# Patient Record
Sex: Female | Born: 1965 | Race: White | Hispanic: No | Marital: Married | Smoking: Never smoker
Health system: Southern US, Community
[De-identification: ages and names within clinical notes are randomized; demographics above are authoritative.]

## PROBLEM LIST (undated history)

## (undated) DIAGNOSIS — N390 Urinary tract infection, site not specified: Secondary | ICD-10-CM

## (undated) DIAGNOSIS — N8110 Cystocele, unspecified: Secondary | ICD-10-CM

## (undated) DIAGNOSIS — K5790 Diverticulosis of intestine, part unspecified, without perforation or abscess without bleeding: Secondary | ICD-10-CM

## (undated) DIAGNOSIS — I10 Essential (primary) hypertension: Secondary | ICD-10-CM

## (undated) DIAGNOSIS — N816 Rectocele: Secondary | ICD-10-CM

## (undated) DIAGNOSIS — T753XXA Motion sickness, initial encounter: Secondary | ICD-10-CM

## (undated) DIAGNOSIS — F32A Depression, unspecified: Secondary | ICD-10-CM

## (undated) DIAGNOSIS — M5134 Other intervertebral disc degeneration, thoracic region: Secondary | ICD-10-CM

## (undated) DIAGNOSIS — Z9889 Other specified postprocedural states: Secondary | ICD-10-CM

## (undated) DIAGNOSIS — R112 Nausea with vomiting, unspecified: Secondary | ICD-10-CM

## (undated) DIAGNOSIS — F419 Anxiety disorder, unspecified: Secondary | ICD-10-CM

## (undated) DIAGNOSIS — G894 Chronic pain syndrome: Secondary | ICD-10-CM

## (undated) DIAGNOSIS — D649 Anemia, unspecified: Secondary | ICD-10-CM

## (undated) DIAGNOSIS — E782 Mixed hyperlipidemia: Secondary | ICD-10-CM

## (undated) DIAGNOSIS — K219 Gastro-esophageal reflux disease without esophagitis: Secondary | ICD-10-CM

## (undated) DIAGNOSIS — M47814 Spondylosis without myelopathy or radiculopathy, thoracic region: Secondary | ICD-10-CM

## (undated) DIAGNOSIS — Z973 Presence of spectacles and contact lenses: Secondary | ICD-10-CM

## (undated) DIAGNOSIS — M47816 Spondylosis without myelopathy or radiculopathy, lumbar region: Secondary | ICD-10-CM

## (undated) HISTORY — DX: Urinary tract infection, site not specified: N39.0

## (undated) HISTORY — DX: Anxiety disorder, unspecified: F41.9

## (undated) HISTORY — PX: ABDOMINAL HYSTERECTOMY: SHX81

## (undated) HISTORY — DX: Gastro-esophageal reflux disease without esophagitis: K21.9

## (undated) HISTORY — PX: BREAST ENHANCEMENT SURGERY: SHX7

## (undated) HISTORY — DX: Diverticulosis of intestine, part unspecified, without perforation or abscess without bleeding: K57.90

---
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
## 1992-05-24 HISTORY — PX: BILATERAL CARPAL TUNNEL RELEASE: SHX6508

## 1998-11-11 ENCOUNTER — Encounter: Admission: RE | Admit: 1998-11-11 | Discharge: 1999-02-02 | Payer: Self-pay | Admitting: Anesthesiology

## 1999-01-15 ENCOUNTER — Ambulatory Visit (HOSPITAL_COMMUNITY): Admission: RE | Admit: 1999-01-15 | Discharge: 1999-01-15 | Payer: Self-pay | Admitting: *Deleted

## 1999-01-15 ENCOUNTER — Encounter: Payer: Self-pay | Admitting: *Deleted

## 1999-01-17 ENCOUNTER — Ambulatory Visit (HOSPITAL_COMMUNITY): Admission: RE | Admit: 1999-01-17 | Discharge: 1999-01-17 | Payer: Self-pay | Admitting: *Deleted

## 1999-01-17 ENCOUNTER — Encounter: Payer: Self-pay | Admitting: *Deleted

## 1999-02-02 ENCOUNTER — Ambulatory Visit (HOSPITAL_COMMUNITY): Admission: RE | Admit: 1999-02-02 | Discharge: 1999-02-02 | Payer: Self-pay | Admitting: *Deleted

## 1999-02-02 ENCOUNTER — Encounter: Payer: Self-pay | Admitting: *Deleted

## 1999-05-25 HISTORY — PX: SPINE SURGERY: SHX786

## 2000-02-22 ENCOUNTER — Encounter: Payer: Self-pay | Admitting: *Deleted

## 2000-02-24 ENCOUNTER — Encounter: Payer: Self-pay | Admitting: *Deleted

## 2000-02-24 ENCOUNTER — Ambulatory Visit (HOSPITAL_COMMUNITY): Admission: RE | Admit: 2000-02-24 | Discharge: 2000-02-24 | Payer: Self-pay | Admitting: *Deleted

## 2000-04-04 ENCOUNTER — Encounter: Payer: Self-pay | Admitting: *Deleted

## 2000-04-04 ENCOUNTER — Ambulatory Visit (HOSPITAL_COMMUNITY): Admission: RE | Admit: 2000-04-04 | Discharge: 2000-04-05 | Payer: Self-pay | Admitting: *Deleted

## 2000-04-04 ENCOUNTER — Encounter (INDEPENDENT_AMBULATORY_CARE_PROVIDER_SITE_OTHER): Payer: Self-pay | Admitting: *Deleted

## 2000-05-19 ENCOUNTER — Encounter: Payer: Self-pay | Admitting: *Deleted

## 2000-05-19 ENCOUNTER — Ambulatory Visit (HOSPITAL_COMMUNITY): Admission: RE | Admit: 2000-05-19 | Discharge: 2000-05-19 | Payer: Self-pay | Admitting: *Deleted

## 2002-05-24 HISTORY — PX: PARTIAL HYSTERECTOMY: SHX80

## 2005-05-24 HISTORY — PX: ABDOMINOPLASTY: SUR9

## 2006-05-24 HISTORY — PX: AUGMENTATION MAMMAPLASTY: SUR837

## 2008-07-31 ENCOUNTER — Ambulatory Visit (HOSPITAL_COMMUNITY): Admission: RE | Admit: 2008-07-31 | Discharge: 2008-07-31 | Payer: Self-pay | Admitting: Plastic Surgery

## 2008-07-31 ENCOUNTER — Encounter (INDEPENDENT_AMBULATORY_CARE_PROVIDER_SITE_OTHER): Payer: Self-pay | Admitting: Plastic Surgery

## 2008-07-31 ENCOUNTER — Ambulatory Visit: Payer: Self-pay | Admitting: Vascular Surgery
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
## 2011-01-22 ENCOUNTER — Inpatient Hospital Stay (HOSPITAL_COMMUNITY)
Admission: EM | Admit: 2011-01-22 | Discharge: 2011-01-26 | DRG: 603 | Disposition: A | Discharge status: Home / Self Care | Payer: Self-pay | Attending: Plastic Surgery | Admitting: Plastic Surgery

## 2011-01-22 ENCOUNTER — Emergency Department (HOSPITAL_COMMUNITY): Payer: Self-pay

## 2011-01-22 DIAGNOSIS — L0231 Cutaneous abscess of buttock: Secondary | ICD-10-CM | POA: Diagnosis present

## 2011-01-22 DIAGNOSIS — L02419 Cutaneous abscess of limb, unspecified: Principal | ICD-10-CM | POA: Diagnosis present

## 2011-01-22 LAB — BASIC METABOLIC PANEL
BUN: 10 mg/dL (ref 6–23)
CO2: 21 mEq/L (ref 19–32)
Calcium: 8.4 mg/dL (ref 8.4–10.5)
Chloride: 98 mEq/L (ref 96–112)
Creatinine, Ser: 0.66 mg/dL (ref 0.50–1.10)
GFR calc Af Amer: >60 mL/min (ref >60)
GFR calc non Af Amer: >60 mL/min (ref >60)
Glucose, Bld: 146 mg/dL — ABNORMAL HIGH (ref 70–99)
Potassium: 3.3 mEq/L — ABNORMAL LOW (ref 3.5–5.1)
Sodium: 130 mEq/L — ABNORMAL LOW (ref 135–145)

## 2011-01-22 LAB — CBC
HCT: 35.3 % — ABNORMAL LOW (ref 36.0–46.0)
Hemoglobin: 12.5 g/dL (ref 12.0–15.0)
MCH: 29.2 pg (ref 26.0–34.0)
MCHC: 35.4 g/dL (ref 30.0–36.0)
MCV: 82.5 fL (ref 78.0–100.0)
Platelets: 232 10*3/uL (ref 150–400)
RBC: 4.28 MIL/uL (ref 3.87–5.11)
RDW: 13.4 % (ref 11.5–15.5)
WBC: 13.7 10*3/uL — ABNORMAL HIGH (ref 4.0–10.5)

## 2011-01-22 LAB — URINALYSIS, ROUTINE W REFLEX MICROSCOPIC
Bilirubin Urine: NEGATIVE
Glucose, UA: NEGATIVE mg/dL
Ketones, ur: 15 mg/dL — AB
Leukocytes, UA: NEGATIVE
Nitrite: NEGATIVE
Protein, ur: 100 mg/dL — AB
Specific Gravity, Urine: 1.026 (ref 1.005–1.030)
Urobilinogen, UA: 0.2 mg/dL (ref 0.0–1.0)
pH: 6 (ref 5.0–8.0)

## 2011-01-22 LAB — URINE MICROSCOPIC-ADD ON

## 2011-01-22 LAB — DIFFERENTIAL
Basophils Absolute: 0 10*3/uL (ref 0.0–0.1)
Basophils Relative: 0 % (ref 0–1)
Eosinophils Absolute: 0 10*3/uL (ref 0.0–0.7)
Eosinophils Relative: 0 % (ref 0–5)
Lymphocytes Relative: 9 % — ABNORMAL LOW (ref 12–46)
Lymphs Abs: 1.2 10*3/uL (ref 0.7–4.0)
Monocytes Absolute: 0.4 10*3/uL (ref 0.1–1.0)
Monocytes Relative: 3 % (ref 3–12)
Neutro Abs: 12.1 10*3/uL — ABNORMAL HIGH (ref 1.7–7.7)
Neutrophils Relative %: 88 % — ABNORMAL HIGH (ref 43–77)

## 2011-01-22 MED ORDER — IOHEXOL 300 MG/ML  SOLN
100.0000 mL | Freq: Once | INTRAMUSCULAR | Status: AC | PRN
  Administered 2011-01-22: 100 mL via INTRAVENOUS

## 2011-01-23 DIAGNOSIS — L03119 Cellulitis of unspecified part of limb: Secondary | ICD-10-CM

## 2011-01-23 DIAGNOSIS — L02419 Cutaneous abscess of limb, unspecified: Secondary | ICD-10-CM

## 2011-01-29 LAB — CULTURE, BLOOD (ROUTINE X 2)
Culture  Setup Time: 201209010305
Culture  Setup Time: 201209010307
Culture: NO GROWTH
Culture: NO GROWTH

## 2012-10-20 ENCOUNTER — Ambulatory Visit: Payer: Self-pay | Admitting: Emergency Medicine

## 2012-10-20 LAB — URINALYSIS, COMPLETE
Bilirubin,UR: NEGATIVE
Glucose,UR: NEGATIVE mg/dL (ref 0–75)
Ketone: NEGATIVE
Nitrite: POSITIVE
Ph: 7 (ref 4.5–8.0)
Protein: NEGATIVE
Specific Gravity: 1.005 (ref 1.003–1.030)

## 2012-10-23 LAB — URINE CULTURE

## 2014-08-08 ENCOUNTER — Ambulatory Visit: Payer: Self-pay | Admitting: Family Medicine

## 2014-11-11 ENCOUNTER — Ambulatory Visit (INDEPENDENT_AMBULATORY_CARE_PROVIDER_SITE_OTHER): Payer: 59 | Admitting: Family Medicine

## 2014-11-11 ENCOUNTER — Encounter: Payer: Self-pay | Admitting: Family Medicine

## 2014-11-11 VITALS — BP 138/90 | HR 84 | Temp 98.1°F | Resp 18 | Ht 61.0 in | Wt 186.0 lb

## 2014-11-11 DIAGNOSIS — IMO0001 Reserved for inherently not codable concepts without codable children: Secondary | ICD-10-CM

## 2014-11-11 DIAGNOSIS — R030 Elevated blood-pressure reading, without diagnosis of hypertension: Secondary | ICD-10-CM | POA: Diagnosis not present

## 2014-11-11 DIAGNOSIS — B351 Tinea unguium: Secondary | ICD-10-CM | POA: Insufficient documentation

## 2014-11-11 DIAGNOSIS — I10 Essential (primary) hypertension: Secondary | ICD-10-CM | POA: Insufficient documentation

## 2014-11-11 MED ORDER — BLOOD PRESSURE CUFF MISC: Status: DC

## 2014-11-11 MED ORDER — CICLOPIROX 8 % EX SOLN: Freq: Every day | CUTANEOUS | Status: DC

## 2014-11-11 NOTE — Assessment & Plan Note (Signed)
Will start penlac. Continue to monitor.

## 2014-11-11 NOTE — Patient Instructions (Signed)
DASH Eating Plan DASH stands for "Dietary Approaches to Stop Hypertension." The DASH eating plan is a healthy eating plan that has been shown to reduce high blood pressure (hypertension). Additional health benefits may include reducing the risk of type 2 diabetes mellitus, heart disease, and stroke. The DASH eating plan may also help with weight loss. WHAT DO I NEED TO KNOW ABOUT THE DASH EATING PLAN? For the DASH eating plan, you will follow these general guidelines:  Choose foods with a percent daily value for sodium of less than 5% (as listed on the food label).  Use salt-free seasonings or herbs instead of table salt or sea salt.  Check with your health care provider or pharmacist before using salt substitutes.  Eat lower-sodium products, often labeled as "lower sodium" or "no salt added."  Eat fresh foods.  Eat more vegetables, fruits, and low-fat dairy products.  Choose whole grains. Look for the word "whole" as the first word in the ingredient list.  Choose fish and skinless chicken or turkey more often than red meat. Limit fish, poultry, and meat to 6 oz (170 g) each day.  Limit sweets, desserts, sugars, and sugary drinks.  Choose heart-healthy fats.  Limit cheese to 1 oz (28 g) per day.  Eat more home-cooked food and less restaurant, buffet, and fast food.  Limit fried foods.  Cook foods using methods other than frying.  Limit canned vegetables. If you do use them, rinse them well to decrease the sodium.  When eating at a restaurant, ask that your food be prepared with less salt, or no salt if possible. WHAT FOODS CAN I EAT? Seek help from a dietitian for individual calorie needs. Grains Whole grain or whole wheat bread. Brown rice. Whole grain or whole wheat pasta. Quinoa, bulgur, and whole grain cereals. Low-sodium cereals. Corn or whole wheat flour tortillas. Whole grain cornbread. Whole grain crackers. Low-sodium crackers. Vegetables Fresh or frozen vegetables  (raw, steamed, roasted, or grilled). Low-sodium or reduced-sodium tomato and vegetable juices. Low-sodium or reduced-sodium tomato sauce and paste. Low-sodium or reduced-sodium canned vegetables.  Fruits All fresh, canned (in natural juice), or frozen fruits. Meat and Other Protein Products Ground beef (85% or leaner), grass-fed beef, or beef trimmed of fat. Skinless chicken or turkey. Ground chicken or turkey. Pork trimmed of fat. All fish and seafood. Eggs. Dried beans, peas, or lentils. Unsalted nuts and seeds. Unsalted canned beans. Dairy Low-fat dairy products, such as skim or 1% milk, 2% or reduced-fat cheeses, low-fat ricotta or cottage cheese, or plain low-fat yogurt. Low-sodium or reduced-sodium cheeses. Fats and Oils Tub margarines without trans fats. Light or reduced-fat mayonnaise and salad dressings (reduced sodium). Avocado. Safflower, olive, or canola oils. Natural peanut or almond butter. Other Unsalted popcorn and pretzels. The items listed above may not be a complete list of recommended foods or beverages. Contact your dietitian for more options. WHAT FOODS ARE NOT RECOMMENDED? Grains White bread. White pasta. White rice. Refined cornbread. Bagels and croissants. Crackers that contain trans fat. Vegetables Creamed or fried vegetables. Vegetables in a cheese sauce. Regular canned vegetables. Regular canned tomato sauce and paste. Regular tomato and vegetable juices. Fruits Dried fruits. Canned fruit in light or heavy syrup. Fruit juice. Meat and Other Protein Products Fatty cuts of meat. Ribs, chicken wings, bacon, sausage, bologna, salami, chitterlings, fatback, hot dogs, bratwurst, and packaged luncheon meats. Salted nuts and seeds. Canned beans with salt. Dairy Whole or 2% milk, cream, half-and-half, and cream cheese. Whole-fat or sweetened yogurt. Full-fat   cheeses or blue cheese. Nondairy creamers and whipped toppings. Processed cheese, cheese spreads, or cheese  curds. Condiments Onion and garlic salt, seasoned salt, table salt, and sea salt. Canned and packaged gravies. Worcestershire sauce. Tartar sauce. Barbecue sauce. Teriyaki sauce. Soy sauce, including reduced sodium. Steak sauce. Fish sauce. Oyster sauce. Cocktail sauce. Horseradish. Ketchup and mustard. Meat flavorings and tenderizers. Bouillon cubes. Hot sauce. Tabasco sauce. Marinades. Taco seasonings. Relishes. Fats and Oils Butter, stick margarine, lard, shortening, ghee, and bacon fat. Coconut, palm kernel, or palm oils. Regular salad dressings. Other Pickles and olives. Salted popcorn and pretzels. The items listed above may not be a complete list of foods and beverages to avoid. Contact your dietitian for more information. WHERE CAN I FIND MORE INFORMATION? National Heart, Lung, and Blood Institute: [employer] Document Released: 04/29/2011 Document Revised: 09/24/2013 Document Reviewed: 03/14/2013 [facility] Patient Information 2015 [place], [place]. This information is not intended to replace advice given to you by your health care provider. Make sure you discuss any questions you have with your health care provider. How to Take Your Blood Pressure HOW DO I GET A BLOOD PRESSURE MACHINE?  You can buy an electronic home blood pressure machine at your local pharmacy. Insurance will sometimes cover the cost if you have a prescription.  Ask your doctor what type of machine is best for you. There are different machines for your arm and your wrist.  If you decide to buy a machine to check your blood pressure on your arm, first check the size of your arm so you can buy the right size cuff. To check the size of your arm:   Use a measuring tape that shows both inches and centimeters.   Wrap the measuring tape around the upper-middle part of your arm. You may need someone to help you measure.   Write down your arm measurement in both inches and  centimeters.   To measure your blood pressure correctly, it is important to have the right size cuff.   If your arm is up to 13 inches (up to 34 centimeters), get an adult cuff size.  If your arm is 13 to 17 inches (35 to 44 centimeters), get a large adult cuff size.    If your arm is 17 to 20 inches (45 to 52 centimeters), get an adult thigh cuff.  WHAT DO THE NUMBERS MEAN?   There are two numbers that make up your blood pressure. For example: 120/80.  The first number (120 in our example) is called the "systolic pressure." It is a measure of the pressure in your blood vessels when your heart is pumping blood.  The second number (80 in our example) is called the "diastolic pressure." It is a measure of the pressure in your blood vessels when your heart is resting between beats.  Your doctor will tell you what your blood pressure should be. WHAT SHOULD I DO BEFORE I CHECK MY BLOOD PRESSURE?   Try to rest or relax for at least 30 minutes before you check your blood pressure.  Do not smoke.  Do not have any drinks with caffeine, such as:  Soda.  Coffee.  Tea.  Check your blood pressure in a quiet room.  Sit down and stretch out your arm on a table. Keep your arm at about the level of your heart. Let your arm relax.  Make sure that your legs are not crossed. HOW DO I CHECK MY BLOOD PRESSURE?  Follow the directions that came with your  your machine. °· Make sure you remove any tight-fighting clothing from your arm or wrist. Wrap the cuff around your upper arm or wrist. You should be able to fit a finger between the cuff and your arm. If you cannot fit a finger between the cuff and your arm, it is too tight and should be removed and rewrapped. °· Some units require you to manually pump up the arm cuff. °· Automatic units inflate the cuff when you press a button. °· Cuff deflation is automatic in both models. °· After the cuff is inflated, the unit measures your blood pressure and  pulse. The readings are shown on a monitor. Hold still and breathe normally while the cuff is inflated. °· Getting a reading takes less than a minute. °· Some models store readings in a memory. Some provide a printout of readings. If your machine does not store your readings, keep a written record. °· Take readings with you to your next visit with your doctor. °Document Released: 04/22/2008 Document Revised: 09/24/2013 Document Reviewed: 07/05/2013 °[employer]® Patient Information ©2015 [employer]. This information is not intended to replace advice given to you by your health care provider. Make sure you discuss any questions you have with your health care provider. ° °

## 2014-11-11 NOTE — Assessment & Plan Note (Signed)
BP elevated again, better on recheck but not normal. PT not checking BP at home. Doesn't want to go on meds yet. Will have her start taking BP at home daily and bring in log in 1 month, if consistently elevated, will start medication. Continue to monitor.

## 2014-11-11 NOTE — Progress Notes (Signed)
   BP 138/90 mmHg  Pulse 84  Temp(Src) 98.1 F (36.7 C)  Resp 18  Ht 5\' 1"  (1.549 m)  Wt 186 lb (84.369 kg)  BMI 35.16 kg/m2   Subjective:    Patient ID: Lindsey Drake, female    DOB: 11/04/1965, 49 y.o.   MRN: [id number]  HPI: Lindsey Drake is a 49 y.o. female  Chief Complaint  Patient presents with  . Hypertension    Patient states that she feels like blood pressure is only elevated when she is at the Doctors office. Patient states that she is not checking her blood pressure at home.    Elevated BP Hypertension status: uncontrolled Satisfied with current treatment? no Duration of hypertension: unknown BP monitoring frequency:  not checking Previous BP meds:none Aspirin: no Recurrent headaches: no Visual changes: no Palpitations: no Dyspnea: no Chest pain: no Lower extremity edema: no Dizzy/lightheaded: no  Anxiety: yes  Relevant past medical, surgical, family and social history reviewed and updated as indicated. Interim medical history since our last visit reviewed. Allergies and medications reviewed and updated.  Review of Systems  Constitutional: Negative.   Respiratory: Negative.   Cardiovascular: Negative.   Psychiatric/Behavioral: Negative.     Per HPI unless specifically indicated above     Objective:    BP 138/90 mmHg  Pulse 84  Temp(Src) 98.1 F (36.7 C)  Resp 18  Ht 5\' 1"  (1.549 m)  Wt 186 lb (84.369 kg)  BMI 35.16 kg/m2 BP: 138/90 Wt Readings from Last 3 Encounters:  11/11/14 186 lb (84.369 kg)  10/08/14 184 lb (83.462 kg)    Physical Exam  Constitutional: She appears well-developed and well-nourished. No distress.  HENT:  Head: Normocephalic and atraumatic.  Eyes: Conjunctivae are normal. Pupils are equal, round, and reactive to light.  Abdominal:  Toenail fungus bilateral great toes  Neurological: She is alert.  Skin: Skin is warm and dry. She is not diaphoretic.  Psychiatric: Her behavior is normal. Judgment and thought content  normal.  Anxious  Nursing note and vitals reviewed.     Assessment & Plan:   Problem List Items Addressed This Visit      Musculoskeletal and Integument   Toenail fungus    Will start penlac. Continue to monitor.       Relevant Medications   ciclopirox (PENLAC) 8 % solution     Other   Elevated blood pressure - Primary    BP elevated again, better on recheck but not normal. PT not checking BP at home. Doesn't want to go on meds yet. Will have her start taking BP at home daily and bring in log in 1 month, if consistently elevated, will start medication. Continue to monitor.          Follow up plan: Return in about 4 weeks (around 12/09/2014) for BP recheck.

## 2014-11-24 DIAGNOSIS — R1011 Right upper quadrant pain: Secondary | ICD-10-CM | POA: Insufficient documentation

## 2014-12-09 ENCOUNTER — Ambulatory Visit (INDEPENDENT_AMBULATORY_CARE_PROVIDER_SITE_OTHER): Payer: 59 | Admitting: Family Medicine

## 2014-12-09 ENCOUNTER — Encounter: Payer: Self-pay | Admitting: Family Medicine

## 2014-12-09 VITALS — BP 150/100 | HR 87 | Temp 99.7°F | Wt 187.4 lb

## 2014-12-09 DIAGNOSIS — R030 Elevated blood-pressure reading, without diagnosis of hypertension: Secondary | ICD-10-CM | POA: Diagnosis not present

## 2014-12-09 DIAGNOSIS — F419 Anxiety disorder, unspecified: Secondary | ICD-10-CM | POA: Insufficient documentation

## 2014-12-09 DIAGNOSIS — F411 Generalized anxiety disorder: Secondary | ICD-10-CM

## 2014-12-09 DIAGNOSIS — IMO0001 Reserved for inherently not codable concepts without codable children: Secondary | ICD-10-CM

## 2014-12-09 MED ORDER — SERTRALINE HCL 50 MG PO TABS: 25.0000 mg | ORAL_TABLET | Freq: Every day | ORAL | Status: DC

## 2014-12-09 MED ORDER — CICLOPIROX 8 % EX SOLN: Freq: Every day | CUTANEOUS | Status: DC

## 2014-12-09 NOTE — Progress Notes (Signed)
BP 150/100 mmHg  Pulse 87  Temp(Src) 99.7 F (37.6 C)  Wt 187 lb 6.4 oz (85.004 kg)  SpO2 96%   Subjective:    Patient ID: Lindsey Drake, female    DOB: 11/30/1965, 49 y.o.   MRN: [id number]  HPI: Lindsey Drake is a 49 y.o. female  Chief Complaint  Patient presents with  . Hypertension    Patient has been have been getting a range of blood pressures.  . Toe Pain    Pharmacy did not recieve her script for the toenail fungus   HYPERTENSION Hypertension status: uncontrolled - very much up and down in the 120s, then up to the 140s. Monitoring closely at home.  Satisfied with current treatment? no BP monitoring frequency:  a few times a day Aspirin: no Recurrent headaches: no Visual changes: no Palpitations: no Dyspnea: no Chest pain: no Lower extremity edema: no Dizzy/lightheaded: no   ANXIETY/STRESS Duration:exacerbated Anxious mood: yes  Excessive worrying: yes Irritability: yes  Sweating: yes Nausea: no Palpitations:yes Hyperventilation: no Panic attacks: no Agoraphobia: no  Obscessions/compulsions: yes Depressed mood: yes Depression screen PHQ 2/9 12/09/2014  Decreased Interest 3  Down, Depressed, Hopeless 1  PHQ - 2 Score 4  Altered sleeping 3  Tired, decreased energy 3  Change in appetite 2  Feeling bad or failure about yourself  0  Trouble concentrating 0  Moving slowly or fidgety/restless 0  Suicidal thoughts 0  PHQ-9 Score 12  Difficult doing work/chores Very difficult    GAD7: 15 Anhedonia: no Weight changes: yes Insomnia: yes hard to stay asleep  Hypersomnia: yes Fatigue/loss of energy: yes Feelings of worthlessness: yes Feelings of guilt: yes Impaired concentration/indecisiveness: yes Suicidal ideations: no  Crying spells: yes Recent Stressors/Life Changes: yes   Relationship problems: no   Family stress: yes     Financial stress: no    Job stress: yes    Recent death/loss: no  Relevant past medical, surgical, family and social  history reviewed and updated as indicated. Interim medical history since our last visit reviewed. Allergies and medications reviewed and updated.  Review of Systems  Constitutional: Negative.   Gastrointestinal: Negative.   Psychiatric/Behavioral: Negative.     Per HPI unless specifically indicated above     Objective:    BP 150/100 mmHg  Pulse 87  Temp(Src) 99.7 F (37.6 C)  Wt 187 lb 6.4 oz (85.004 kg)  SpO2 96%  Wt Readings from Last 3 Encounters:  12/09/14 187 lb 6.4 oz (85.004 kg)  11/11/14 186 lb (84.369 kg)  10/08/14 184 lb (83.462 kg)    Physical Exam  Constitutional: She is oriented to person, place, and time. She appears well-developed and well-nourished. No distress.  HENT:  Head: Normocephalic and atraumatic.  Right Ear: Hearing normal.  Left Ear: Hearing normal.  Nose: Nose normal.  Eyes: Conjunctivae and lids are normal. Right eye exhibits no discharge. Left eye exhibits no discharge. No scleral icterus.  Cardiovascular: Normal rate and regular rhythm.  Exam reveals no gallop and no friction rub.   No murmur heard. Pulmonary/Chest: Effort normal and breath sounds normal. No respiratory distress. She has no wheezes. She has no rales. She exhibits no tenderness.  Musculoskeletal: Normal range of motion.  Neurological: She is alert and oriented to person, place, and time.  Skin: Skin is warm, dry and intact. No rash noted. No erythema. No pallor.  Psychiatric: She has a normal mood and affect. Her speech is normal and behavior is normal. Judgment  and thought content normal. Cognition and memory are normal.  Nursing note and vitals reviewed.      Assessment & Plan:   Problem List Items Addressed This Visit      Other   Elevated blood pressure - Primary    BP labile. Patient thinks that this is due to anxiety. Will continue to monitor BP at home. Will treat anxiety. If BP remains elevated, will treat it.       Anxiety disorder    Really not doing well.  Will start zoloft. Will check back in in 1 month. Hoping BP will drop with better control of anxiety/depression          Follow up plan: Return in about 4 weeks (around 01/06/2015) for follow up anxiety.

## 2014-12-09 NOTE — Assessment & Plan Note (Signed)
BP labile. Patient thinks that this is due to anxiety. Will continue to monitor BP at home. Will treat anxiety. If BP remains elevated, will treat it.

## 2014-12-09 NOTE — Assessment & Plan Note (Signed)
Really not doing well. Will start zoloft. Will check back in in 1 month. Hoping BP will drop with better control of anxiety/depression

## 2014-12-09 NOTE — Patient Instructions (Signed)
Sertraline tablets What is this medicine? SERTRALINE (SER tra leen) is used to treat depression. It may also be used to treat obsessive compulsive disorder, panic disorder, post-trauma stress, premenstrual dysphoric disorder (PMDD) or social anxiety. This medicine may be used for other purposes; ask your health care provider or pharmacist if you have questions. COMMON BRAND NAME(S): Zoloft What should I tell my health care provider before I take this medicine? They need to know if you have any of these conditions: -bipolar disorder or a family history of bipolar disorder -diabetes -glaucoma -heart disease -high blood pressure -history of irregular heartbeat -history of low levels of calcium, magnesium, or potassium in the blood -if you often drink alcohol -liver disease -receiving electroconvulsive therapy -seizures -suicidal thoughts, plans, or attempt; a previous suicide attempt by you or a family member -thyroid disease -an unusual or allergic reaction to sertraline, other medicines, foods, dyes, or preservatives -pregnant or trying to get pregnant -breast-feeding How should I use this medicine? Take this medicine by mouth with a glass of water. Follow the directions on the prescription label. You can take it with or without food. Take your medicine at regular intervals. Do not take your medicine more often than directed. Do not stop taking this medicine suddenly except upon the advice of your doctor. Stopping this medicine too quickly may cause serious side effects or your condition may worsen. A special MedGuide will be given to you by the pharmacist with each prescription and refill. Be sure to read this information carefully each time. Talk to your pediatrician regarding the use of this medicine in children. While this drug may be prescribed for children as young as 7 years for selected conditions, precautions do apply. Overdosage: If you think you have taken too much of this  medicine contact a poison control center or emergency room at once. NOTE: This medicine is only for you. Do not share this medicine with others. What if I miss a dose? If you miss a dose, take it as soon as you can. If it is almost time for your next dose, take only that dose. Do not take double or extra doses. What may interact with this medicine? Do not take this medicine with any of the following medications: -certain medicines for fungal infections like fluconazole, itraconazole, ketoconazole, posaconazole, voriconazole -cisapride -disulfiram -dofetilide -linezolid -MAOIs like Carbex, Eldepryl, Marplan, Nardil, and Parnate -metronidazole -methylene blue (injected into a vein) -pimozide -thioridazine -ziprasidone This medicine may also interact with the following medications: -alcohol -aspirin and aspirin-like medicines -certain medicines for depression, anxiety, or psychotic disturbances -certain medicines for irregular heart beat like flecainide, propafenone -certain medicines for migraine headaches like almotriptan, eletriptan, frovatriptan, naratriptan, rizatriptan, sumatriptan, zolmitriptan -certain medicines for sleep -certain medicines for seizures like carbamazepine, valproic acid, phenytoin -certain medicines that treat or prevent blood clots like warfarin, enoxaparin, dalteparin -cimetidine -digoxin -diuretics -fentanyl -furazolidone -isoniazid -lithium -NSAIDs, medicines for pain and inflammation, like ibuprofen or naproxen -other medicines that prolong the QT interval (cause an abnormal heart rhythm) -procarbazine -rasagiline -supplements like St. John's wort, kava kava, valerian -tolbutamide -tramadol -tryptophan This list may not describe all possible interactions. Give your health care provider a list of all the medicines, herbs, non-prescription drugs, or dietary supplements you use. Also tell them if you smoke, drink alcohol, or use illegal drugs. Some  items may interact with your medicine. What should I watch for while using this medicine? Tell your doctor if your symptoms do not get better or if they get   worse. Visit your doctor or health care professional for regular checks on your progress. Because it may take several weeks to see the full effects of this medicine, it is important to continue your treatment as prescribed by your doctor. Patients and their families should watch out for new or worsening thoughts of suicide or depression. Also watch out for sudden changes in feelings such as feeling anxious, agitated, panicky, irritable, hostile, aggressive, impulsive, severely restless, overly excited and hyperactive, or not being able to sleep. If this happens, especially at the beginning of treatment or after a change in dose, call your health care professional. You may get drowsy or dizzy. Do not drive, use machinery, or do anything that needs mental alertness until you know how this medicine affects you. Do not stand or sit up quickly, especially if you are an older patient. This reduces the risk of dizzy or fainting spells. Alcohol may interfere with the effect of this medicine. Avoid alcoholic drinks. Your mouth may get dry. Chewing sugarless gum or sucking hard candy, and drinking plenty of water may help. Contact your doctor if the problem does not go away or is severe. What side effects may I notice from receiving this medicine? Side effects that you should report to your doctor or health care professional as soon as possible: -allergic reactions like skin rash, itching or hives, swelling of the face, lips, or tongue -black or bloody stools, blood in the urine or vomit -fast, irregular heartbeat -feeling faint or lightheaded, falls -hallucination, loss of contact with reality -seizures -suicidal thoughts or other mood changes -unusual bleeding or bruising -unusually weak or tired -vomiting Side effects that usually do not require  medical attention (report to your doctor or health care professional if they continue or are bothersome): -change in appetite -change in sex drive or performance -diarrhea -increased sweating -indigestion, nausea -tremors This list may not describe all possible side effects. Call your doctor for medical advice about side effects. You may report side effects to FDA at 1-800-FDA-1088. Where should I keep my medicine? Keep out of the reach of children. Store at room temperature between 15 and 30 degrees C (59 and 86 degrees F). Throw away any unused medicine after the expiration date. NOTE: This sheet is a summary. It may not cover all possible information. If you have questions about this medicine, talk to your doctor, pharmacist, or health care provider.  2015, [employer]. (2012-12-05 12:57:35)  

## 2015-01-06 ENCOUNTER — Encounter: Payer: Self-pay | Admitting: Family Medicine

## 2015-01-06 ENCOUNTER — Ambulatory Visit (INDEPENDENT_AMBULATORY_CARE_PROVIDER_SITE_OTHER): Payer: 59 | Admitting: Family Medicine

## 2015-01-06 VITALS — BP 138/86 | HR 85 | Temp 99.8°F | Wt 181.4 lb

## 2015-01-06 DIAGNOSIS — F411 Generalized anxiety disorder: Secondary | ICD-10-CM | POA: Diagnosis not present

## 2015-01-06 DIAGNOSIS — I10 Essential (primary) hypertension: Secondary | ICD-10-CM | POA: Diagnosis not present

## 2015-01-06 MED ORDER — LISINOPRIL 5 MG PO TABS: 5.0000 mg | ORAL_TABLET | Freq: Every day | ORAL | Status: DC

## 2015-01-06 MED ORDER — SERTRALINE HCL 50 MG PO TABS: 50.0000 mg | ORAL_TABLET | Freq: Every day | ORAL | Status: DC

## 2015-01-06 NOTE — Patient Instructions (Signed)
Hypertension °Hypertension is another name for high blood pressure. High blood pressure forces your heart to work harder to pump blood. A blood pressure reading has two numbers, which includes a higher number over a lower number (example: 110/72). °HOME CARE  °· Have your blood pressure rechecked by your doctor. °· Only take medicine as told by your doctor. Follow the directions carefully. The medicine does not work as well if you skip doses. Skipping doses also puts you at risk for problems. °· Do not smoke. °· Monitor your blood pressure at home as told by your doctor. °GET HELP IF: °· You think you are having a reaction to the medicine you are taking. °· You have repeat headaches or feel dizzy. °· You have puffiness (swelling) in your ankles. °· You have trouble with your vision. °GET HELP RIGHT AWAY IF:  °· You get a very bad headache and are confused. °· You feel weak, numb, or faint. °· You get chest or belly (abdominal) pain. °· You throw up (vomit). °· You cannot breathe very well. °MAKE SURE YOU:  °· Understand these instructions. °· Will watch your condition. °· Will get help right away if you are not doing well or get worse. °Document Released: 10/27/2007 Document Revised: 05/15/2013 Document Reviewed: 03/02/2013 °ExitCare® Patient Information ©2015 [employer]. This information is not intended to replace advice given to you by your health care provider. Make sure you discuss any questions you have with your health care provider. ° °Managing Your High Blood Pressure °Blood pressure is a measurement of how forceful your blood is pressing against the walls of the arteries. Arteries are muscular tubes within the circulatory system. Blood pressure does not stay the same. Blood pressure rises when you are active, excited, or nervous; and it lowers during sleep and relaxation. If the numbers measuring your blood pressure stay above normal most of the time, you are at risk for health problems. High blood  pressure (hypertension) is a long-term (chronic) condition in which blood pressure is elevated. °A blood pressure reading is recorded as two numbers, such as 120 over 80 (or 120/80). The first, higher number is called the systolic pressure. It is a measure of the pressure in your arteries as the heart beats. The second, lower number is called the diastolic pressure. It is a measure of the pressure in your arteries as the heart relaxes between beats.  °Keeping your blood pressure in a normal range is important to your overall health and prevention of health problems, such as heart disease and stroke. When your blood pressure is uncontrolled, your heart has to work harder than normal. High blood pressure is a very common condition in adults because blood pressure tends to rise with age. Men and women are equally likely to have hypertension but at different times in life. Before age 45, men are more likely to have hypertension. After 50 years of age, women are more likely to have it. Hypertension is especially common in African Americans. This condition often has no signs or symptoms. The cause of the condition is usually not known. Your caregiver can help you come up with a plan to keep your blood pressure in a normal, healthy range. °BLOOD PRESSURE STAGES °Blood pressure is classified into four stages: normal, prehypertension, stage 1, and stage 2. Your blood pressure reading will be used to determine what type of treatment, if any, is necessary. Appropriate treatment options are tied to these four stages:  °Normal °· Systolic pressure (mm Hg):   below 120. °· Diastolic pressure (mm Hg): below 80. °Prehypertension °· Systolic pressure (mm Hg): 120 to 139. °· Diastolic pressure (mm Hg): 80 to 89. °Stage 1 °· Systolic pressure (mm Hg): 140 to 159. °· Diastolic pressure (mm Hg): 90 to 99. °Stage 2 °· Systolic pressure (mm Hg): 160 or above. °· Diastolic pressure (mm Hg): 100 or above. °RISKS RELATED TO HIGH BLOOD  PRESSURE °Managing your blood pressure is an important responsibility. Uncontrolled high blood pressure can lead to: °· A heart attack. °· A stroke. °· A weakened blood vessel (aneurysm). °· Heart failure. °· Kidney damage. °· Eye damage. °· Metabolic syndrome. °· Memory and concentration problems. °HOW TO MANAGE YOUR BLOOD PRESSURE °Blood pressure can be managed effectively with lifestyle changes and medicines (if needed). Your caregiver will help you come up with a plan to bring your blood pressure within a normal range. Your plan should include the following: °Education °· Read all information provided by your caregivers about how to control blood pressure. °· Educate yourself on the latest guidelines and treatment recommendations. New research is always being done to further define the risks and treatments for high blood pressure. °Lifestyle changes °· Control your weight. °· Avoid smoking. °· Stay physically active. °· Reduce the amount of salt in your diet. °· Reduce stress. °· Control any chronic conditions, such as high cholesterol or diabetes. °· Reduce your alcohol intake. °Medicines °· Several medicines (antihypertensive medicines) are available, if needed, to bring blood pressure within a normal range. °Communication °· Review all the medicines you take with your caregiver because there may be side effects or interactions. °· Talk with your caregiver about your diet, exercise habits, and other lifestyle factors that may be contributing to high blood pressure. °· See your caregiver regularly. Your caregiver can help you create and adjust your plan for managing high blood pressure. °RECOMMENDATIONS FOR TREATMENT AND FOLLOW-UP  °The following recommendations are based on current guidelines for managing high blood pressure in nonpregnant adults. Use these recommendations to identify the proper follow-up period or treatment option based on your blood pressure reading. You can discuss these options with your  caregiver. °· Systolic pressure of 120 to 139 or diastolic pressure of 80 to 89: Follow up with your caregiver as directed. °· Systolic pressure of 140 to 160 or diastolic pressure of 90 to 100: Follow up with your caregiver within 2 months. °· Systolic pressure above 160 or diastolic pressure above 100: Follow up with your caregiver within 1 month. °· Systolic pressure above 180 or diastolic pressure above 110: Consider antihypertensive therapy; follow up with your caregiver within 1 week. °· Systolic pressure above 200 or diastolic pressure above 120: Begin antihypertensive therapy; follow up with your caregiver within 1 week. °Document Released: 02/02/2012 Document Reviewed: 02/02/2012 °ExitCare® Patient Information ©2015 [employer]. This information is not intended to replace advice given to you by your health care provider. Make sure you discuss any questions you have with your health care provider. ° °

## 2015-01-06 NOTE — Progress Notes (Signed)
BP 138/86 mmHg  Pulse 85  Temp(Src) 99.8 F (37.7 C)  Wt 181 lb 6.4 oz (82.283 kg)  SpO2 95%   Subjective:    Patient ID: Lindsey Drake, female    DOB: Sep 22, 1965, 49 y.o.   MRN: [id number]  HPI: Lindsey Drake is a 49 y.o. female  Chief Complaint  Patient presents with  . Anxiety  . Hypertension    patient has a list of her recent bp readings   ANXIETY/STRESS Duration:uncontrolled Anxious mood: yes  Excessive worrying: yes Irritability: yes  Sweating: yes Nausea: yes Palpitations:yes Hyperventilation: yes Panic attacks: no Agoraphobia: no  Obscessions/compulsions: no Depressed mood: yes  GAD7: 14 Depression screen PHQ 2/9 12/09/2014  Decreased Interest 3  Down, Depressed, Hopeless 1  PHQ - 2 Score 4  Altered sleeping 3  Tired, decreased energy 3  Change in appetite 2  Feeling bad or failure about yourself  0  Trouble concentrating 0  Moving slowly or fidgety/restless 0  Suicidal thoughts 0  PHQ-9 Score 12  Difficult doing work/chores Very difficult   Anhedonia: no Weight changes: no Insomnia: no   Hypersomnia: no Fatigue/loss of energy: yes Feelings of worthlessness: no Feelings of guilt: no Impaired concentration/indecisiveness: no Suicidal ideations: no  Crying spells: no  Elevated BP Duration of Elevated BP: several months: BP monitoring frequency:  a few times a day BP range: 119-189, usually in the 140s-150s Previous BP meds:none Aspirin: no Recurrent headaches: no Visual changes: no Palpitations: no Dyspnea: no Chest pain: no Lower extremity edema: no Dizzy/lightheaded: no  Relevant past medical, surgical, family and social history reviewed and updated as indicated. Interim medical history since our last visit reviewed. Allergies and medications reviewed and updated.  Review of Systems  Constitutional: Negative.   Respiratory: Negative.   Cardiovascular: Negative.   Musculoskeletal: Negative.   Psychiatric/Behavioral: Negative.      Per HPI unless specifically indicated above     Objective:    BP 138/86 mmHg  Pulse 85  Temp(Src) 99.8 F (37.7 C)  Wt 181 lb 6.4 oz (82.283 kg)  SpO2 95%  Wt Readings from Last 3 Encounters:  01/06/15 181 lb 6.4 oz (82.283 kg)  12/09/14 187 lb 6.4 oz (85.004 kg)  11/11/14 186 lb (84.369 kg)    Physical Exam  Constitutional: She is oriented to person, place, and time. She appears well-developed and well-nourished. No distress.  HENT:  Head: Normocephalic and atraumatic.  Right Ear: Hearing normal.  Left Ear: Hearing normal.  Nose: Nose normal.  Eyes: Conjunctivae and lids are normal. Right eye exhibits no discharge. Left eye exhibits no discharge. No scleral icterus.  Cardiovascular: Normal rate, regular rhythm and normal heart sounds.  Exam reveals no gallop and no friction rub.   No murmur heard. Pulmonary/Chest: Effort normal and breath sounds normal. No respiratory distress. She has no wheezes. She has no rales. She exhibits no tenderness.  Musculoskeletal: Normal range of motion.  Neurological: She is alert and oriented to person, place, and time.  Skin: Skin is warm, dry and intact. No rash noted. No erythema. No pallor.  Psychiatric: She has a normal mood and affect. Her speech is normal and behavior is normal. Judgment and thought content normal. Cognition and memory are normal.  Nursing note and vitals reviewed.       Assessment & Plan:   Problem List Items Addressed This Visit      Cardiovascular and Mediastinum   Essential hypertension, benign    BP elevated almost  all the time. Will start low dose lisinopril for renal protection and BP control and recheck in 1 month with microalbumin and BMP.      Relevant Medications   lisinopril (PRINIVIL,ZESTRIL) 5 MG tablet   Other Relevant Orders   Basic metabolic panel   Microalbumin, Urine Waived     Other   Anxiety disorder - Primary    Not under good control. Will increase to  daily, can increase to   if needed after 2 weeks. Will recheck in 1 month.           Follow up plan: Return in about 4 weeks (around 02/03/2015).

## 2015-01-06 NOTE — Assessment & Plan Note (Signed)
Not under good control. Will increase to  daily, can increase to  if needed after 2 weeks. Will recheck in 1 month.

## 2015-01-06 NOTE — Assessment & Plan Note (Signed)
BP elevated almost all the time. Will start low dose lisinopril for renal protection and BP control and recheck in 1 month with microalbumin and BMP.

## 2015-01-14 HISTORY — PX: UPPER GI ENDOSCOPY: SHX6162

## 2015-01-23 ENCOUNTER — Other Ambulatory Visit: Payer: Self-pay | Admitting: Unknown Physician Specialty

## 2015-01-23 DIAGNOSIS — R1084 Generalized abdominal pain: Secondary | ICD-10-CM

## 2015-01-29 ENCOUNTER — Ambulatory Visit
Admission: RE | Admit: 2015-01-29 | Discharge: 2015-01-29 | Disposition: A | Discharge status: Home / Self Care | Payer: 59 | Source: Ambulatory Visit | Attending: Unknown Physician Specialty | Admitting: Unknown Physician Specialty

## 2015-01-29 DIAGNOSIS — R1084 Generalized abdominal pain: Secondary | ICD-10-CM | POA: Diagnosis present

## 2015-01-29 DIAGNOSIS — K5730 Diverticulosis of large intestine without perforation or abscess without bleeding: Secondary | ICD-10-CM | POA: Insufficient documentation

## 2015-01-29 HISTORY — DX: Essential (primary) hypertension: I10

## 2015-01-29 MED ORDER — IOHEXOL 300 MG/ML  SOLN
100.0000 mL | Freq: Once | INTRAMUSCULAR | Status: AC | PRN
  Administered 2015-01-29: 100 mL via INTRAVENOUS

## 2015-02-06 ENCOUNTER — Ambulatory Visit: Payer: 59 | Admitting: Family Medicine

## 2015-02-07 ENCOUNTER — Other Ambulatory Visit: Payer: Self-pay | Admitting: Unknown Physician Specialty

## 2015-02-07 DIAGNOSIS — R1011 Right upper quadrant pain: Secondary | ICD-10-CM

## 2015-02-12 ENCOUNTER — Ambulatory Visit
Admission: RE | Admit: 2015-02-12 | Discharge: 2015-02-12 | Disposition: A | Discharge status: Home / Self Care | Payer: 59 | Source: Ambulatory Visit | Attending: Unknown Physician Specialty | Admitting: Unknown Physician Specialty

## 2015-02-12 DIAGNOSIS — R1011 Right upper quadrant pain: Secondary | ICD-10-CM | POA: Diagnosis not present

## 2015-02-12 MED ORDER — TECHNETIUM TC 99M MEBROFENIN IV KIT
5.2500 | PACK | Freq: Once | INTRAVENOUS | Status: DC | PRN
  Administered 2015-02-12: 5.25 via INTRAVENOUS
  Filled 2015-02-12: qty 6

## 2015-04-14 ENCOUNTER — Encounter: Payer: Self-pay | Admitting: Family Medicine

## 2015-04-27 ENCOUNTER — Other Ambulatory Visit: Payer: Self-pay | Admitting: Family Medicine

## 2015-04-28 NOTE — Telephone Encounter (Signed)
Left voicemail for patient to call and schedule an appointment, once this is done enough medication will be sent to the pharmacy.

## 2015-04-28 NOTE — Telephone Encounter (Signed)
Needs an appointment. Will get him enough medicine to make it to appointment when it's booked.   

## 2015-05-05 NOTE — Telephone Encounter (Signed)
Voicemail left for patient to call and schedule an appointment.

## 2015-05-06 HISTORY — PX: COLONOSCOPY: SHX174

## 2015-05-20 ENCOUNTER — Encounter: Payer: Self-pay | Admitting: Family Medicine

## 2015-05-20 ENCOUNTER — Ambulatory Visit (INDEPENDENT_AMBULATORY_CARE_PROVIDER_SITE_OTHER): Payer: 59 | Admitting: Family Medicine

## 2015-05-20 VITALS — BP 144/92 | HR 103 | Temp 99.3°F | Ht 61.6 in | Wt 180.0 lb

## 2015-05-20 DIAGNOSIS — I10 Essential (primary) hypertension: Secondary | ICD-10-CM | POA: Diagnosis not present

## 2015-05-20 DIAGNOSIS — Z23 Encounter for immunization: Secondary | ICD-10-CM

## 2015-05-20 DIAGNOSIS — F411 Generalized anxiety disorder: Secondary | ICD-10-CM

## 2015-05-20 MED ORDER — SERTRALINE HCL 100 MG PO TABS: 50.0000 mg | ORAL_TABLET | Freq: Every day | ORAL | Status: DC

## 2015-05-20 MED ORDER — LISINOPRIL 5 MG PO TABS: 5.0000 mg | ORAL_TABLET | Freq: Every day | ORAL | Status: DC

## 2015-05-20 NOTE — Assessment & Plan Note (Signed)
Not under good control. Acting up and getting worse. Will increase to 100mg  daily and will check back in in 3 weeks, if not doing better, will increase to 150

## 2015-05-20 NOTE — Progress Notes (Signed)
BP 144/92 mmHg  Pulse 103  Temp(Src) 99.3 F (37.4 C)  Ht 5' 1.6" (1.565 m)  Wt 180 lb (81.647 kg)  BMI 33.34 kg/m2  SpO2 95%   Subjective:    Patient ID: Lindsey Drake Postlewait, female    DOB: 10/16/1965, 49 y.o.   MRN: [id number]  HPI: Lindsey Drake Delio is a 49 y.o. female  Chief Complaint  Patient presents with  . Hypertension    Patient states that she has been out of her medication for a week.   HYPERTENSION- has been out of her medicine for about a week Hypertension status: uncontrolled  Satisfied with current treatment? no Duration of hypertension: chronic BP monitoring frequency:  weekly BP medication side effects:  no Medication compliance: fair compliance Aspirin: no Recurrent headaches: no Visual changes: no Palpitations: no Dyspnea: no Chest pain: no Lower extremity edema: no Dizzy/lightheaded: no  ANXIETY/STRESS- just taking the 50mg  Duration:uncontrolled Anxious mood: yes  Excessive worrying: yes Irritability: yes  Sweating: yes Nausea: yes Palpitations:yes Hyperventilation: no Panic attacks: no Agoraphobia: no  Obscessions/compulsions: no Depressed mood: yes [facility] 2[facility] 05/20/2015 12/09/2014  Decreased Interest 3 3  Down, Depressed, Hopeless 2 1  PHQ - 2 Score 5 4  Altered sleeping 3 3  Tired, decreased energy 3 3  Change in appetite 3 2  Feeling bad or failure about yourself  0 0  Trouble concentrating 0 0  Moving slowly or fidgety/restless 0 0  Suicidal thoughts 0 0  PHQ-9 Score 14 12  Difficult doing work/chores Very difficult Very difficult   Anhedonia: no Weight changes: no Insomnia: yes hard to stay asleep  Hypersomnia: no Fatigue/loss of energy: yes Feelings of worthlessness: yes Feelings of guilt: yes Impaired concentration/indecisiveness: yes Suicidal ideations: no  Crying spells: yes Recent Stressors/Life Changes: yes  Relevant past medical, surgical, family and social history reviewed and updated as indicated.  Interim medical history since our last visit reviewed. Allergies and medications reviewed and updated.  Review of Systems  Constitutional: Negative.   Respiratory: Negative.   Cardiovascular: Negative.   Gastrointestinal: Positive for nausea, abdominal pain and abdominal distention. Negative for vomiting, blood in stool, anal bleeding and rectal pain.  Psychiatric/Behavioral: Positive for sleep disturbance, dysphoric mood and decreased concentration. Negative for suicidal ideas, hallucinations, behavioral problems, confusion, self-injury and agitation. The patient is nervous/anxious. The patient is not hyperactive.     Per HPI unless specifically indicated above     Objective:    BP 144/92 mmHg  Pulse 103  Temp(Src) 99.3 F (37.4 C)  Ht 5' 1.6" (1.565 m)  Wt 180 lb (81.647 kg)  BMI 33.34 kg/m2  SpO2 95%  Wt Readings from Last 3 Encounters:  05/20/15 180 lb (81.647 kg)  02/12/15 181 lb (82.101 kg)  01/06/15 181 lb 6.4 oz (82.283 kg)    Physical Exam  Constitutional: She is oriented to person, place, and time. She appears well-developed and well-nourished. No distress.  HENT:  Head: Normocephalic and atraumatic.  Right Ear: Hearing normal.  Left Ear: Hearing normal.  Nose: Nose normal.  Eyes: Conjunctivae and lids are normal. Right eye exhibits no discharge. Left eye exhibits no discharge. No scleral icterus.  Cardiovascular: Normal rate, regular rhythm, normal heart sounds and intact distal pulses.  Exam reveals no gallop and no friction rub.   No murmur heard. Pulmonary/Chest: Effort normal and breath sounds normal. No respiratory distress. She has no wheezes. She has no rales. She exhibits no tenderness.  Musculoskeletal: Normal range of  motion.  Neurological: She is alert and oriented to person, place, and time.  Skin: Skin is warm, dry and intact. No rash noted. No erythema. No pallor.  Psychiatric: She has a normal mood and affect. Her speech is normal and behavior is  normal. Judgment and thought content normal. Cognition and memory are normal.  Nursing note and vitals reviewed.      Assessment & Plan:   Problem List Items Addressed This Visit      Cardiovascular and Mediastinum   Essential hypertension, benign    Not under good control. Has been off her medicine for a week, will restart her medication and will recheck in 1 month.       Relevant Medications   lisinopril (PRINIVIL,ZESTRIL) 5 MG tablet     Other   Anxiety disorder - Primary    Not under good control. Acting up and getting worse. Will increase to  daily and will check back in in 3 weeks, if not doing better, will increase to 150       Other Visit Diagnoses    Immunization due        Tdap and flu given today    Relevant Orders    Tdap vaccine greater than or equal to 7yo IM (Completed)    Flu Vaccine QUAD 36+ mos PF IM (Fluarix & Fluzone Quad PF) (Completed)        Follow up plan: Return in about 3 weeks (around 06/10/2015).

## 2015-05-20 NOTE — Assessment & Plan Note (Signed)
Not under good control. Has been off her medicine for a week, will restart her medication and will recheck in 1 month.

## 2015-06-10 ENCOUNTER — Encounter: Payer: Self-pay | Admitting: Family Medicine

## 2015-06-10 ENCOUNTER — Ambulatory Visit (INDEPENDENT_AMBULATORY_CARE_PROVIDER_SITE_OTHER): Payer: BLUE CROSS/BLUE SHIELD | Admitting: Family Medicine

## 2015-06-10 VITALS — BP 124/85 | HR 94 | Temp 98.9°F | Ht 61.7 in | Wt 177.0 lb

## 2015-06-10 DIAGNOSIS — F411 Generalized anxiety disorder: Secondary | ICD-10-CM

## 2015-06-10 DIAGNOSIS — I10 Essential (primary) hypertension: Secondary | ICD-10-CM | POA: Diagnosis not present

## 2015-06-10 DIAGNOSIS — G4700 Insomnia, unspecified: Secondary | ICD-10-CM | POA: Diagnosis not present

## 2015-06-10 MED ORDER — TRAZODONE HCL 50 MG PO TABS: 25.0000 mg | ORAL_TABLET | Freq: Every evening | ORAL | Status: DC | PRN

## 2015-06-10 NOTE — Assessment & Plan Note (Signed)
Will start trazodone and see how she's doing. If better with increased sleep, will continue current regimen, if not will decrease zoloft.

## 2015-06-10 NOTE — Assessment & Plan Note (Signed)
Under great control. Continue current regimen and continue to monitor.

## 2015-06-10 NOTE — Assessment & Plan Note (Signed)
Better, but now feeling lazy and not feeling well. Will start trazodone and see how she's doing. If better with increased sleep, will continue current regimen, if not will decrease zoloft.

## 2015-06-10 NOTE — Progress Notes (Signed)
BP 124/85 mmHg  Pulse 94  Temp(Src) 98.9 F (37.2 C)  Ht 5' 1.7" (1.567 m)  Wt 177 lb (80.287 kg)  BMI 32.70 kg/m2  SpO2 97%   Subjective:    Patient ID: Lindsey Drake, female    DOB: [birth year], 50 y.o.   MRN: [id number]  HPI: Lindsey Drake is a 50 y.o. female  Chief Complaint  Patient presents with  . Anxiety  . Hypertension   ANXIETY/STRESS- feeling better, Feeling calmer, but doesn't want to do anything. Still not sleeping. Feeling more lazy and tired. Not sure if it's the medicine or something else Duration:better Anxious mood: yes  Excessive worrying: yes Irritability: yes  Sweating: no Nausea: no Palpitations:no Hyperventilation: no Panic attacks: no Agoraphobia: no  Obscessions/compulsions: no Depressed mood: no Depression screen [facility] 2/9 05/20/2015 12/09/2014  Decreased Interest 3 3  Down, Depressed, Hopeless 2 1  PHQ - 2 Score 5 4  Altered sleeping 3 3  Tired, decreased energy 3 3  Change in appetite 3 2  Feeling bad or failure about yourself  0 0  Trouble concentrating 0 0  Moving slowly or fidgety/restless 0 0  Suicidal thoughts 0 0  PHQ-9 Score 14 12  Difficult doing work/chores Very difficult Very difficult   GAD 7 : Generalized Anxiety Score 06/10/2015 06/10/2015 05/20/2015  Nervous, Anxious, on Edge Control/stop worrying Worry too much - different things Trouble relaxing Restless 0 0 0  Easily annoyed or irritable Afraid - awful might happen 0 0 0  Total GAD 7 Score Anxiety Difficulty Somewhat difficult Somewhat difficult Extremely difficult   Anhedonia: no Weight changes: no Insomnia: yes hard to stay asleep  Hypersomnia: no Fatigue/loss of energy: yes Feelings of worthlessness: no Feelings of guilt: no Impaired concentration/indecisiveness: no Suicidal ideations: no  Crying spells: no Recent Stressors/Life Changes: no  HYPERTENSION Hypertension status: better  Satisfied with current  treatment? yes Duration of hypertension: newly diagnosed BP monitoring frequency:  not checking BP medication side effects:  no Medication compliance: excellent compliance Aspirin: no Recurrent headaches: no Visual changes: no Palpitations: no Dyspnea: no Chest pain: no Lower extremity edema: no Dizzy/lightheaded: no   Relevant past medical, surgical, family and social history reviewed and updated as indicated. Interim medical history since our last visit reviewed. Allergies and medications reviewed and updated.  Review of Systems  Constitutional: Negative.   Respiratory: Negative.   Cardiovascular: Negative.   Gastrointestinal: Negative.   Psychiatric/Behavioral: Negative.     Per HPI unless specifically indicated above     Objective:    BP 124/85 mmHg  Pulse 94  Temp(Src) 98.9 F (37.2 C)  Ht 5' 1.7" (1.567 m)  Wt 177 lb (80.287 kg)  BMI 32.70 kg/m2  SpO2 97%  Wt Readings from Last 3 Encounters:  06/10/15 177 lb (80.287 kg)  05/20/15 180 lb (81.647 kg)  02/12/15 181 lb (82.101 kg)    Physical Exam  Constitutional: She is oriented to person, place, and time. She appears well-developed and well-nourished. No distress.  HENT:  Head: Normocephalic and atraumatic.  Right Ear: Hearing normal.  Left Ear: Hearing normal.  Nose: Nose normal.  Eyes: Conjunctivae and lids are normal. Right eye exhibits no discharge. Left eye exhibits no discharge. No scleral icterus.  Cardiovascular: Normal rate, regular rhythm, normal heart sounds and intact distal pulses.   Pulmonary/Chest: Effort normal  and breath sounds normal. No respiratory distress. She has no wheezes. She has no rales. She exhibits no tenderness.  Musculoskeletal: Normal range of motion.  Neurological: She is alert and oriented to person, place, and time.  Skin: Skin is warm, dry and intact. No rash noted. No erythema. No pallor.  Psychiatric: She has a normal mood and affect. Her speech is normal and behavior  is normal. Judgment and thought content normal. Cognition and memory are normal.  Nursing note and vitals reviewed.       Assessment & Plan:   Problem List Items Addressed This Visit      Cardiovascular and Mediastinum   Essential hypertension, benign - Primary    Under great control. Continue current regimen and continue to monitor.         Other   Anxiety disorder    Better, but now feeling lazy and not feeling well. Will start trazodone and see how she's doing. If better with increased sleep, will continue current regimen, if not will decrease zoloft.      Insomnia    Will start trazodone and see how she's doing. If better with increased sleep, will continue current regimen, if not will decrease zoloft.          Follow up plan: Return in about 4 weeks (around 07/08/2015).

## 2015-06-24 ENCOUNTER — Telehealth: Payer: Self-pay | Admitting: Family Medicine

## 2015-06-24 NOTE — Telephone Encounter (Signed)
Called and checked in on [place]. She is feeling better. Still not sleeping through the night, but doing much better. Feeling much less lazy and more like herself. We will keep on current regimen and check back in in 2 weeks at follow up.

## 2015-06-24 NOTE — Telephone Encounter (Signed)
-----   Message from Dorcas Carrow, DO sent at 06/10/2015  8:43 PM EST ----- Call about anxiety and feeling lazy

## 2015-08-11 ENCOUNTER — Ambulatory Visit (INDEPENDENT_AMBULATORY_CARE_PROVIDER_SITE_OTHER): Payer: BLUE CROSS/BLUE SHIELD | Admitting: Gastroenterology

## 2015-08-11 ENCOUNTER — Encounter: Payer: Self-pay | Admitting: Gastroenterology

## 2015-08-11 VITALS — BP 138/95 | HR 108 | Temp 98.3°F | Ht 61.0 in | Wt 178.0 lb

## 2015-08-11 DIAGNOSIS — R109 Unspecified abdominal pain: Secondary | ICD-10-CM

## 2015-08-11 MED ORDER — DICYCLOMINE HCL 20 MG PO TABS
20.0000 mg | ORAL_TABLET | Freq: Three times a day (TID) | ORAL | Status: DC
Start: 2015-08-11 — End: 2015-09-30

## 2015-08-11 NOTE — Progress Notes (Signed)
Gastroenterology Consultation  Referring Provider:     Dorcas CarrowJohnson, Megan P, DO Primary Care Physician:  Olevia PerchesMegan Johnson, DO Primary Gastroenterologist:  Dr. Servando SnareWohl     Reason for Consultation:     Abdominal pain        HPI:   Lindsey Drake is a 50 y.o. y/o female referred for consultation & management of Abdominal pain by Dr. Olevia PerchesMegan Johnson, DO.  This patient comes today after being seen in the past by Dr. Mechele CollinElliott. The patient no longer wants to see him and was unhappy with his care. The patient had been sent to him from urgent care. She reports that she has been having chronic right-sided abdominal pain. The abdominal pain is also associated with abdominal bloating. The patient states that the pain is constant and is sometimes a burning pain and other times a crampy pain. There is no report of any association with GI function. She denies any constipation or diarrhea. The patient also had not seen any change in her pain with eating or drinking. The pain is not improved with any bowel movements nor is there anything she can do to make the pain better. The patient also reports that she works out a lot.  There is no report of any nausea, vomiting, fevers, chills, black stools or bloody stools. The patient was given multiple medications by Dr. Mechele CollinElliott after having an EGD and colonoscopy with the findings of gastritis and diverticulosis. The patient states she was started on sublingual hyoscyamine and omeprazole. She was also started on Carafate. The patient states that none of these have helped any of her symptoms.  She reports that her symptoms are present in the morning. She states that she wakes up from a sleep and her abdomen is flat and then within a few minutes the abdomen is protruded with extensive bloating. She reports that she does not pass a lot of gas from her bottom.  The patient has had a CT scan, ultrasound and gallbladder emptying study which were all normal.  Past Medical History    Diagnosis Date  . Hypertension   . Anxiety   . Diverticulosis   . GERD (gastroesophageal reflux disease)     Past Surgical History  Procedure Laterality Date  . Partial hysterectomy  2004  . Femur fracture surgery Right 1983    [employer]  . Bilateral carpal tunnel release  1994    Prior to Admission medications   Medication Sig Start Date End Date Taking? Authorizing Provider  Blood Pressure Monitoring (BLOOD PRESSURE CUFF) MISC Take BP daily at different times and when feeling flushed 11/11/14  Yes Megan P Johnson, DO  hyoscyamine (ANASPAZ) 0.125 MG TBDP disintergrating tablet DISSOLVE 1-2 TS PO Q 4 H PRN 05/20/15  Yes Historical Provider, MD  lisinopril (PRINIVIL,ZESTRIL) 5 MG tablet Take 1 tablet (5 mg total) by mouth daily. 05/20/15  Yes Megan P Johnson, DO  omeprazole (PRILOSEC) 40 MG capsule TAKE 1 CAPSULE (40 MG TOTAL) BY MOUTH 2 (TWO) TIMES DAILY. 11/21/14  Yes Historical Provider, MD  sertraline (ZOLOFT) 100 MG tablet Take 0.5 tablets (50 mg total) by mouth daily. If not noticing all that much of a difference after 2 weeks- increase to 1.5 pills daily 05/20/15  Yes Megan P Johnson, DO  sucralfate (CARAFATE) 1 G tablet TK 1 T PO QID BEFORE MEALS AND NIGHTLY 05/06/15  Yes Historical Provider, MD  dicyclomine (BENTYL) 20 MG tablet Take 1 tablet (20 mg total) by mouth 3 (three)  times daily before meals. 08/11/15   Midge Minium, MD  sucralfate (CARAFATE) 1 G tablet Take by mouth. 11/27/14 12/27/14  Historical Provider, MD  traZODone (DESYREL) 50 MG tablet Take 0.5-1 tablets (25-50 mg total) by mouth at bedtime as needed for sleep. Patient not taking: Reported on 08/11/2015 06/10/15   Dorcas Carrow, DO    Family History  Problem Relation Age of Onset  . Diabetes Brother   . Diabetes Sister      Social History  Substance Use Topics  . Smoking status: Never Smoker   . Smokeless tobacco: Never Used  . Alcohol Use: 0.0 oz/week    0 Standard drinks or equivalent per week     Comment:  on the weekends    Allergies as of 08/11/2015  . (No Known Allergies)    Review of Systems:    All systems reviewed and negative except where noted in HPI.   Physical Exam:  BP 138/95 mmHg  Pulse 108  Temp(Src) 98.3 F (36.8 C) (Oral)  Ht  (1.549 m)  Wt 178 lb (80.74 kg)  BMI 33.65 kg/m2 No LMP recorded. Patient has had a hysterectomy. Psych:  Alert and cooperative. Normal mood and affect. General:   Alert,  Well-developed, well-nourished, pleasant and cooperative in NAD Head:  Normocephalic and atraumatic. Eyes:  Sclera clear, no icterus.   Conjunctiva pink. Ears:  Normal auditory acuity. Nose:  No deformity, discharge, or lesions. Mouth:  No deformity or lesions,oropharynx pink & moist. Neck:  Supple; no masses or thyromegaly. Lungs:  Respirations even and unlabored.  Clear throughout to auscultation.   No wheezes, crackles, or rhonchi. No acute distress. Heart:  Regular rate and rhythm; no murmurs, clicks, rubs, or gallops. Abdomen:  Normal bowel sounds.  No bruits.  Soft, Right middle quadrant tenderness and non-distended without masses, hepatosplenomegaly or hernias noted.  No guarding or rebound tenderness.  Negative Carnett sign.   Rectal:  Deferred.  Msk:  Symmetrical without gross deformities.  Good, equal movement & strength bilaterally. Pulses:  Normal pulses noted. Extremities:  No clubbing or edema.  No cyanosis. Neurologic:  Alert and oriented x3;  grossly normal neurologically. Skin:  Intact without significant lesions or rashes.  No jaundice. Lymph Nodes:  No significant cervical adenopathy. Psych:  Alert and cooperative. Normal mood and affect.  Imaging Studies: No results found.  Assessment and Plan:   Lindsey Drake is a 50 y.o. y/o female Who comes today with right sided mid abdominal discomfort. The patient's story is most consistent with muscle pain although the physical exam did not show reproducible abdominal wall tenderness. After having the  patient to multiple leg raises the pain was greatly exacerbated which was more consistent with muscle pain although not reproducible to palpation. She also has very little GI symptoms associated with the abdominal pain which is curious. The patient will be started on Advil 400 mg 3 times a day with food. She will also be started on a long-acting antispasmodic, dicyclomine 20 mg 3 times a day. She reports that she drinks milk on a regular basis and that may contribute to her bloating since she has been told to try and avoid milk for one week to see if she gets better. The patient has been explained the plan and has been told to contact me if she does not improve.  Note: This dictation was prepared with Dragon dictation along with smaller phrase technology. Any transcriptional errors that result from this process are unintentional.

## 2015-08-13 ENCOUNTER — Other Ambulatory Visit: Payer: Self-pay | Admitting: Family Medicine

## 2015-09-21 ENCOUNTER — Telehealth: Payer: Self-pay | Admitting: Family Medicine

## 2015-09-22 NOTE — Telephone Encounter (Signed)
Over due for follow up

## 2015-09-23 NOTE — Telephone Encounter (Signed)
Pt appt scheduled for 09/29/2015 but only has 3 pills left and would like to have enough sent to [facility] to last until appt

## 2015-09-23 NOTE — Telephone Encounter (Signed)
Rx was sent over yesterday

## 2015-09-30 ENCOUNTER — Ambulatory Visit (INDEPENDENT_AMBULATORY_CARE_PROVIDER_SITE_OTHER): Payer: BLUE CROSS/BLUE SHIELD | Admitting: Family Medicine

## 2015-09-30 ENCOUNTER — Encounter: Payer: Self-pay | Admitting: Family Medicine

## 2015-09-30 VITALS — BP 128/78 | HR 100 | Temp 98.4°F | Wt 181.0 lb

## 2015-09-30 DIAGNOSIS — F411 Generalized anxiety disorder: Secondary | ICD-10-CM

## 2015-09-30 DIAGNOSIS — I10 Essential (primary) hypertension: Secondary | ICD-10-CM | POA: Diagnosis not present

## 2015-09-30 DIAGNOSIS — Z1239 Encounter for other screening for malignant neoplasm of breast: Secondary | ICD-10-CM | POA: Diagnosis not present

## 2015-09-30 DIAGNOSIS — G4700 Insomnia, unspecified: Secondary | ICD-10-CM | POA: Diagnosis not present

## 2015-09-30 MED ORDER — SERTRALINE HCL 100 MG PO TABS: 150.0000 mg | ORAL_TABLET | Freq: Every day | ORAL | Status: DC

## 2015-09-30 MED ORDER — LISINOPRIL 5 MG PO TABS: ORAL_TABLET | ORAL | Status: DC

## 2015-09-30 MED ORDER — TRAZODONE HCL 50 MG PO TABS: 50.0000 mg | ORAL_TABLET | Freq: Every evening | ORAL | Status: DC | PRN

## 2015-09-30 NOTE — Assessment & Plan Note (Signed)
Not under good control. Will increase her zoloft to 150mg daily and recheck in 1 month.  

## 2015-09-30 NOTE — Assessment & Plan Note (Signed)
Rx for trazodone given. Increased dose. Recheck 1 month.

## 2015-09-30 NOTE — Assessment & Plan Note (Signed)
Under good control. Continue current regimen. Continue to monitor.  

## 2015-09-30 NOTE — Progress Notes (Signed)
BP 128/78 mmHg  Pulse 100  Temp(Src) 98.4 F (36.9 C)  Wt 181 lb (82.101 kg)  SpO2 98%   Subjective:    Patient ID: Lindsey Drake, female    DOB: [birth year], 50 y.o.   MRN: [id number]  HPI: Lindsey Drake is a 50 y.o. female  Chief Complaint  Patient presents with  . Anxiety  . Hypertension   ANXIETY/STRESS- still not sleeping. Found that the trazodone helped for about 2 weeks, but then stopped it. Now taking advil PM and z-quil, not sleeping at all Duration:worse Anxious mood: yes  Excessive worrying: yes Irritability: yes  Sweating: yes Nausea: no Palpitations:yes Hyperventilation: no Panic attacks: yes Agoraphobia: no  Obscessions/compulsions: yes Depressed mood: yes Depression screen [facility] 2/9 05/20/2015 12/09/2014  Decreased Interest 3 3  Down, Depressed, Hopeless 2 1  PHQ - 2 Score 5 4  Altered sleeping 3 3  Tired, decreased energy 3 3  Change in appetite 3 2  Feeling bad or failure about yourself  0 0  Trouble concentrating 0 0  Moving slowly or fidgety/restless 0 0  Suicidal thoughts 0 0  PHQ-9 Score 14 12  Difficult doing work/chores Very difficult Very difficult   Anhedonia: no Weight changes: no Insomnia: yes hard to fall asleep  Hypersomnia: no Fatigue/loss of energy: yes Feelings of worthlessness: no Feelings of guilt: yes Impaired concentration/indecisiveness: yes Suicidal ideations: no  Crying spells: no Recent Stressors/Life Changes: no  HYPERTENSION Hypertension status: controlled  Satisfied with current treatment? yes Duration of hypertension: chronic BP monitoring frequency:  not checking BP medication side effects:  no Medication compliance: excellent compliance Aspirin: no Recurrent headaches: no Visual changes: no Palpitations: no Dyspnea: no Chest pain: no Lower extremity edema: no Dizzy/lightheaded: no  Relevant past medical, surgical, family and social history reviewed and updated as indicated. Interim medical history  since our last visit reviewed. Allergies and medications reviewed and updated.  Review of Systems  Constitutional: Negative.   Respiratory: Negative.   Cardiovascular: Negative.   Psychiatric/Behavioral: Positive for sleep disturbance and decreased concentration. Negative for suicidal ideas, hallucinations, behavioral problems, confusion, self-injury, dysphoric mood and agitation. The patient is nervous/anxious. The patient is not hyperactive.     Per HPI unless specifically indicated above     Objective:    BP 128/78 mmHg  Pulse 100  Temp(Src) 98.4 F (36.9 C)  Wt 181 lb (82.101 kg)  SpO2 98%  Wt Readings from Last 3 Encounters:  09/30/15 181 lb (82.101 kg)  08/11/15 178 lb (80.74 kg)  06/10/15 177 lb (80.287 kg)    Physical Exam  Constitutional: She is oriented to person, place, and time. She appears well-developed and well-nourished. No distress.  HENT:  Head: Normocephalic and atraumatic.  Right Ear: Hearing normal.  Left Ear: Hearing normal.  Nose: Nose normal.  Eyes: Conjunctivae and lids are normal. Right eye exhibits no discharge. Left eye exhibits no discharge. No scleral icterus.  Cardiovascular: Normal rate, regular rhythm, normal heart sounds and intact distal pulses.  Exam reveals no gallop and no friction rub.   No murmur heard. Pulmonary/Chest: Effort normal and breath sounds normal. No respiratory distress. She has no wheezes. She has no rales. She exhibits no tenderness.  Musculoskeletal: Normal range of motion.  Neurological: She is alert and oriented to person, place, and time.  Skin: Skin is warm, dry and intact. No rash noted. No erythema. No pallor.  Psychiatric: She has a normal mood and affect. Her speech is normal and behavior  is normal. Judgment and thought content normal. Cognition and memory are normal.  Nursing note and vitals reviewed.       Assessment & Plan:   Problem List Items Addressed This Visit      Cardiovascular and Mediastinum    Essential hypertension, benign    Under good control. Continue current regimen. Continue to monitor.       Relevant Medications   lisinopril (PRINIVIL,ZESTRIL) 5 MG tablet     Other   Anxiety disorder - Primary    Not under good control. Will increase her zoloft to 150mg  daily and recheck in 1 month.       Insomnia    Rx for trazodone given. Increased dose. Recheck 1 month.        Other Visit Diagnoses    Screening for breast cancer        Mammogram ordered today. Await results.     Relevant Orders    MM DIGITAL SCREENING BILATERAL        Follow up plan: Return in about 4 weeks (around 10/28/2015) for Recheck sleep and anxiety.

## 2015-09-30 NOTE — Patient Instructions (Signed)
Stress and Stress Management Stress is a normal reaction to life events. It is what you feel when life demands more than you are used to or more than you can handle. Some stress can be useful. For example, the stress reaction can help you catch the last bus of the day, study for a test, or meet a deadline at work. But stress that occurs too often or for too long can cause problems. It can affect your emotional health and interfere with relationships and normal daily activities. Too much stress can weaken your immune system and increase your risk for physical illness. If you already have a medical problem, stress can make it worse. CAUSES  All sorts of life events may cause stress. An event that causes stress for one person may not be stressful for another person. Major life events commonly cause stress. These may be positive or negative. Examples include losing your job, moving into a new home, getting married, having a baby, or losing a loved one. Less obvious life events may also cause stress, especially if they occur day after day or in combination. Examples include working long hours, driving in traffic, caring for children, being in debt, or being in a difficult relationship. SIGNS AND SYMPTOMS Stress may cause emotional symptoms including, the following:  Anxiety. This is feeling worried, afraid, on edge, overwhelmed, or out of control.  Anger. This is feeling irritated or impatient.  Depression. This is feeling sad, down, helpless, or guilty.  Difficulty focusing, remembering, or making decisions. Stress may cause physical symptoms, including the following:   Aches and pains. These may affect your head, neck, back, stomach, or other areas of your body.  Tight muscles or clenched jaw.  Low energy or trouble sleeping. Stress may cause unhealthy behaviors, including the following:   Eating to feel better (overeating) or skipping meals.  Sleeping too little, too much, or both.  Working  too much or putting off tasks (procrastination).  Smoking, drinking alcohol, or using drugs to feel better. DIAGNOSIS  Stress is diagnosed through an assessment by your health care provider. Your health care provider will ask questions about your symptoms and any stressful life events.Your health care provider will also ask about your medical history and may order blood tests or other tests. Certain medical conditions and medicine can cause physical symptoms similar to stress. Mental illness can cause emotional symptoms and unhealthy behaviors similar to stress. Your health care provider may refer you to a mental health professional for further evaluation.  TREATMENT  Stress management is the recommended treatment for stress.The goals of stress management are reducing stressful life events and coping with stress in healthy ways.  Techniques for reducing stressful life events include the following:  Stress identification. Self-monitor for stress and identify what causes stress for you. These skills may help you to avoid some stressful events.  Time management. Set your priorities, keep a calendar of events, and learn to say "no." These tools can help you avoid making too many commitments. Techniques for coping with stress include the following:  Rethinking the problem. Try to think realistically about stressful events rather than ignoring them or overreacting. Try to find the positives in a stressful situation rather than focusing on the negatives.  Exercise. Physical exercise can release both physical and emotional tension. The key is to find a form of exercise you enjoy and do it regularly.  Relaxation techniques. These relax the body and mind. Examples include yoga, meditation, tai chi, biofeedback, deep  breathing, progressive muscle relaxation, listening to music, being out in nature, journaling, and other hobbies. Again, the key is to find one or more that you enjoy and can do  regularly.  Healthy lifestyle. Eat a balanced diet, get plenty of sleep, and do not smoke. Avoid using alcohol or drugs to relax.  Strong support network. Spend time with family, friends, or other people you enjoy being around.Express your feelings and talk things over with someone you trust. Counseling or talktherapy with a mental health professional may be helpful if you are having difficulty managing stress on your own. Medicine is typically not recommended for the treatment of stress.Talk to your health care provider if you think you need medicine for symptoms of stress. HOME CARE INSTRUCTIONS  Keep all follow-up visits as directed by your health care provider.  Take all medicines as directed by your health care provider. SEEK MEDICAL CARE IF:  Your symptoms get worse or you start having new symptoms.  You feel overwhelmed by your problems and can no longer manage them on your own. SEEK IMMEDIATE MEDICAL CARE IF:  You feel like hurting yourself or someone else.   This information is not intended to replace advice given to you by your health care provider. Make sure you discuss any questions you have with your health care provider.   Document Released: 11/03/2000 Document Revised: 05/31/2014 Document Reviewed: 01/02/2013 Elsevier Interactive Patient Education 2016 Elsevier Inc.  

## 2015-11-03 ENCOUNTER — Encounter: Payer: Self-pay | Admitting: Family Medicine

## 2015-11-03 ENCOUNTER — Ambulatory Visit (INDEPENDENT_AMBULATORY_CARE_PROVIDER_SITE_OTHER): Payer: BLUE CROSS/BLUE SHIELD | Admitting: Family Medicine

## 2015-11-03 VITALS — BP 118/81 | HR 100 | Temp 98.6°F | Wt 180.0 lb

## 2015-11-03 DIAGNOSIS — F411 Generalized anxiety disorder: Secondary | ICD-10-CM

## 2015-11-03 DIAGNOSIS — I10 Essential (primary) hypertension: Secondary | ICD-10-CM

## 2015-11-03 DIAGNOSIS — G4700 Insomnia, unspecified: Secondary | ICD-10-CM

## 2015-11-03 LAB — MICROALBUMIN, URINE WAIVED
Creatinine, Urine Waived: 200 mg/dL (ref 10–300)
Microalb, Ur Waived: 10 mg/L (ref 0–19)
Microalb/Creat Ratio: <30 mg/g (ref <30)

## 2015-11-03 MED ORDER — TRAZODONE HCL 50 MG PO TABS: 100.0000 mg | ORAL_TABLET | Freq: Every evening | ORAL | Status: DC | PRN

## 2015-11-03 MED ORDER — SERTRALINE HCL 100 MG PO TABS: 150.0000 mg | ORAL_TABLET | Freq: Every day | ORAL | Status: DC

## 2015-11-03 NOTE — Assessment & Plan Note (Signed)
Will increase her trazodone to 100mg  qHS. Will call with any problems. Continue to monitor.

## 2015-11-03 NOTE — Assessment & Plan Note (Signed)
Under good control. Continue current regimen. Continue to monitor. BMP rechecked today. Recheck 6 months.

## 2015-11-03 NOTE — Assessment & Plan Note (Signed)
Does not want to change regimen at this time. Will continue current regimen and continue to monitor. Call if getting worse. Recheck 6 months.

## 2015-11-03 NOTE — Progress Notes (Signed)
BP 118/81 mmHg  Pulse 100  Temp(Src) 98.6 F (37 C)  Wt 180 lb (81.647 kg)  SpO2 97%   Subjective:    Patient ID: Lindsey Drake, female    DOB: 07/21/1965, 50 y.o.   MRN: [id number]  HPI: Lindsey Drake is a 50 y.o. female  Chief Complaint  Patient presents with  . Anxiety  . Hypertension   HYPERTENSION Hypertension status: controlled  Satisfied with current treatment? yes Duration of hypertension: chronic BP monitoring frequency:  not checking BP medication side effects:  no Medication compliance: excellent compliance Previous BP meds: lisinopril Aspirin: no Recurrent headaches: no Visual changes: no Palpitations: no Dyspnea: no Chest pain: no Lower extremity edema: no Dizzy/lightheaded: no  ANXIETY/STRESS- trazodone not helping her sleep Duration:better- but still not there. Still feeling really stressed Anxious mood: yes  Excessive worrying: yes Irritability: yes  Sweating: yes Nausea: yes Palpitations:yes Hyperventilation: yes Panic attacks: no Agoraphobia: no  Obscessions/compulsions: no Depressed mood: yes Depression screen [facility] 2/9 05/20/2015 12/09/2014  Decreased Interest 3 3  Down, Depressed, Hopeless 2 1  PHQ - 2 Score 5 4  Altered sleeping 3 3  Tired, decreased energy 3 3  Change in appetite 3 2  Feeling bad or failure about yourself  0 0  Trouble concentrating 0 0  Moving slowly or fidgety/restless 0 0  Suicidal thoughts 0 0  PHQ-9 Score 14 12  Difficult doing work/chores Very difficult Very difficult   GAD 7 : Generalized Anxiety Score 11/03/2015 09/30/2015 06/10/2015 06/10/2015  Nervous, Anxious, on Edge 3 3 1 1   Control/stop worrying 3 3 2 2   Worry too much - different things 3 3 2 2   Trouble relaxing 2 1 2 2   Restless 0 0 0 0  Easily annoyed or irritable 3 3 2 2   Afraid - awful might happen 0 0 0 0  Total GAD 7 Score 14 13 9 9   Anxiety Difficulty Very difficult Somewhat difficult Somewhat difficult Somewhat difficult   Anhedonia:  no Weight changes: no Insomnia: yes hard to fall asleep  Hypersomnia: no Fatigue/loss of energy: yes Feelings of worthlessness: yes Feelings of guilt: yes Impaired concentration/indecisiveness: yes Suicidal ideations: no  Crying spells: yes Recent Stressors/Life Changes: yes   Relationship problems: no   Family stress: yes     Financial stress: yes    Job stress: no    Recent death/loss: no   Relevant past medical, surgical, family and social history reviewed and updated as indicated. Interim medical history since our last visit reviewed. Allergies and medications reviewed and updated.  Review of Systems  Constitutional: Negative.   Respiratory: Negative.   Cardiovascular: Negative.   Psychiatric/Behavioral: Positive for sleep disturbance. Negative for suicidal ideas, hallucinations, behavioral problems, confusion, self-injury, dysphoric mood, decreased concentration and agitation. The patient is nervous/anxious. The patient is not hyperactive.     Per HPI unless specifically indicated above     Objective:    BP 118/81 mmHg  Pulse 100  Temp(Src) 98.6 F (37 C)  Wt 180 lb (81.647 kg)  SpO2 97%  Wt Readings from Last 3 Encounters:  11/03/15 180 lb (81.647 kg)  09/30/15 181 lb (82.101 kg)  08/11/15 178 lb (80.74 kg)    Physical Exam  Constitutional: She is oriented to person, place, and time. She appears well-developed and well-nourished. No distress.  HENT:  Head: Normocephalic and atraumatic.  Right Ear: Hearing normal.  Left Ear: Hearing normal.  Nose: Nose normal.  Eyes: Conjunctivae and lids are  normal. Right eye exhibits no discharge. Left eye exhibits no discharge. No scleral icterus.  Cardiovascular: Normal rate, regular rhythm, normal heart sounds and intact distal pulses.  Exam reveals no gallop and no friction rub.   No murmur heard. Pulmonary/Chest: Effort normal and breath sounds normal. No respiratory distress. She has no wheezes. She has no rales. She  exhibits no tenderness.  Musculoskeletal: Normal range of motion.  Neurological: She is alert and oriented to person, place, and time.  Skin: Skin is warm, dry and intact. No rash noted. No erythema. No pallor.  Psychiatric: She has a normal mood and affect. Her speech is normal and behavior is normal. Judgment and thought content normal. Cognition and memory are normal.  Nursing note and vitals reviewed.       Assessment & Plan:   Problem List Items Addressed This Visit      Cardiovascular and Mediastinum   Essential hypertension, benign - Primary    Under good control. Continue current regimen. Continue to monitor. BMP rechecked today. Recheck 6 months.       Relevant Orders   Basic metabolic panel     Other   Anxiety disorder    Does not want to change regimen at this time. Will continue current regimen and continue to monitor. Call if getting worse. Recheck 6 months.       Relevant Medications   sertraline (ZOLOFT) 100 MG tablet   Insomnia    Will increase her trazodone to  qHS. Will call with any problems. Continue to monitor.       Relevant Medications   traZODone (DESYREL) 50 MG tablet       Follow up plan: Return in about 6 months (around 05/04/2016) for Physical.

## 2015-11-04 ENCOUNTER — Encounter: Payer: Self-pay | Admitting: Family Medicine

## 2015-11-04 LAB — BASIC METABOLIC PANEL
BUN/Creatinine Ratio: 14 (ref 9–23)
BUN: 9 mg/dL (ref 6–24)
CO2: 21 mmol/L (ref 18–29)
Calcium: 9.2 mg/dL (ref 8.7–10.2)
Chloride: 100 mmol/L (ref 96–106)
Creatinine, Ser: 0.66 mg/dL (ref 0.57–1.00)
GFR calc Af Amer: 119 mL/min/{1.73_m2} (ref >59)
GFR calc non Af Amer: 103 mL/min/{1.73_m2} (ref >59)
Glucose: 87 mg/dL (ref 65–99)
Potassium: 3.9 mmol/L (ref 3.5–5.2)
Sodium: 140 mmol/L (ref 134–144)

## 2016-01-12 ENCOUNTER — Ambulatory Visit (INDEPENDENT_AMBULATORY_CARE_PROVIDER_SITE_OTHER): Payer: BLUE CROSS/BLUE SHIELD | Admitting: Family Medicine

## 2016-01-12 ENCOUNTER — Encounter: Payer: Self-pay | Admitting: Family Medicine

## 2016-01-12 ENCOUNTER — Telehealth: Payer: Self-pay | Admitting: Family Medicine

## 2016-01-12 VITALS — BP 132/88 | HR 76 | Temp 99.3°F | Wt 186.0 lb

## 2016-01-12 DIAGNOSIS — R140 Abdominal distension (gaseous): Secondary | ICD-10-CM

## 2016-01-12 DIAGNOSIS — S39011A Strain of muscle, fascia and tendon of abdomen, initial encounter: Secondary | ICD-10-CM

## 2016-01-12 DIAGNOSIS — Z1239 Encounter for other screening for malignant neoplasm of breast: Secondary | ICD-10-CM

## 2016-01-12 NOTE — Telephone Encounter (Signed)
Can you please let her know I've got a Rx for PT up front for her to go somewhere else.

## 2016-01-12 NOTE — Progress Notes (Signed)
BP 132/88 (BP Location: Left Arm, Patient Position: Sitting, Cuff Size: Large)   Pulse 76   Temp 99.3 F (37.4 C)   Wt 186 lb (84.4 kg)   SpO2 97%   BMI 35.14 kg/m    Subjective:    Patient ID: Lindsey Drake, female    DOB: 11/03/1965, 50 y.o.   MRN: [id number]  HPI: Lindsey Drake is a 50 y.o. female  Chief Complaint  Patient presents with  . Referral    GI, Abdominal bloating   ABDOMINAL ISSUES Duration: 2 years Nature: bloating, crampy, knotty, shooting pain Location: diffuse and vague  Severity: moderate  Radiation: no Episode duration: constant Frequency: constant Alleviating factors: laying flat Aggravating factors: nothing Treatments attempted: ibuprofen, tylenol,, antacids, PPI, H2 Blocker and laxatives Constipation: no Diarrhea: no Episodes of diarrhea/day: Mucous in the stool: no Heartburn: no Bloating:yes Flatulence: no Nausea: no Vomiting: no Melena or hematochezia: no Rash: no Jaundice: no Fever: no Weight loss: no  Relevant past medical, surgical, family and social history reviewed and updated as indicated. Interim medical history since our last visit reviewed. Allergies and medications reviewed and updated.  Review of Systems  Constitutional: Negative.   Respiratory: Negative.   Cardiovascular: Negative.   Gastrointestinal: Positive for abdominal distention and abdominal pain. Negative for anal bleeding, blood in stool, constipation, diarrhea, nausea, rectal pain and vomiting.  Musculoskeletal: Positive for myalgias. Negative for arthralgias, back pain, gait problem, joint swelling, neck pain and neck stiffness.  Psychiatric/Behavioral: Negative.     Per HPI unless specifically indicated above     Objective:    BP 132/88 (BP Location: Left Arm, Patient Position: Sitting, Cuff Size: Large)   Pulse 76   Temp 99.3 F (37.4 C)   Wt 186 lb (84.4 kg)   SpO2 97%   BMI 35.14 kg/m   Wt Readings from Last 3 Encounters:  01/12/16 186 lb  (84.4 kg)  11/03/15 180 lb (81.6 kg)  09/30/15 181 lb (82.1 kg)    Physical Exam  Constitutional: She is oriented to person, place, and time. She appears well-developed and well-nourished. No distress.  HENT:  Head: Normocephalic and atraumatic.  Right Ear: Hearing normal.  Left Ear: Hearing normal.  Nose: Nose normal.  Eyes: Conjunctivae and lids are normal. Right eye exhibits no discharge. Left eye exhibits no discharge. No scleral icterus.  Cardiovascular: Normal rate, regular rhythm, normal heart sounds and intact distal pulses.  Exam reveals no gallop and no friction rub.   No murmur heard. Pulmonary/Chest: Effort normal and breath sounds normal. No respiratory distress. She has no wheezes. She has no rales. She exhibits no tenderness.  Abdominal: Soft. Bowel sounds are normal. She exhibits no distension and no mass. There is tenderness in the right upper quadrant. There is no rigidity, no rebound, no guarding, no CVA tenderness, no tenderness at McBurney's point and negative Murphy's sign.  Musculoskeletal: Normal range of motion.  Neurological: She is alert and oriented to person, place, and time.  Skin: Skin is warm, dry and intact. No rash noted. No erythema. No pallor.  Psychiatric: She has a normal mood and affect. Her speech is normal and behavior is normal. Judgment and thought content normal. Cognition and memory are normal.  Nursing note and vitals reviewed.   Results for orders placed or performed in visit on 11/03/15  Basic metabolic panel  Result Value Ref Range   Glucose 87 65 - 99 mg/dL   BUN 9 6 - 24 mg/dL  Creatinine, Ser 0.66 0.57 - 1.00 mg/dL   GFR calc non Af Amer 103 >59 mL/min/1.73   GFR calc Af Amer 119 >59 mL/min/1.73   BUN/Creatinine Ratio 14 9 - 23   Sodium 140 134 - 144 mmol/L   Potassium 3.9 3.5 - 5.2 mmol/L   Chloride 100 96 - 106 mmol/L   CO2 21 18 - 29 mmol/L   Calcium 9.2 8.7 - 10.2 mg/dL  Microalbumin, Urine Waived  Result Value Ref Range     Microalb, Ur Waived 10 0 - 19 mg/L   Creatinine, Urine Waived 200 10 - 300 mg/dL   Microalb/Creat Ratio <30 <30 mg/g      Assessment & Plan:   Problem List Items Addressed This Visit    None    Visit Diagnoses    Abdominal muscle strain, initial encounter    -  Primary   Will get her into PT- referral generated today   Relevant Orders   Ambulatory referral to Physical Therapy   Bloating       Separate from pain. Seems to be different cause. Will get her back into GI for further evaluation.    Screening for breast cancer       Mammogram ordered today.   Relevant Orders   MM DIGITAL SCREENING BILATERAL       Follow up plan: Return 3-4 weeks, for follow up belly pain.

## 2016-02-02 ENCOUNTER — Ambulatory Visit: Payer: BLUE CROSS/BLUE SHIELD

## 2016-02-02 ENCOUNTER — Ambulatory Visit (INDEPENDENT_AMBULATORY_CARE_PROVIDER_SITE_OTHER): Payer: BLUE CROSS/BLUE SHIELD | Admitting: Family Medicine

## 2016-02-02 ENCOUNTER — Encounter: Payer: Self-pay | Admitting: Family Medicine

## 2016-02-02 VITALS — BP 133/87 | HR 91 | Temp 98.7°F | Ht 62.7 in | Wt 186.8 lb

## 2016-02-02 DIAGNOSIS — Z23 Encounter for immunization: Secondary | ICD-10-CM | POA: Diagnosis not present

## 2016-02-02 NOTE — Progress Notes (Signed)
Patient did not see PT. No charge visit. Will get her back in following seeing PT.

## 2016-02-02 NOTE — Patient Instructions (Addendum)

## 2016-03-25 ENCOUNTER — Telehealth: Payer: Self-pay | Admitting: Family Medicine

## 2016-03-25 DIAGNOSIS — S39011D Strain of muscle, fascia and tendon of abdomen, subsequent encounter: Secondary | ICD-10-CM

## 2016-03-25 NOTE — Telephone Encounter (Signed)
Pt called wondering if Dr.Johnson could give her a referral to an orthopedic she doesn't feel like the physical therapy is helping. Message was sent to CMA.

## 2016-03-26 NOTE — Telephone Encounter (Signed)
Referral to ortho placed.

## 2016-03-26 NOTE — Telephone Encounter (Signed)
Routing to provider  

## 2016-04-06 ENCOUNTER — Telehealth: Payer: Self-pay | Admitting: Family Medicine

## 2016-04-06 DIAGNOSIS — R109 Unspecified abdominal pain: Secondary | ICD-10-CM

## 2016-04-06 NOTE — Telephone Encounter (Signed)
[facility] called stated pt wanted a referral for abdominal pain. Please fax referral and information to Fax # [phone]. Thanks.

## 2016-04-07 NOTE — Telephone Encounter (Signed)
All set!

## 2016-04-07 NOTE — Telephone Encounter (Signed)
Can you find out why she wants to see orthopedics for abdominal pain? She has abdominal muscle issues and we sent her to PT, but I'm not sure ortho is the right place for her.

## 2016-04-07 NOTE — Telephone Encounter (Signed)
I asked patient yesterday, she said that she has spoken with Ortho and that they can help her, I was very confused also.

## 2016-04-13 ENCOUNTER — Encounter: Payer: Self-pay | Admitting: *Deleted

## 2016-04-28 ENCOUNTER — Ambulatory Visit: Payer: Self-pay | Admitting: General Surgery

## 2016-05-04 ENCOUNTER — Encounter: Payer: Self-pay | Admitting: General Surgery

## 2016-05-04 ENCOUNTER — Encounter: Payer: BLUE CROSS/BLUE SHIELD | Admitting: Family Medicine

## 2016-05-04 ENCOUNTER — Ambulatory Visit (INDEPENDENT_AMBULATORY_CARE_PROVIDER_SITE_OTHER): Payer: BLUE CROSS/BLUE SHIELD | Admitting: General Surgery

## 2016-05-04 VITALS — BP 138/80 | HR 98 | Resp 14 | Ht 62.0 in | Wt 192.0 lb

## 2016-05-04 DIAGNOSIS — R109 Unspecified abdominal pain: Secondary | ICD-10-CM | POA: Insufficient documentation

## 2016-05-04 NOTE — Patient Instructions (Addendum)
The patient is aware to call back for any questions or concerns.  Try lidocaine pain patches and send a  message regarding affects
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
## 2016-05-07 ENCOUNTER — Other Ambulatory Visit: Payer: Self-pay

## 2016-05-07 ENCOUNTER — Telehealth: Payer: Self-pay

## 2016-05-07 DIAGNOSIS — M5414 Radiculopathy, thoracic region: Secondary | ICD-10-CM

## 2016-05-07 NOTE — Telephone Encounter (Signed)
Message left for the patient to call the office regarding the metal rod in her leg.

## 2016-05-07 NOTE — Telephone Encounter (Signed)
-----   Message from Jeffrey W Byrnett, MD sent at 05/07/2016  7:13 AM EST ----- Please schedule a thoracic MRI re: right T8 radicular pain. (Without contrast). Thanks.  

## 2016-05-11 NOTE — Telephone Encounter (Signed)
Please call patient back on wednesday

## 2016-05-12 NOTE — Telephone Encounter (Signed)
-----   Message from Earline MayotteJeffrey W Byrnett, MD sent at 05/07/2016  7:13 AM EST ----- Please schedule a thoracic MRI re: right T8 radicular pain. (Without contrast). Thanks.

## 2016-05-12 NOTE — Telephone Encounter (Signed)
Spoke with Lindsey BergamoYohanna at [facility] and she said that their would be no problems with the patient having a metal rod in her leg. I spoke with the patient and she is amendable to having the MRI done. The patient is scheduled for a MRI of the Thoracic spine at [facility] on 05/31/16 at 3:00 pm. She will arrive by 2:30 pm. The patient is aware of date, time, and instructions.

## 2016-05-25 IMAGING — CT CT ABD-PELV W/ CM
2 of 5 series · 16 of 46 positions shown, 18 images · IV contrast (omnipaque)
Comparison: None

CLINICAL DATA: Intermittent abdominal swelling and RIGHT-side
abdominal pain beginning 6 months ago, early satiety, loss of
appetite, nausea, history hypertension, partial hysterectomy

EXAM:
CT ABDOMEN AND PELVIS WITH CONTRAST
TECHNIQUE: Multidetector CT imaging of the abdomen and pelvis was performed
using the standard protocol following bolus administration of
intravenous contrast. Sagittal and coronal MPR images reconstructed
from axial data set.
CONTRAST:  100mL OMNIPAQUE IOHEXOL 300 MG/ML SOLN IV. Dilute oral
contrast.

[Series 2: routine with · axial · 0.72mm/px · z∈[-929,-509]mm · 13 of 96 slices shown, 15 images]
[im 6/96  soft-tissue]
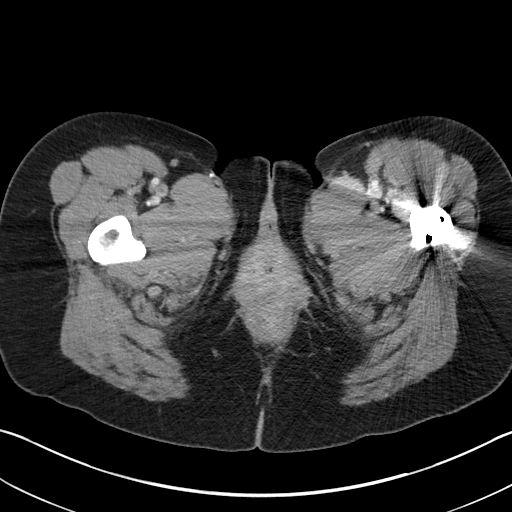
[im 6/96  bone]
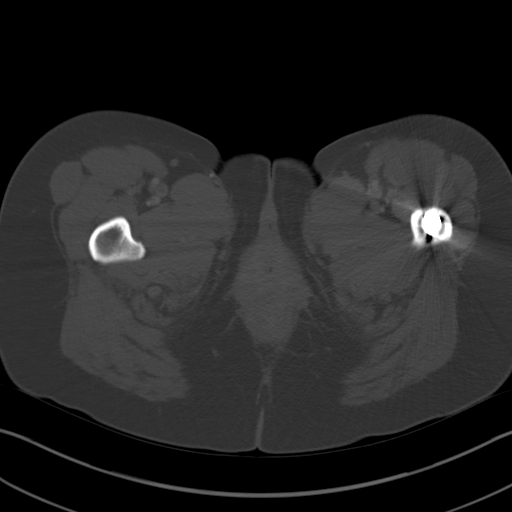
[im 11/96  soft-tissue]
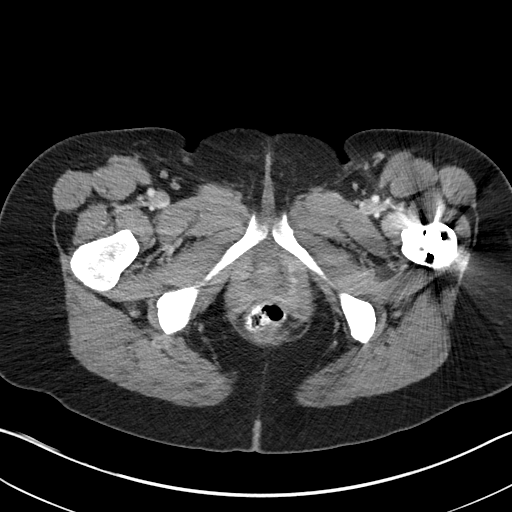
[im 22/96  soft-tissue]
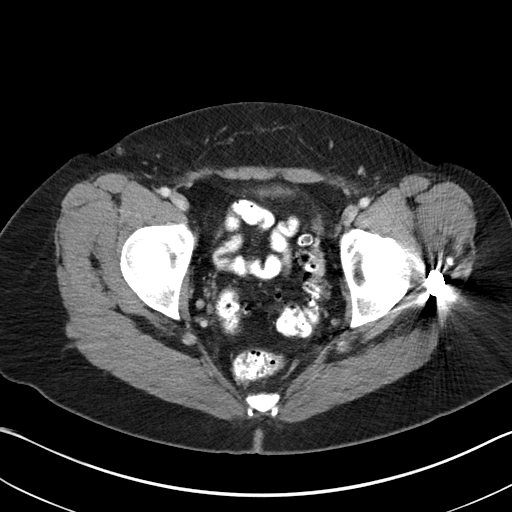
[im 27/96  soft-tissue]
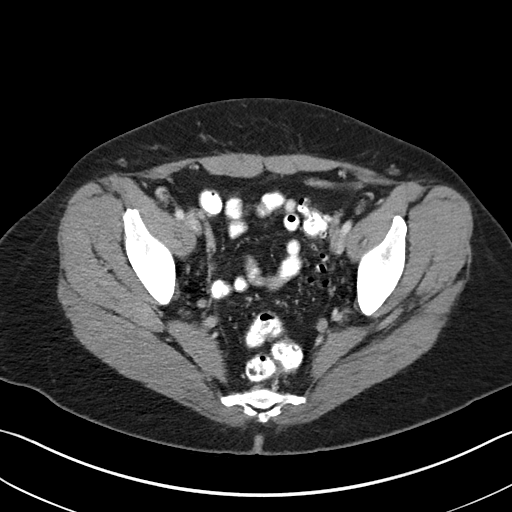
[im 32/96  soft-tissue]
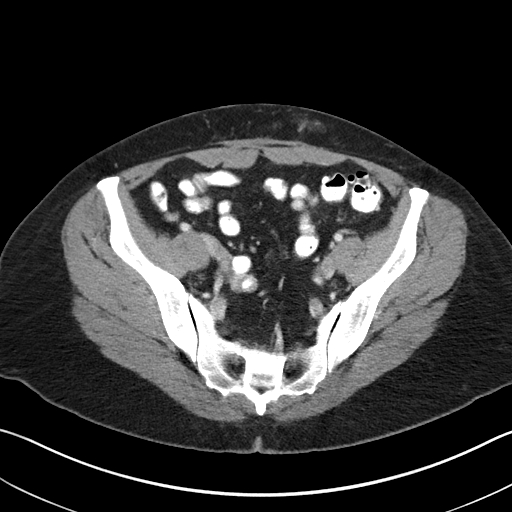
[im 43/96  soft-tissue]
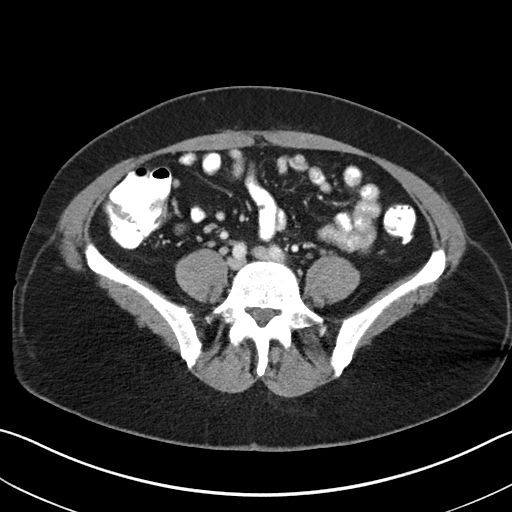
[im 48/96  soft-tissue]
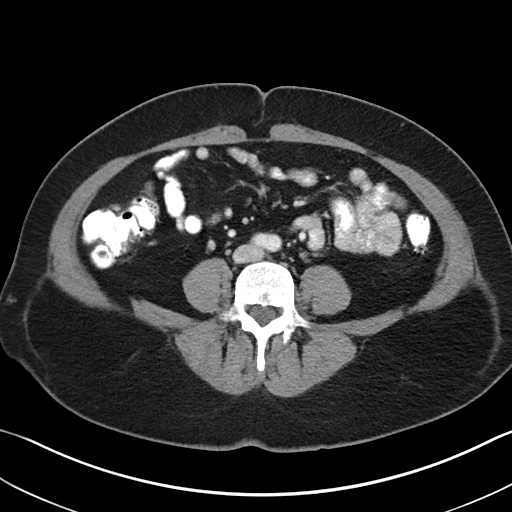
[im 53/96  soft-tissue]
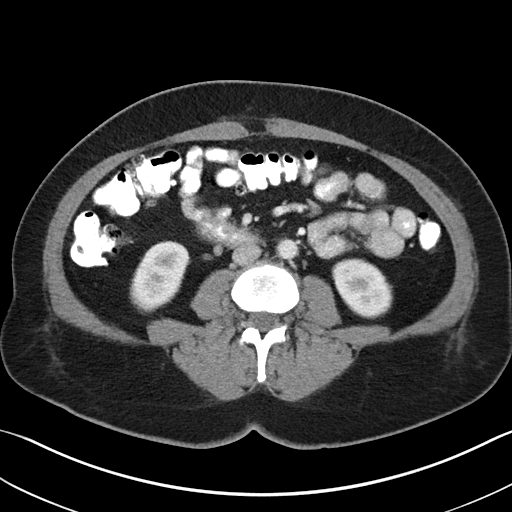
[im 64/96  soft-tissue]
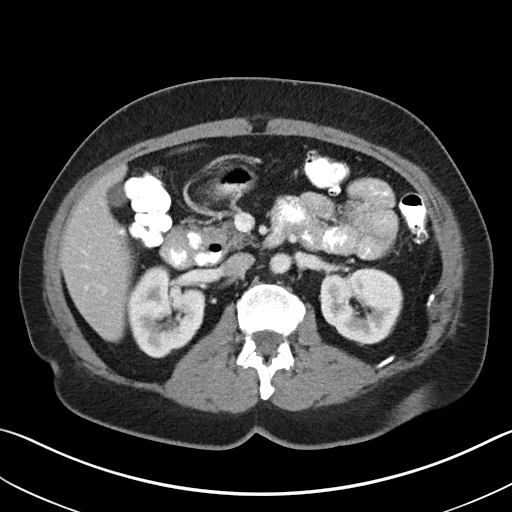
[im 64/96  bone]
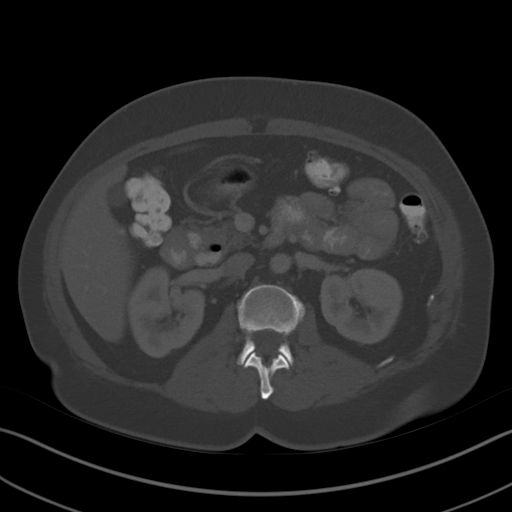
[im 69/96  soft-tissue]
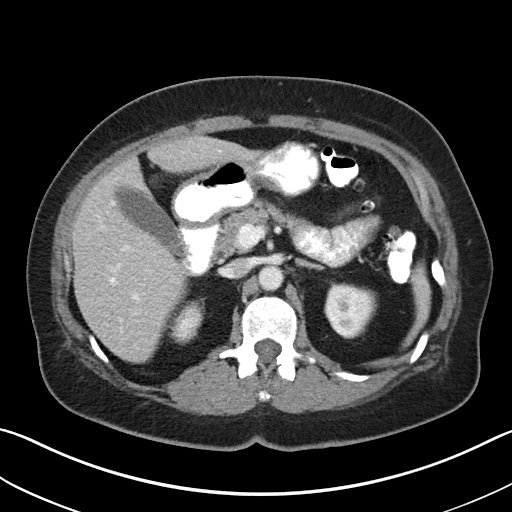
[im 74/96  soft-tissue]
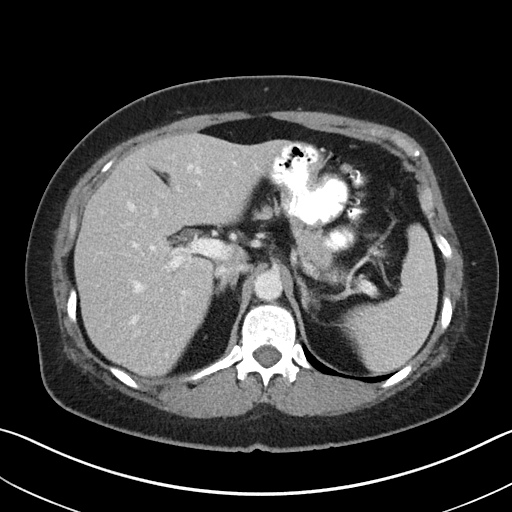
[im 85/96  soft-tissue]
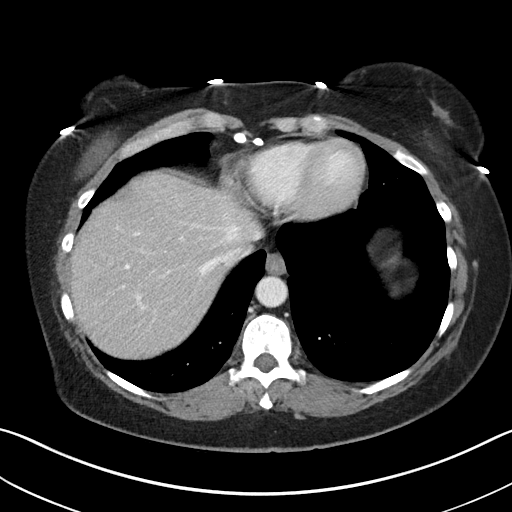
[im 90/96  soft-tissue]
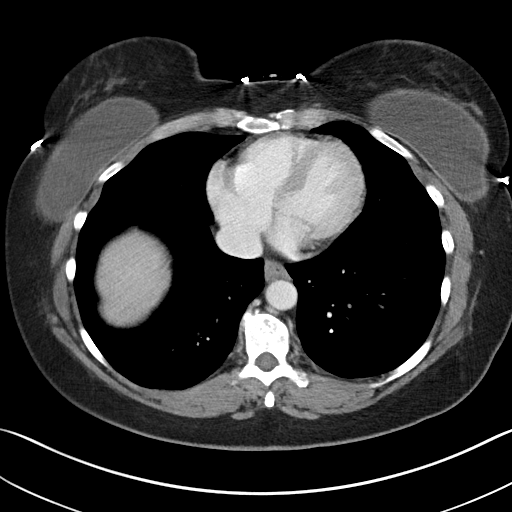

[Series 6: cor routine with · coronal · 0.74mm/px · 3 of 144 slices shown]
[im 48/144  soft-tissue]
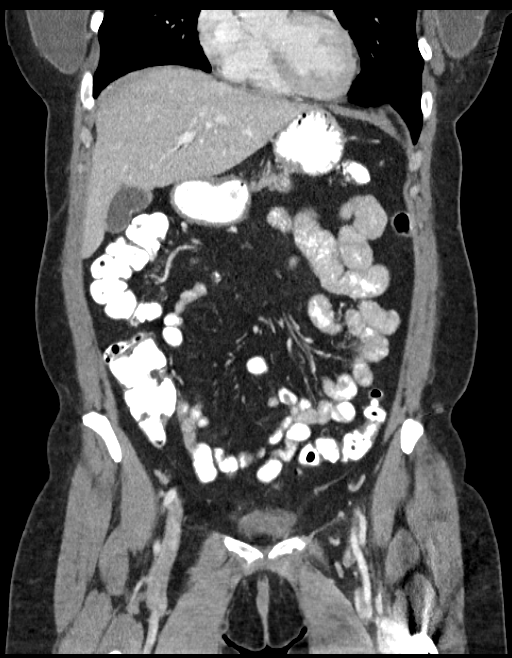
[im 64/144  soft-tissue]
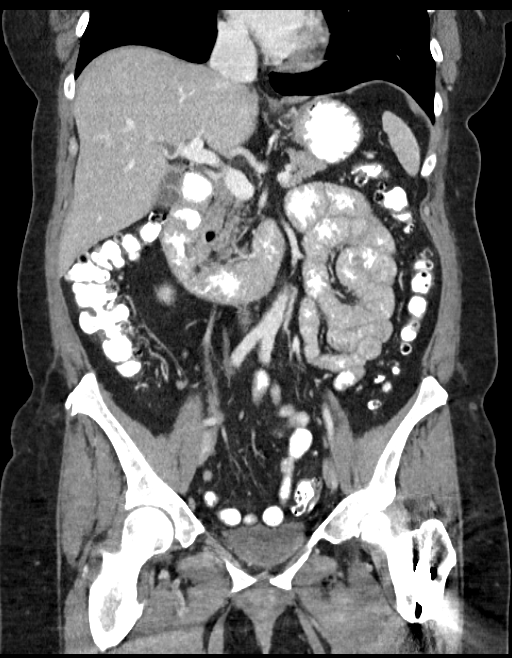
[im 80/144  soft-tissue]
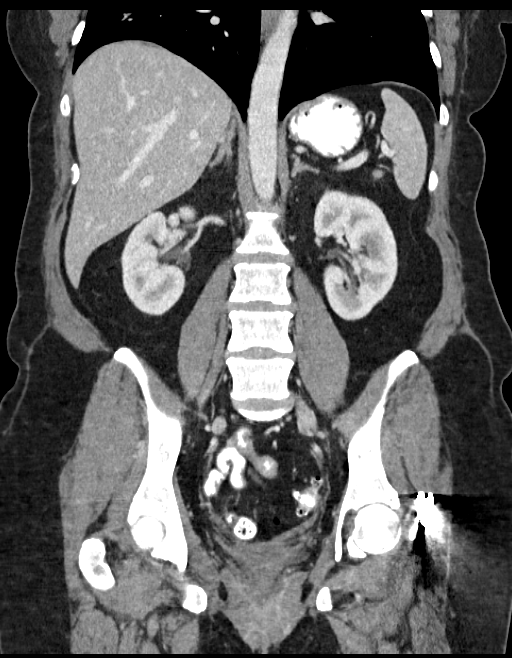

[16 of 46 positions shown; findings below may reference images not displayed]

FINDINGS: Minimal dependent atelectasis RIGHT lower lobe.

BILATERAL breast prostheses.

6 mm low-attenuation focus RIGHT lobe liver image 29 question tiny
cyst.

Liver, gallbladder, spleen, pancreas, kidneys, and adrenal glands
otherwise normal.

Duodenal diverticulum at second portion.

Diffuse colonic diverticulosis.

Stomach and bowel loops otherwise normal appearance.

Post hysterectomy with normal appearing ovaries and appendix.

Band of subcutaneous infiltration and calcification across the mid
abdomen question related to prior surgery.

Unremarkable bladder and ureters.

No mass, adenopathy, free air, free fluid, hernia, or inflammatory
process.

Orthopedic hardware proximal LEFT femur with osseous structures
otherwise unremarkable.
IMPRESSION: Diffuse colonic diverticulosis without evidence of diverticulitis.

No definite acute intra-abdominal or intrapelvic abnormalities.

## 2016-05-31 ENCOUNTER — Ambulatory Visit
Admission: RE | Admit: 2016-05-31 | Discharge: 2016-05-31 | Disposition: A | Discharge status: Home / Self Care | Payer: BLUE CROSS/BLUE SHIELD | Source: Ambulatory Visit | Attending: General Surgery | Admitting: General Surgery

## 2016-05-31 DIAGNOSIS — M47896 Other spondylosis, lumbar region: Secondary | ICD-10-CM | POA: Insufficient documentation

## 2016-05-31 DIAGNOSIS — M5414 Radiculopathy, thoracic region: Secondary | ICD-10-CM | POA: Diagnosis not present

## 2016-06-01 ENCOUNTER — Telehealth: Payer: Self-pay | Admitting: *Deleted

## 2016-06-01 NOTE — Telephone Encounter (Signed)
-----   Message from Earline MayotteJeffrey W Byrnett, MD sent at 06/01/2016 11:11 AM EST ----- Please notify the patient at the MRI was normal. No source for her abdominal pain. I have no new ideas for options except to consider referral to pain management.  ----- Message ----- From: Interface, Rad Results In Sent: 05/31/2016   4:41 PM To: Earline MayotteJeffrey W Byrnett, MD

## 2016-06-02 NOTE — Telephone Encounter (Signed)
Notified patient as instructed, patient pleased. She states that she would like a referral to pain management as she does not have any other options.

## 2016-06-02 NOTE — Telephone Encounter (Signed)
Please refer to pain clinic for assessment.  Notify the patient it may be 5-6 weeks to get an appointment.

## 2016-06-03 ENCOUNTER — Other Ambulatory Visit: Payer: Self-pay

## 2016-06-03 DIAGNOSIS — R109 Unspecified abdominal pain: Secondary | ICD-10-CM

## 2016-06-03 DIAGNOSIS — M5414 Radiculopathy, thoracic region: Secondary | ICD-10-CM

## 2016-06-07 NOTE — Telephone Encounter (Signed)
Patient notified referral to pain management at [facility] has been done. She is aware it may take up to two months to get in. Contact information given to the patient.

## 2016-06-08 IMAGING — NM NM HEPATO W/GB/PHARM/[PERSON_NAME]
2 series · 12 of 12 positions shown · non-contrast
Comparison: CT abdomen pelvis of 01/29/2015 and ultrasound of the
right upper quadrant of 08/08/2014

CLINICAL DATA: Right upper quadrant abdominal pain for several
months with some nausea

EXAM:
NUCLEAR MEDICINE HEPATOBILIARY IMAGING WITH GALLBLADDER EF
TECHNIQUE: Sequential images of the abdomen were obtained [DATE] minutes
following intravenous administration of radiopharmaceutical. After
slow intravenous infusion of 1.6 micrograms Cholecystokinin,
gallbladder ejection fraction was determined.
RADIOPHARMACEUTICALS:  5.25 mCi Wc-VVm Choletec IV

[Series 1000: gallbladder ef · 4.80mm/px · 6 of 120 frames shown]
[frame 11/120]
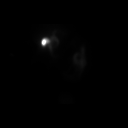
[frame 31/120]
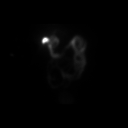
[frame 51/120]
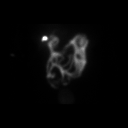
[frame 71/120]
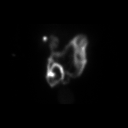
[frame 91/120]
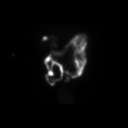
[frame 111/120]
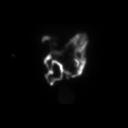

[Series 1000: hepatobiliary scan · 9.59mm/px · 6 of 60 frames shown]
[frame 6/60]
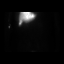
[frame 16/60]
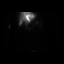
[frame 26/60]
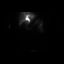
[frame 36/60]
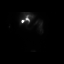
[frame 46/60]
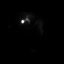
[frame 56/60]
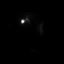

[12 of 12 positions shown; findings below may reference images not displayed]

FINDINGS: The patient was injected with 5.25 mCi of technetium 99 M Choletec
intravenously and imaging over the upper abdomen was performed. The
radionuclide appears throughout the liver and there is excretion
into the intrahepatic ductal system with visualization of the common
bile duct, gallbladder, and small bowel. This represents a normal
nuclear medicine hepatobiliary scan.

The patient was then given 1.6 mcg of CCK and imaging over the
gallbladder was performed. The gallbladder ejection fraction is
measured at 96% at 41 minutes which is within normal limits.. At 45
min, normal ejection fraction is greater than 40%.
IMPRESSION: 1. Normal nuclear medicine hepatobiliary scan.
2. Normal gallbladder ejection fraction.

## 2016-06-13 ENCOUNTER — Other Ambulatory Visit: Payer: Self-pay | Admitting: Family Medicine

## 2016-09-16 ENCOUNTER — Other Ambulatory Visit: Payer: Self-pay | Admitting: Family Medicine

## 2016-09-28 ENCOUNTER — Encounter: Payer: Self-pay | Admitting: Family Medicine

## 2016-09-28 ENCOUNTER — Ambulatory Visit (INDEPENDENT_AMBULATORY_CARE_PROVIDER_SITE_OTHER): Payer: BLUE CROSS/BLUE SHIELD | Admitting: Family Medicine

## 2016-09-28 VITALS — BP 143/102 | HR 105 | Wt 192.8 lb

## 2016-09-28 DIAGNOSIS — N61 Mastitis without abscess: Secondary | ICD-10-CM

## 2016-09-28 MED ORDER — SULFAMETHOXAZOLE-TRIMETHOPRIM 800-160 MG PO TABS: 1.0000 | ORAL_TABLET | Freq: Two times a day (BID) | ORAL | 0 refills | Status: DC

## 2016-09-28 NOTE — Progress Notes (Signed)
BP (!) 143/102 (BP Location: Left Arm, Patient Position: Sitting, Cuff Size: Normal)   Pulse (!) 105   Wt 192 lb 12.8 oz (87.5 kg)   SpO2 97%   BMI 35.26 kg/m    Subjective:    Patient ID: Lindsey Drake, female    DOB: [birth year], 51 y.o.   MRN: [id number]  HPI: Lindsey Drake is a 51 y.o. female  Chief Complaint  Patient presents with  . Abscess   BREAST PAIN Duration :Saturday Location: L underside of her breast Onset: sudden Severity: severe Quality: burning and aching Frequency: constant Redness: yes Swelling: yes Trauma: no trauma Breastfeeding: no Associated with menstral cycle: no Nipple discharge: no Breast lump: no Status: worse Treatments attempted: none Previous mammogram: yes  Relevant past medical, surgical, family and social history reviewed and updated as indicated. Interim medical history since our last visit reviewed. Allergies and medications reviewed and updated.  Review of Systems  Constitutional: Negative.   Respiratory: Negative.   Cardiovascular: Negative.   Musculoskeletal: Negative.   Skin: Positive for color change. Negative for pallor, rash and wound.  Psychiatric/Behavioral: Negative.     Per HPI unless specifically indicated above     Objective:    BP (!) 143/102 (BP Location: Left Arm, Patient Position: Sitting, Cuff Size: Normal)   Pulse (!) 105   Wt 192 lb 12.8 oz (87.5 kg)   SpO2 97%   BMI 35.26 kg/m   Wt Readings from Last 3 Encounters:  09/28/16 192 lb 12.8 oz (87.5 kg)  05/04/16 192 lb (87.1 kg)  02/02/16 186 lb 12.8 oz (84.7 kg)    Physical Exam  Constitutional: She is oriented to person, place, and time. She appears well-developed and well-nourished. No distress.  HENT:  Head: Normocephalic and atraumatic.  Right Ear: Hearing normal.  Left Ear: Hearing normal.  Nose: Nose normal.  Eyes: Conjunctivae and lids are normal. Right eye exhibits no discharge. Left eye exhibits no discharge. No scleral icterus.    Cardiovascular: Normal rate, regular rhythm, normal heart sounds and intact distal pulses.  Exam reveals no gallop and no friction rub.   No murmur heard. Pulmonary/Chest: Effort normal and breath sounds normal. No respiratory distress. She has no wheezes. She has no rales. She exhibits no tenderness.  Musculoskeletal: Normal range of motion.  Neurological: She is alert and oriented to person, place, and time.  Skin: Skin is warm, dry and intact. No rash noted. There is erythema. No pallor.  Hot, erythematous, very tender under L breast, no areas of fluctuance  Psychiatric: She has a normal mood and affect. Her speech is normal and behavior is normal. Judgment and thought content normal. Cognition and memory are normal.  Nursing note and vitals reviewed.   Results for orders placed or performed in visit on 11/03/15  Basic metabolic panel  Result Value Ref Range   Glucose 87 65 - 99 mg/dL   BUN 9 6 - 24 mg/dL   Creatinine, Ser 4.09 0.57 - 1.00 mg/dL   GFR calc non Af Amer 103 >59 mL/min/1.73   GFR calc Af Amer 119 >59 mL/min/1.73   BUN/Creatinine Ratio 14 9 - 23   Sodium 140 134 - 144 mmol/L   Potassium 3.9 3.5 - 5.2 mmol/L   Chloride 100 96 - 106 mmol/L   CO2 21 18 - 29 mmol/L   Calcium 9.2 8.7 - 10.2 mg/dL  Microalbumin, Urine Waived  Result Value Ref Range   Microalb, Ur Waived 10 0 -  19 mg/L   Creatinine, Urine Waived 200 10 - 300 mg/dL   Microalb/Creat Ratio <30 <30 mg/g      Assessment & Plan:   Problem List Items Addressed This Visit    None    Visit Diagnoses    Cellulitis of left breast    -  Primary   Will treat with bactrim. Recheck Thursday to make sure no consolidation. Call with any concerns.        Follow up plan: Return Thursday- recheck breast.

## 2016-09-30 ENCOUNTER — Encounter: Payer: Self-pay | Admitting: Family Medicine

## 2016-09-30 ENCOUNTER — Ambulatory Visit (INDEPENDENT_AMBULATORY_CARE_PROVIDER_SITE_OTHER): Payer: BLUE CROSS/BLUE SHIELD | Admitting: Family Medicine

## 2016-09-30 VITALS — BP 121/83 | HR 108 | Temp 98.6°F

## 2016-09-30 DIAGNOSIS — I10 Essential (primary) hypertension: Secondary | ICD-10-CM

## 2016-09-30 DIAGNOSIS — N61 Mastitis without abscess: Secondary | ICD-10-CM

## 2016-09-30 MED ORDER — METOPROLOL SUCCINATE ER 25 MG PO TB24: 25.0000 mg | ORAL_TABLET | Freq: Every day | ORAL | 3 refills | Status: DC

## 2016-09-30 NOTE — Patient Instructions (Addendum)
Metoprolol extended-release tablets What is this medicine? METOPROLOL (me TOE proe lole) is a beta-blocker. Beta-blockers reduce the workload on the heart and help it to beat more regularly. This medicine is used to treat high blood pressure and to prevent chest pain. It is also used to after a heart attack and to prevent an additional heart attack from occurring. This medicine may be used for other purposes; ask your health care provider or pharmacist if you have questions. COMMON BRAND NAME(S): toprol, Toprol XL What should I tell my health care provider before I take this medicine? They need to know if you have any of these conditions: -diabetes -heart or vessel disease like slow heart rate, worsening heart failure, heart block, sick sinus syndrome or Raynaud's disease -kidney disease -liver disease -lung or breathing disease, like asthma or emphysema -pheochromocytoma -thyroid disease -an unusual or allergic reaction to metoprolol, other beta-blockers, medicines, foods, dyes, or preservatives -pregnant or trying to get pregnant -breast-feeding How should I use this medicine? Take this medicine by mouth with a glass of water. Follow the directions on the prescription label. Do not crush or chew. Take this medicine with or immediately after meals. Take your doses at regular intervals. Do not take more medicine than directed. Do not stop taking this medicine suddenly. This could lead to serious heart-related effects. Talk to your pediatrician regarding the use of this medicine in children. While this drug may be prescribed for children as young as 6 years for selected conditions, precautions do apply. Overdosage: If you think you have taken too much of this medicine contact a poison control center or emergency room at once. NOTE: This medicine is only for you. Do not share this medicine with others. What if I miss a dose? If you miss a dose, take it as soon as you can. If it is almost time  for your next dose, take only that dose. Do not take double or extra doses. What may interact with this medicine? This medicine may interact with the following medications: -certain medicines for blood pressure, heart disease, irregular heart beat -certain medicines for depression, like monoamine oxidase (MAO) inhibitors, fluoxetine, or paroxetine -clonidine -dobutamine -epinephrine -isoproterenol -reserpine This list may not describe all possible interactions. Give your health care provider a list of all the medicines, herbs, non-prescription drugs, or dietary supplements you use. Also tell them if you smoke, drink alcohol, or use illegal drugs. Some items may interact with your medicine. What should I watch for while using this medicine? Visit your doctor or health care professional for regular check ups. Contact your doctor right away if your symptoms worsen. Check your blood pressure and pulse rate regularly. Ask your health care professional what your blood pressure and pulse rate should be, and when you should contact them. You may get drowsy or dizzy. Do not drive, use machinery, or do anything that needs mental alertness until you know how this medicine affects you. Do not sit or stand up quickly, especially if you are an older patient. This reduces the risk of dizzy or fainting spells. Contact your doctor if these symptoms continue. Alcohol may interfere with the effect of this medicine. Avoid alcoholic drinks. What side effects may I notice from receiving this medicine? Side effects that you should report to your doctor or health care professional as soon as possible: -allergic reactions like skin rash, itching or hives -cold or numb hands or feet -depression -difficulty breathing -faint -fever with sore throat -irregular heartbeat, chest pain -  rapid weight gain -swollen legs or ankles Side effects that usually do not require medical attention (report to your doctor or health care  professional if they continue or are bothersome): -anxiety or nervousness -change in sex drive or performance -dry skin -headache -nightmares or trouble sleeping -short term memory loss -stomach upset or diarrhea -unusually tired This list may not describe all possible side effects. Call your doctor for medical advice about side effects. You may report side effects to FDA at 1-800-FDA-1088. Where should I keep my medicine? Keep out of the reach of children. Store at room temperature between 15 and 30 degrees C (59 and 86 degrees F). Throw away any unused medicine after the expiration date. NOTE: This sheet is a summary. It may not cover all possible information. If you have questions about this medicine, talk to your doctor, pharmacist, or health care provider.  2018 Elsevier/Gold Standard (2013-01-12 14:41:37)  

## 2016-09-30 NOTE — Progress Notes (Signed)
BP 121/83 (BP Location: Left Arm, Patient Position: Sitting, Cuff Size: Normal)   Pulse (!) 108   Temp 98.6 F (37 C)   SpO2 96%    Subjective:    Patient ID: Lindsey Drake, female    DOB: 07/01/1965, 51 y.o.   MRN: [id number]  HPI: Lindsey Drake is a 51 y.o. female  Chief Complaint  Patient presents with  . Cellulitis    Follow up   Breast feeling much better. Antibiotic working. Feeling well.   HYPERTENSION Hypertension status: Fluctuating  Satisfied with current treatment? no Duration of hypertension: chronic BP monitoring frequency:  a few times a week BP medication side effects:  no Medication compliance: excellent compliance Previous BP meds: lisinopril Aspirin: no Recurrent headaches: no Visual changes: no Palpitations: yes Dyspnea: no Chest pain: no Lower extremity edema: no Dizzy/lightheaded: no  Relevant past medical, surgical, family and social history reviewed and updated as indicated. Interim medical history since our last visit reviewed. Allergies and medications reviewed and updated.  Review of Systems  Constitutional: Negative.   Respiratory: Negative.   Cardiovascular: Negative.   Skin: Negative.   Psychiatric/Behavioral: Negative for agitation, behavioral problems, confusion, decreased concentration, dysphoric mood, hallucinations, self-injury, sleep disturbance and suicidal ideas. The patient is nervous/anxious. The patient is not hyperactive.     Per HPI unless specifically indicated above     Objective:    BP 121/83 (BP Location: Left Arm, Patient Position: Sitting, Cuff Size: Normal)   Pulse (!) 108   Temp 98.6 F (37 C)   SpO2 96%   Wt Readings from Last 3 Encounters:  09/28/16 192 lb 12.8 oz (87.5 kg)  05/04/16 192 lb (87.1 kg)  02/02/16 186 lb 12.8 oz (84.7 kg)    Physical Exam  Constitutional: She is oriented to person, place, and time. She appears well-developed and well-nourished. No distress.  HENT:  Head:  Normocephalic and atraumatic.  Right Ear: Hearing normal.  Left Ear: Hearing normal.  Nose: Nose normal.  Eyes: Conjunctivae and lids are normal. Right eye exhibits no discharge. Left eye exhibits no discharge. No scleral icterus.  Cardiovascular: Normal rate, regular rhythm, normal heart sounds and intact distal pulses.  Exam reveals no gallop and no friction rub.   No murmur heard. Pulmonary/Chest: Effort normal and breath sounds normal. No respiratory distress. She has no wheezes. She has no rales. She exhibits no tenderness.  Musculoskeletal: Normal range of motion.  Neurological: She is alert and oriented to person, place, and time.  Skin: Skin is warm, dry and intact. No rash noted. There is erythema (mild under L breast- better than previous). No pallor.  Psychiatric: She has a normal mood and affect. Her speech is normal and behavior is normal. Judgment and thought content normal. Cognition and memory are normal.  Nursing note and vitals reviewed.   Results for orders placed or performed in visit on 11/03/15  Basic metabolic panel  Result Value Ref Range   Glucose 87 65 - 99 mg/dL   BUN 9 6 - 24 mg/dL   Creatinine, Ser 7.82 0.57 - 1.00 mg/dL   GFR calc non Af Amer 103 >59 mL/min/1.73   GFR calc Af Amer 119 >59 mL/min/1.73   BUN/Creatinine Ratio 14 9 - 23   Sodium 140 134 - 144 mmol/L   Potassium 3.9 3.5 - 5.2 mmol/L   Chloride 100 96 - 106 mmol/L   CO2 21 18 - 29 mmol/L   Calcium 9.2 8.7 - 10.2 mg/dL  Microalbumin, Urine Waived  Result Value Ref Range   Microalb, Ur Waived 10 0 - 19 mg/L   Creatinine, Urine Waived 200 10 - 300 mg/dL   Microalb/Creat Ratio <30 <30 mg/g      Assessment & Plan:   Problem List Items Addressed This Visit      Cardiovascular and Mediastinum   Essential hypertension, benign - Primary    Very fluctuating. Will start her on metoprolol to help with palpitations and tachycardia and recheck in 1 month.       Relevant Medications   metoprolol  succinate (TOPROL-XL) 25 MG 24 hr tablet    Other Visit Diagnoses    Cellulitis of left breast       Resolving. Nothing to open.        Follow up plan: Return in about 4 weeks (around 10/28/2016).

## 2016-09-30 NOTE — Assessment & Plan Note (Signed)
Very fluctuating. Will start her on metoprolol to help with palpitations and tachycardia and recheck in 1 month.

## 2016-10-12 ENCOUNTER — Encounter: Payer: Self-pay | Admitting: Family Medicine

## 2016-10-12 ENCOUNTER — Other Ambulatory Visit: Payer: Self-pay | Admitting: Family Medicine

## 2016-10-12 ENCOUNTER — Ambulatory Visit
Admission: RE | Admit: 2016-10-12 | Discharge: 2016-10-12 | Disposition: A | Discharge status: Home / Self Care | Payer: BLUE CROSS/BLUE SHIELD | Source: Ambulatory Visit | Attending: Family Medicine | Admitting: Family Medicine

## 2016-10-12 DIAGNOSIS — Z1239 Encounter for other screening for malignant neoplasm of breast: Secondary | ICD-10-CM

## 2016-10-12 DIAGNOSIS — Z1231 Encounter for screening mammogram for malignant neoplasm of breast: Secondary | ICD-10-CM | POA: Insufficient documentation

## 2016-10-12 DIAGNOSIS — Z9882 Breast implant status: Secondary | ICD-10-CM | POA: Insufficient documentation

## 2016-11-04 ENCOUNTER — Encounter: Payer: Self-pay | Admitting: Family Medicine

## 2016-11-04 ENCOUNTER — Ambulatory Visit (INDEPENDENT_AMBULATORY_CARE_PROVIDER_SITE_OTHER): Payer: BLUE CROSS/BLUE SHIELD | Admitting: Family Medicine

## 2016-11-04 VITALS — BP 137/90 | HR 82 | Temp 98.4°F | Wt 194.0 lb

## 2016-11-04 DIAGNOSIS — I10 Essential (primary) hypertension: Secondary | ICD-10-CM | POA: Diagnosis not present

## 2016-11-04 MED ORDER — LISINOPRIL 5 MG PO TABS: ORAL_TABLET | ORAL | 1 refills | Status: DC

## 2016-11-04 MED ORDER — METOPROLOL SUCCINATE ER 25 MG PO TB24: 25.0000 mg | ORAL_TABLET | Freq: Every day | ORAL | 1 refills | Status: DC

## 2016-11-04 NOTE — Progress Notes (Signed)
BP 137/90 (BP Location: Left Arm, Patient Position: Sitting, Cuff Size: Large)   Pulse 82   Temp 98.4 F (36.9 C)   Wt 194 lb (88 kg)   SpO2 95%   BMI 35.48 kg/m    Subjective:    Patient ID: Lindsey Drake, female    DOB: Oct 18, 1965, 51 y.o.   MRN: [id number]  HPI: Lindsey Drake is a 51 y.o. female  Chief Complaint  Patient presents with  . Hypertension   HYPERTENSION Hypertension status: controlled  Satisfied with current treatment? yes Duration of hypertension: chronic BP monitoring frequency:  not checking BP medication side effects:  no Medication compliance: excellent compliance Previous BP meds: Aspirin: no Recurrent headaches: no Visual changes: no Palpitations: no Dyspnea: no Chest pain: no Lower extremity edema: no Dizzy/lightheaded: no  Relevant past medical, surgical, family and social history reviewed and updated as indicated. Interim medical history since our last visit reviewed. Allergies and medications reviewed and updated.  Review of Systems  Constitutional: Negative.   Respiratory: Negative.   Cardiovascular: Negative.   Psychiatric/Behavioral: Negative.     Per HPI unless specifically indicated above     Objective:    BP 137/90 (BP Location: Left Arm, Patient Position: Sitting, Cuff Size: Large)   Pulse 82   Temp 98.4 F (36.9 C)   Wt 194 lb (88 kg)   SpO2 95%   BMI 35.48 kg/m   Wt Readings from Last 3 Encounters:  11/04/16 194 lb (88 kg)  09/28/16 192 lb 12.8 oz (87.5 kg)  05/04/16 192 lb (87.1 kg)    Physical Exam  Constitutional: She is oriented to person, place, and time. She appears well-developed and well-nourished. No distress.  HENT:  Head: Normocephalic and atraumatic.  Right Ear: Hearing normal.  Left Ear: Hearing normal.  Nose: Nose normal.  Eyes: Conjunctivae and lids are normal. Right eye exhibits no discharge. Left eye exhibits no discharge. No scleral icterus.  Cardiovascular: Normal rate, regular rhythm,  normal heart sounds and intact distal pulses.  Exam reveals no gallop and no friction rub.   No murmur heard. Pulmonary/Chest: Effort normal and breath sounds normal. No respiratory distress. She has no wheezes. She has no rales. She exhibits no tenderness.  Musculoskeletal: Normal range of motion.  Neurological: She is alert and oriented to person, place, and time.  Skin: Skin is warm, dry and intact. No rash noted. She is not diaphoretic. No erythema. No pallor.  Psychiatric: She has a normal mood and affect. Her speech is normal and behavior is normal. Judgment and thought content normal. Cognition and memory are normal.  Nursing note and vitals reviewed.   Results for orders placed or performed in visit on 11/03/15  Basic metabolic panel  Result Value Ref Range   Glucose 87 65 - 99 mg/dL   BUN 9 6 - 24 mg/dL   Creatinine, Ser 4.09 0.57 - 1.00 mg/dL   GFR calc non Af Amer 103 >59 mL/min/1.73   GFR calc Af Amer 119 >59 mL/min/1.73   BUN/Creatinine Ratio 14 9 - 23   Sodium 140 134 - 144 mmol/L   Potassium 3.9 3.5 - 5.2 mmol/L   Chloride 100 96 - 106 mmol/L   CO2 21 18 - 29 mmol/L   Calcium 9.2 8.7 - 10.2 mg/dL  Microalbumin, Urine Waived  Result Value Ref Range   Microalb, Ur Waived 10 0 - 19 mg/L   Creatinine, Urine Waived 200 10 - 300 mg/dL   Microalb/Creat  Ratio <30 <30 mg/g      Assessment & Plan:   Problem List Items Addressed This Visit      Cardiovascular and Mediastinum   Essential hypertension, benign - Primary    Under good control. Continue current regimen. Continue to monitor. Call with any concerns.       Relevant Medications   metoprolol succinate (TOPROL-XL) 25 MG 24 hr tablet   lisinopril (PRINIVIL,ZESTRIL) 5 MG tablet   Other Relevant Orders   Basic metabolic panel       Follow up plan: Return in about 6 months (around 05/06/2017) for Physical.

## 2016-11-04 NOTE — Assessment & Plan Note (Signed)
Under good control. Continue current regimen. Continue to monitor. Call with any concerns. 

## 2016-11-04 NOTE — Patient Instructions (Addendum)
Ganglion Cyst A ganglion cyst is a noncancerous, fluid-filled lump that occurs near joints or tendons. The ganglion cyst grows out of a joint or the lining of a tendon. It most often develops in the hand or wrist, but it can also develop in the shoulder, elbow, hip, knee, ankle, or foot. The round or oval ganglion cyst can be the size of a pea or larger than a grape. Increased activity may enlarge the size of the cyst because more fluid starts to build up. What are the causes? It is not known what causes a ganglion cyst to grow. However, it may be related to:  Inflammation or irritation around the joint.  An injury.  Repetitive movements or overuse.  Arthritis.  What increases the risk? Risk factors include:  Being a woman.  Being age 20-50.  What are the signs or symptoms? Symptoms may include:  A lump. This most often appears on the hand or wrist, but it can occur in other areas of the body.  Tingling.  Pain.  Numbness.  Muscle weakness.  Weak grip.  Less movement in a joint.  How is this diagnosed? Ganglion cysts are most often diagnosed based on a physical exam. Your health care provider will feel the lump and may shine a light alongside it. If it is a ganglion cyst, a light often shines through it. Your health care provider may order an X-ray, ultrasound, or MRI to rule out other conditions. How is this treated? Ganglion cysts usually go away on their own without treatment. If pain or other symptoms are involved, treatment may be needed. Treatment is also needed if the ganglion cyst limits your movement or if it gets infected. Treatment may include:  Wearing a brace or splint on your wrist or finger.  Taking anti-inflammatory medicine.  Draining fluid from the lump with a needle (aspiration).  Injecting a steroid into the joint.  Surgery to remove the ganglion cyst.  Follow these instructions at home:  Do not press on the ganglion cyst, poke it with a  needle, or hit it.  Take medicines only as directed by your health care provider.  Wear your brace or splint as directed by your health care provider.  Watch your ganglion cyst for any changes.  Keep all follow-up visits as directed by your health care provider. This is important. Contact a health care provider if:  Your ganglion cyst becomes larger or more painful.  You have increased redness, red streaks, or swelling.  You have pus coming from the lump.  You have weakness or numbness in the affected area.  You have a fever or chills. This information is not intended to replace advice given to you by your health care provider. Make sure you discuss any questions you have with your health care provider. Document Released: 05/07/2000 Document Revised: 10/16/2015 Document Reviewed: 10/23/2013 [employer] Interactive Patient Education  2018 [employer]  

## 2016-11-05 ENCOUNTER — Encounter: Payer: Self-pay | Admitting: Family Medicine

## 2016-11-05 LAB — BASIC METABOLIC PANEL
BUN/Creatinine Ratio: 14 (ref 9–23)
BUN: 12 mg/dL (ref 6–24)
CO2: 24 mmol/L (ref 20–29)
Calcium: 9.6 mg/dL (ref 8.7–10.2)
Chloride: 100 mmol/L (ref 96–106)
Creatinine, Ser: 0.84 mg/dL (ref 0.57–1.00)
GFR calc Af Amer: 93 mL/min/{1.73_m2} (ref >59)
GFR calc non Af Amer: 81 mL/min/{1.73_m2} (ref >59)
Glucose: 81 mg/dL (ref 65–99)
Potassium: 4.2 mmol/L (ref 3.5–5.2)
Sodium: 140 mmol/L (ref 134–144)

## 2016-12-28 ENCOUNTER — Other Ambulatory Visit: Payer: Self-pay | Admitting: Family Medicine

## 2017-03-02 ENCOUNTER — Telehealth: Payer: Self-pay | Admitting: General Surgery

## 2017-03-02 ENCOUNTER — Other Ambulatory Visit: Payer: Self-pay | Admitting: *Deleted

## 2017-03-02 DIAGNOSIS — R109 Unspecified abdominal pain: Secondary | ICD-10-CM

## 2017-03-02 DIAGNOSIS — M5414 Radiculopathy, thoracic region: Secondary | ICD-10-CM

## 2017-03-02 NOTE — Telephone Encounter (Signed)
PATIENT CALLED STATING DR BYRNETT HAD REFERRED HER TO [facility] BACK IN February.THEY CONTACTED THE PATIENT & SHE DID NOT WANT TO MAKE THE APPOINTMENT THEN.SHE HAS CONTACTED THEIR OFFICE AND THEY ONLY HOLD A REFERRAL FOR 3 MONTHS.THEY NEED A NEW REFERRAL TO SCHEDULE AN APPOINTMENT.

## 2017-03-02 NOTE — Telephone Encounter (Signed)
Referral entered in EPIC.   Per Shawna Orleans, no need for our office to call patient as she will be receiving a call from [facility] in regards to arranging an appointment.

## 2017-03-17 ENCOUNTER — Ambulatory Visit: Payer: BLUE CROSS/BLUE SHIELD | Attending: Nurse Practitioner | Admitting: Nurse Practitioner

## 2017-03-17 ENCOUNTER — Encounter: Payer: Self-pay | Admitting: Nurse Practitioner

## 2017-03-17 DIAGNOSIS — G8929 Other chronic pain: Secondary | ICD-10-CM | POA: Insufficient documentation

## 2017-03-17 DIAGNOSIS — I10 Essential (primary) hypertension: Secondary | ICD-10-CM | POA: Diagnosis not present

## 2017-03-17 DIAGNOSIS — M545 Low back pain: Secondary | ICD-10-CM | POA: Insufficient documentation

## 2017-03-17 DIAGNOSIS — M533 Sacrococcygeal disorders, not elsewhere classified: Secondary | ICD-10-CM | POA: Insufficient documentation

## 2017-03-17 DIAGNOSIS — R109 Unspecified abdominal pain: Secondary | ICD-10-CM | POA: Diagnosis not present

## 2017-03-17 DIAGNOSIS — Z789 Other specified health status: Secondary | ICD-10-CM | POA: Diagnosis not present

## 2017-03-17 DIAGNOSIS — F1190 Opioid use, unspecified, uncomplicated: Secondary | ICD-10-CM | POA: Diagnosis not present

## 2017-03-17 DIAGNOSIS — K219 Gastro-esophageal reflux disease without esophagitis: Secondary | ICD-10-CM | POA: Insufficient documentation

## 2017-03-17 DIAGNOSIS — M5442 Lumbago with sciatica, left side: Secondary | ICD-10-CM

## 2017-03-17 DIAGNOSIS — M791 Myalgia, unspecified site: Secondary | ICD-10-CM | POA: Insufficient documentation

## 2017-03-17 DIAGNOSIS — Z79899 Other long term (current) drug therapy: Secondary | ICD-10-CM

## 2017-03-17 NOTE — Progress Notes (Signed)
Safety precautions to be maintained throughout the outpatient stay will include: orient to surroundings, keep bed in low position, maintain call bell within reach at all times, provide assistance with transfer out of bed and ambulation.  

## 2017-03-17 NOTE — Patient Instructions (Signed)

## 2017-03-17 NOTE — Progress Notes (Signed)
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 consideration: Ms. Tung was informed that there is no guarantee that she would be a candidate for interventional therapies. The decision will be based on the results of diagnostic studies, as well as Ms. Stephani's risk profile.  Possible procedure(s): Diagnostic right sided Celiac Plexus NB Diagnostic Bilateral Lumbar LESI  Diagnostic Bilateral Lumbar Facet NB Possible Bilateral Lumbar Facet RFA Diagnostic Bilateral SI joint injection Diagnostic Bilateral SI NB Possible Bilateral SI RFA   Provider-requested follow-up: Return for 2nd Visit, w/ Dr. Dossie Arbour, after [facility] eval.  Future Appointments Date Time Provider [place]  05/10/2017 3:00 PM Valerie Roys, DO [facility]    Primary Care Physician: Valerie Roys, DO Location: [facility] Note by:  Date: 03/17/2017; Time: 3:28 PM  Pain Score Disclaimer: We use  the NRS-11 scale. This is a self-reported, subjective measurement of pain severity with only modest accuracy. It is used primarily to identify changes within a particular patient. It must be understood that outpatient pain scales are significantly less accurate that those used for research, where they can be applied under ideal controlled circumstances with minimal exposure to variables. In reality, the score is likely to be a combination of pain intensity and pain affect, where pain affect describes the degree of emotional arousal or changes in action readiness caused by the sensory experience of pain. Factors such as social and work situation, setting, emotional state, anxiety levels,  expectation, and prior pain experience may influence pain perception and show large inter-individual differences that may also be affected by time variables.  Patient instructions provided during this appointment: Patient Instructions    ____________________________________________________________________________________________  Appointment Policy Summary  It is our goal and responsibility to provide the medical community with assistance in the evaluation and management of patients with chronic pain. Unfortunately our resources are limited. Because we do not have an unlimited amount of time, or available appointments, we are required to closely monitor and manage their use. The following rules exist to maximize their use:  Patient's responsibilities: 1. Punctuality:  At what time should I arrive? You should be physically present in our office 30 minutes before your scheduled appointment. Your scheduled appointment is with your assigned healthcare provider. However, it takes 5-10 minutes to be "checked-in", and another 15 minutes for the nurses to do the admission. If you arrive to our office at the time you were given for your appointment, you will end up being at least 20-25 minutes late to your appointment with the  provider. 2. Tardiness:  What happens if I arrive only a few minutes after my scheduled appointment time? You will need to reschedule your appointment. The cutoff is your appointment time. This is why it is so important that you arrive at least 30 minutes before that appointment. If you have an appointment scheduled for 10:00 AM and you arrive at 10:01, you will be required to reschedule your appointment.  3. Plan ahead:  Always assume that you will encounter traffic on your way in. Plan for it. If you are dependent on a driver, make sure they understand these rules and the need to arrive early. 4. Other appointments and responsibilities:  Avoid scheduling any other appointments before or after your pain clinic appointments.  5. Be prepared:  Write down everything that you need to discuss with your healthcare provider and give this information to the admitting nurse. Write down the medications that you will need refilled. Bring your pills and bottles (even the empty ones), to all of your appointments, except for those where a procedure is scheduled. 6. No children or pets:  Find someone to take care of them. It is not appropriate to bring them in. 7. Scheduling changes:  We request "advanced notification" of any changes or cancellations. 8. Advanced notification:  Defined as a time period of more than 24 hours prior to the originally scheduled appointment. This allows for the appointment to be offered to other patients. 9. Rescheduling:  When a visit is rescheduled, it will require the cancellation of the original appointment. For this reason they both fall within the category of "Cancellations".  10. Cancellations:  They require advanced notification. Any cancellation less than 24 hours before the  appointment will be recorded as a "No Show". 11. No Show:  Defined as an unkept appointment where the patient failed to notify or declare to the practice their intention or inability to keep the  appointment.  Corrective process for repeat offenders:  1. Tardiness: Three (3) episodes of rescheduling due to late arrivals will be recorded as one (1) "No Show". 2. Cancellation or reschedule: Three (3) cancellations or rescheduling will be recorded as one (1) "No Show". 3. "No Shows": Three (3) "No Shows" within a 12 month period will result in discharge from the practice.  ____________________________________________________________________________________________  ____________________________________________________________________________________________  Pain Scale  Introduction: The pain score used by this practice is the Verbal Numerical Rating Scale (VNRS-11). This is  an 11-point scale. It is for adults and children 10 years or older. There are significant differences in how the pain score is reported, used, and applied. Forget everything you learned in the past and learn this scoring system.  General Information: The scale should reflect your current level of pain. Unless you are specifically asked for the level of your worst pain, or your average pain. If you are asked for one of these two, then it should be understood that it is over the past 24 hours.  Basic Activities of Daily Living (ADL): Personal hygiene, dressing, eating, transferring, and using restroom.  Instructions: Most patients tend to report their level of pain as a combination of two factors, their physical pain and their psychosocial pain. This last one is also known as "suffering" and it is reflection of how physical pain affects you socially and psychologically. From now on, report them separately. From this point on, when asked to report your pain level, report only your physical pain. Use the following table for reference.  Pain Clinic Pain Levels (0-5/10)  Pain Level Score  Description  No Pain 0   Mild pain 1 Nagging, annoying, but does not interfere with basic activities of daily living (ADL). Patients are  able to eat, bathe, get dressed, toileting (being able to get on and off the toilet and perform personal hygiene functions), transfer (move in and out of bed or a chair without assistance), and maintain continence (able to control bladder and bowel functions). Blood pressure and heart rate are unaffected. A normal heart rate for a healthy adult ranges from 60 to 100 bpm (beats per minute).   Mild to moderate pain 2 Noticeable and distracting. Impossible to hide from other people. More frequent flare-ups. Still possible to adapt and function close to normal. It can be very annoying and may have occasional stronger flare-ups. With discipline, patients may get used to it and adapt.   Moderate pain 3 Interferes significantly with activities of daily living (ADL). It becomes difficult to feed, bathe, get dressed, get on and off the toilet or to perform personal hygiene functions. Difficult to get in and out of bed or a chair without assistance. Very distracting. With effort, it can be ignored when deeply involved in activities.   Moderately severe pain 4 Impossible to ignore for more than a few minutes. With effort, patients may still be able to manage work or participate in some social activities. Very difficult to concentrate. Signs of autonomic nervous system discharge are evident: dilated pupils (mydriasis); mild sweating (diaphoresis); sleep interference. Heart rate becomes elevated (>115 bpm). Diastolic blood pressure (lower number) rises above 100 mmHg. Patients find relief in laying down and not moving.   Severe pain 5 Intense and extremely unpleasant. Associated with frowning face and frequent crying. Pain overwhelms the senses.  Ability to do any activity or maintain social relationships becomes significantly limited. Conversation becomes difficult. Pacing back and forth is common, as getting into a comfortable position is nearly impossible. Pain wakes you up from deep sleep. Physical signs will be  obvious: pupillary dilation; increased sweating; goosebumps; brisk reflexes; cold, clammy hands and feet; nausea, vomiting or dry heaves; loss of appetite; significant sleep disturbance with inability to fall asleep or to remain asleep. When persistent, significant weight loss is observed due to the complete loss of appetite and sleep deprivation.  Blood pressure and heart rate becomes significantly elevated. Caution: If elevated blood pressure triggers a pounding headache associated with blurred vision, then  the patient should immediately seek attention at an urgent or emergency care unit, as these may be signs of an impending stroke.    Emergency Department Pain Levels (6-10/10)  Emergency Room Pain 6 Severely limiting. Requires emergency care and should not be seen or managed at an outpatient pain management facility. Communication becomes difficult and requires great effort. Assistance to reach the emergency department may be required. Facial flushing and profuse sweating along with potentially dangerous increases in heart rate and blood pressure will be evident.   Distressing pain 7 Self-care is very difficult. Assistance is required to transport, or use restroom. Assistance to reach the emergency department will be required. Tasks requiring coordination, such as bathing and getting dressed become very difficult.   Disabling pain 8 Self-care is no longer possible. At this level, pain is disabling. The individual is unable to do even the most "basic" activities such as walking, eating, bathing, dressing, transferring to a bed, or toileting. Fine motor skills are lost. It is difficult to think clearly.   Incapacitating pain 9 Pain becomes incapacitating. Thought processing is no longer possible. Difficult to remember your own name. Control of movement and coordination are lost.   The worst pain imaginable 10 At this level, most patients pass out from pain. When this level is reached, collapse of the  autonomic nervous system occurs, leading to a sudden drop in blood pressure and heart rate. This in turn results in a temporary and dramatic drop in blood flow to the brain, leading to a loss of consciousness. Fainting is one of the body's self defense mechanisms. Passing out puts the brain in a calmed state and causes it to shut down for a while, in order to begin the healing process.    Summary: 1. Refer to this scale when providing [place] with your pain level. 2. Be accurate and careful when reporting your pain level. This will help with your care. 3. Over-reporting your pain level will lead to loss of credibility. 4. Even a level of 1/10 means that there is pain and will be treated at our facility. 5. High, inaccurate reporting will be documented as "Symptom Exaggeration", leading to loss of credibility and suspicions of possible secondary gains such as obtaining more narcotics, or wanting to appear disabled, for fraudulent reasons. 6. Only pain levels of 5 or below will be seen at our facility. 7. Pain levels of 6 and above will be sent to the Emergency Department and the appointment cancelled. ____________________________________________________________________________________________

## 2017-03-22 LAB — 25-HYDROXY VITAMIN D LCMS D2+D3
25-Hydroxy, Vitamin D-2: <1 ng/mL
25-Hydroxy, Vitamin D-3: 33 ng/mL
25-Hydroxy, Vitamin D: 33 ng/mL

## 2017-03-22 LAB — CBC
Hematocrit: 37.6 % (ref 34.0–46.6)
Hemoglobin: 12.6 g/dL (ref 11.1–15.9)
MCH: 29.8 pg (ref 26.6–33.0)
MCHC: 33.5 g/dL (ref 31.5–35.7)
MCV: 89 fL (ref 79–97)
Platelets: 309 10*3/uL (ref 150–379)
RBC: 4.23 x10E6/uL (ref 3.77–5.28)
RDW: 14 % (ref 12.3–15.4)
WBC: 12.2 10*3/uL — ABNORMAL HIGH (ref 3.4–10.8)

## 2017-03-22 LAB — COMP. METABOLIC PANEL (12)
AST: 21 IU/L (ref 0–40)
Albumin/Globulin Ratio: 1.8 (ref 1.2–2.2)
Albumin: 4.3 g/dL (ref 3.5–5.5)
Alkaline Phosphatase: 60 IU/L (ref 39–117)
BUN/Creatinine Ratio: 18 (ref 9–23)
BUN: 15 mg/dL (ref 6–24)
Bilirubin Total: <0.2 mg/dL (ref 0.0–1.2)
Calcium: 9.5 mg/dL (ref 8.7–10.2)
Chloride: 104 mmol/L (ref 96–106)
Creatinine, Ser: 0.84 mg/dL (ref 0.57–1.00)
GFR calc Af Amer: 93 mL/min/{1.73_m2} (ref >59)
GFR calc non Af Amer: 81 mL/min/{1.73_m2} (ref >59)
Globulin, Total: 2.4 g/dL (ref 1.5–4.5)
Glucose: 96 mg/dL (ref 65–99)
Potassium: 4.2 mmol/L (ref 3.5–5.2)
Sodium: 143 mmol/L (ref 134–144)
Total Protein: 6.7 g/dL (ref 6.0–8.5)

## 2017-03-22 LAB — MAGNESIUM: Magnesium: 2.2 mg/dL (ref 1.6–2.3)

## 2017-03-22 LAB — VITAMIN B12: Vitamin B-12: 591 pg/mL (ref 232–1245)

## 2017-03-22 LAB — SEDIMENTATION RATE: Sed Rate: 7 mm/hr (ref 0–40)

## 2017-03-22 LAB — C-REACTIVE PROTEIN: CRP: 17.1 mg/L — ABNORMAL HIGH (ref 0.0–4.9)

## 2017-03-23 ENCOUNTER — Telehealth: Payer: Self-pay | Admitting: *Deleted

## 2017-03-23 LAB — COMPLIANCE DRUG ANALYSIS, UR

## 2017-03-23 NOTE — Telephone Encounter (Signed)
Patient called and instructed to come into the clinic for lab draw.

## 2017-03-24 ENCOUNTER — Other Ambulatory Visit: Payer: Self-pay | Admitting: Nurse Practitioner

## 2017-03-24 DIAGNOSIS — D72829 Elevated white blood cell count, unspecified: Secondary | ICD-10-CM | POA: Insufficient documentation

## 2017-03-25 LAB — CBC WITH DIFFERENTIAL
Basophils Absolute: 0 10*3/uL (ref 0.0–0.2)
Basos: 0 %
EOS (ABSOLUTE): 0.1 10*3/uL (ref 0.0–0.4)
Eos: 1 %
Hematocrit: 37 % (ref 34.0–46.6)
Hemoglobin: 12.4 g/dL (ref 11.1–15.9)
Immature Grans (Abs): 0 10*3/uL (ref 0.0–0.1)
Immature Granulocytes: 0 %
Lymphocytes Absolute: 4.6 10*3/uL — ABNORMAL HIGH (ref 0.7–3.1)
Lymphs: 40 %
MCH: 29.6 pg (ref 26.6–33.0)
MCHC: 33.5 g/dL (ref 31.5–35.7)
MCV: 88 fL (ref 79–97)
Monocytes Absolute: 0.7 10*3/uL (ref 0.1–0.9)
Monocytes: 6 %
Neutrophils Absolute: 6 10*3/uL (ref 1.4–7.0)
Neutrophils: 53 %
RBC: 4.19 x10E6/uL (ref 3.77–5.28)
RDW: 14 % (ref 12.3–15.4)
WBC: 11.3 10*3/uL — ABNORMAL HIGH (ref 3.4–10.8)

## 2017-04-08 ENCOUNTER — Ambulatory Visit
Admission: RE | Admit: 2017-04-08 | Discharge: 2017-04-08 | Disposition: A | Discharge status: Home / Self Care | Payer: BLUE CROSS/BLUE SHIELD | Source: Ambulatory Visit | Attending: Nurse Practitioner | Admitting: Nurse Practitioner

## 2017-04-08 DIAGNOSIS — R1031 Right lower quadrant pain: Secondary | ICD-10-CM | POA: Diagnosis not present

## 2017-04-08 DIAGNOSIS — M545 Low back pain: Secondary | ICD-10-CM | POA: Diagnosis not present

## 2017-04-08 DIAGNOSIS — M791 Myalgia, unspecified site: Secondary | ICD-10-CM

## 2017-04-08 DIAGNOSIS — G8929 Other chronic pain: Secondary | ICD-10-CM | POA: Diagnosis not present

## 2017-04-08 DIAGNOSIS — R1011 Right upper quadrant pain: Secondary | ICD-10-CM | POA: Diagnosis not present

## 2017-04-08 DIAGNOSIS — M47816 Spondylosis without myelopathy or radiculopathy, lumbar region: Secondary | ICD-10-CM | POA: Diagnosis not present

## 2017-04-08 DIAGNOSIS — R109 Unspecified abdominal pain: Secondary | ICD-10-CM

## 2017-04-11 NOTE — Progress Notes (Signed)
Results were reviewed and found to be: Minimal abnormal  No acute injury or pathology identified  Review would suggest interventional pain management techniques may be of benefit

## 2017-05-10 ENCOUNTER — Encounter: Payer: BLUE CROSS/BLUE SHIELD | Admitting: Family Medicine

## 2017-06-09 ENCOUNTER — Encounter: Payer: Self-pay | Admitting: Family Medicine

## 2017-06-09 ENCOUNTER — Ambulatory Visit (INDEPENDENT_AMBULATORY_CARE_PROVIDER_SITE_OTHER): Payer: BLUE CROSS/BLUE SHIELD | Admitting: Family Medicine

## 2017-06-09 VITALS — BP 165/101 | HR 91 | Temp 98.1°F | Ht 63.4 in | Wt 192.2 lb

## 2017-06-09 DIAGNOSIS — F411 Generalized anxiety disorder: Secondary | ICD-10-CM | POA: Diagnosis not present

## 2017-06-09 DIAGNOSIS — R300 Dysuria: Secondary | ICD-10-CM

## 2017-06-09 DIAGNOSIS — D72829 Elevated white blood cell count, unspecified: Secondary | ICD-10-CM | POA: Diagnosis not present

## 2017-06-09 DIAGNOSIS — Z13220 Encounter for screening for lipoid disorders: Secondary | ICD-10-CM

## 2017-06-09 DIAGNOSIS — Z0000 Encounter for general adult medical examination without abnormal findings: Secondary | ICD-10-CM | POA: Diagnosis not present

## 2017-06-09 DIAGNOSIS — I10 Essential (primary) hypertension: Secondary | ICD-10-CM | POA: Diagnosis not present

## 2017-06-09 DIAGNOSIS — Z1239 Encounter for other screening for malignant neoplasm of breast: Secondary | ICD-10-CM

## 2017-06-09 DIAGNOSIS — Z23 Encounter for immunization: Secondary | ICD-10-CM | POA: Diagnosis not present

## 2017-06-09 DIAGNOSIS — N3001 Acute cystitis with hematuria: Secondary | ICD-10-CM

## 2017-06-09 MED ORDER — METOPROLOL SUCCINATE ER 25 MG PO TB24: 25.0000 mg | ORAL_TABLET | Freq: Every day | ORAL | 1 refills | Status: DC

## 2017-06-09 MED ORDER — LISINOPRIL 10 MG PO TABS: 10.0000 mg | ORAL_TABLET | Freq: Every day | ORAL | 1 refills | Status: DC

## 2017-06-09 MED ORDER — FLUTICASONE PROPIONATE 50 MCG/ACT NA SUSP: 2.0000 | Freq: Every day | NASAL | 6 refills | Status: DC

## 2017-06-09 MED ORDER — BUSPIRONE HCL 5 MG PO TABS: 5.0000 mg | ORAL_TABLET | Freq: Two times a day (BID) | ORAL | 3 refills | Status: DC

## 2017-06-09 MED ORDER — NITROFURANTOIN MONOHYD MACRO 100 MG PO CAPS: 100.0000 mg | ORAL_CAPSULE | Freq: Two times a day (BID) | ORAL | 0 refills | Status: DC

## 2017-06-09 NOTE — Assessment & Plan Note (Signed)
Not under good control. Will add buspar and recheck in 1 month.

## 2017-06-09 NOTE — Assessment & Plan Note (Signed)
Not under good control. Will increase lisinopril and continue metoprolol. Recheck 1 month. Call with any concerns.

## 2017-06-09 NOTE — Assessment & Plan Note (Signed)
Rechecking levels today. Await results.  

## 2017-06-09 NOTE — Patient Instructions (Addendum)
Influenza (Flu) Vaccine (Inactivated or Recombinant): What You Need to Know 1. Why get vaccinated? Influenza ("flu") is a contagious disease that spreads around the [place] every year, usually between October and May. Flu is caused by influenza viruses, and is spread mainly by coughing, sneezing, and close contact. Anyone can get flu. Flu strikes suddenly and can last several days. Symptoms vary by age, but can include:  fever/chills  sore throat  muscle aches  fatigue  cough  headache  runny or stuffy nose  Flu can also lead to pneumonia and blood infections, and cause diarrhea and seizures in children. If you have a medical condition, such as heart or lung disease, flu can make it worse. Flu is more dangerous for some people. Infants and young children, people 23 years of age and older, pregnant women, and people with certain health conditions or a weakened immune system are at greatest risk. Each year thousands of people in the [place] die from flu, and many more are hospitalized. Flu vaccine can:  keep you from getting flu,  make flu less severe if you do get it, and  keep you from spreading flu to your family and other people. 2. Inactivated and recombinant flu vaccines A dose of flu vaccine is recommended every flu season. Children 6 months through 91 years of age may need two doses during the same flu season. Everyone else needs only one dose each flu season. Some inactivated flu vaccines contain a very small amount of a mercury-based preservative called thimerosal. Studies have not shown thimerosal in vaccines to be harmful, but flu vaccines that do not contain thimerosal are available. There is no live flu virus in flu shots. They cannot cause the flu. There are many flu viruses, and they are always changing. Each year a new flu vaccine is made to protect against three or four viruses that are likely to cause disease in the upcoming flu season. But even when the  vaccine doesn't exactly match these viruses, it may still provide some protection. Flu vaccine cannot prevent:  flu that is caused by a virus not covered by the vaccine, or  illnesses that look like flu but are not.  It takes about 2 weeks for protection to develop after vaccination, and protection lasts through the flu season. 3. Some people should not get this vaccine Tell the person who is giving you the vaccine:  If you have any severe, life-threatening allergies. If you ever had a life-threatening allergic reaction after a dose of flu vaccine, or have a severe allergy to any part of this vaccine, you may be advised not to get vaccinated. Most, but not all, types of flu vaccine contain a small amount of egg protein.  If you ever had Guillain-Barr Syndrome (also called GBS). Some people with a history of GBS should not get this vaccine. This should be discussed with your doctor.  If you are not feeling well. It is usually okay to get flu vaccine when you have a mild illness, but you might be asked to come back when you feel better.  4. Risks of a vaccine reaction With any medicine, including vaccines, there is a chance of reactions. These are usually mild and go away on their own, but serious reactions are also possible. Most people who get a flu shot do not have any problems with it. Minor problems following a flu shot include:  soreness, redness, or swelling where the shot was given  hoarseness  sore,  red or itchy eyes  cough  fever  aches  headache  itching  fatigue  If these problems occur, they usually begin soon after the shot and last 1 or 2 days. More serious problems following a flu shot can include the following:  There may be a small increased risk of Guillain-Barre Syndrome (GBS) after inactivated flu vaccine. This risk has been estimated at 1 or 2 additional cases per million people vaccinated. This is much lower than the risk of severe complications from  flu, which can be prevented by flu vaccine.  Young children who get the flu shot along with pneumococcal vaccine (PCV13) and/or DTaP vaccine at the same time might be slightly more likely to have a seizure caused by fever. Ask your doctor for more information. Tell your doctor if a child who is getting flu vaccine has ever had a seizure.  Problems that could happen after any injected vaccine:  People sometimes faint after a medical procedure, including vaccination. Sitting or lying down for about 15 minutes can help prevent fainting, and injuries caused by a fall. Tell your doctor if you feel dizzy, or have vision changes or ringing in the ears.  Some people get severe pain in the shoulder and have difficulty moving the arm where a shot was given. This happens very rarely.  Any medication can cause a severe allergic reaction. Such reactions from a vaccine are very rare, estimated at about 1 in a million doses, and would happen within a few minutes to a few hours after the vaccination. As with any medicine, there is a very remote chance of a vaccine causing a serious injury or death. The safety of vaccines is always being monitored. For more information, visit: http://www.aguilar.org/ 5. What if there is a serious reaction? What should I look for? Look for anything that concerns you, such as signs of a severe allergic reaction, very high fever, or unusual behavior. Signs of a severe allergic reaction can include hives, swelling of the face and throat, difficulty breathing, a fast heartbeat, dizziness, and weakness. These would start a few minutes to a few hours after the vaccination. What should I do?  If you think it is a severe allergic reaction or other emergency that can't wait, call 9-1-1 and get the person to the nearest hospital. Otherwise, call your doctor.  Reactions should be reported to the Vaccine Adverse Event Reporting System (VAERS). Your doctor should file this report, or you  can do it yourself through the VAERS web site at www.vaers.SamedayNews.es, or by calling 6094730752. ? VAERS does not give medical advice. 6. The National Vaccine Injury Compensation Program The [employer] Vaccine Injury Compensation Program (VICP) is a federal program that was created to compensate people who may have been injured by certain vaccines. Persons who believe they may have been injured by a vaccine can learn about the program and about filing a claim by calling 458-267-6070 or visiting the [place] website at GoldCloset.com.ee. There is a time limit to file a claim for compensation. 7. How can I learn more?  Ask your healthcare provider. He or she can give you the vaccine package insert or suggest other sources of information.  Call your local or state health department.  Contact the Centers for Disease Control and Prevention (CDC): ? Call (540)164-9661 (1-800-CDC-INFO) or ? Visit CDC's website at https://gibson.com/ Vaccine Information Statement, Inactivated Influenza Vaccine (12/28/2013) This information is not intended to replace advice given to you by your health care provider. Make sure  you discuss any questions you have with your health care provider. Document Released: 03/04/2006 Document Revised: 01/29/2016 Document Reviewed: 01/29/2016 Elsevier Interactive Patient Education  2017 [place] Maintenance, Female Adopting a healthy lifestyle and getting preventive care can go a long way to promote health and wellness. Talk with your health care provider about what schedule of regular examinations is right for you. This is a good chance for you to check in with your provider about disease prevention and staying healthy. In between checkups, there are plenty of things you can do on your own. Experts have done a lot of research about which lifestyle changes and preventive measures are most likely to keep you healthy. Ask your health care provider for more  information. Weight and diet Eat a healthy diet  Be sure to include plenty of vegetables, fruits, low-fat dairy products, and lean protein.  Do not eat a lot of foods high in solid fats, added sugars, or salt.  Get regular exercise. This is one of the most important things you can do for your health. ? Most adults should exercise for at least 150 minutes each week. The exercise should increase your heart rate and make you sweat (moderate-intensity exercise). ? Most adults should also do strengthening exercises at least twice a week. This is in addition to the moderate-intensity exercise.  Maintain a healthy weight  Body mass index (BMI) is a measurement that can be used to identify possible weight problems. It estimates body fat based on height and weight. Your health care provider can help determine your BMI and help you achieve or maintain a healthy weight.  For females 8 years of age and older: ? A BMI below 18.5 is considered underweight. ? A BMI of 18.5 to 24.9 is normal. ? A BMI of 25 to 29.9 is considered overweight. ? A BMI of 30 and above is considered obese.  Watch levels of cholesterol and blood lipids  You should start having your blood tested for lipids and cholesterol at 53 years of age, then have this test every 5 years.  You may need to have your cholesterol levels checked more often if: ? Your lipid or cholesterol levels are high. ? You are older than 53 years of age. ? You are at high risk for heart disease.  Cancer screening Lung Cancer  Lung cancer screening is recommended for adults 23-50 years old who are at high risk for lung cancer because of a history of smoking.  A yearly low-dose CT scan of the lungs is recommended for people who: ? Currently smoke. ? Have quit within the past 15 years. ? Have at least a 30-pack-year history of smoking. A pack year is smoking an average of one pack of cigarettes a day for 1 year.  Yearly screening should continue  until it has been 15 years since you quit.  Yearly screening should stop if you develop a health problem that would prevent you from having lung cancer treatment.  Breast Cancer  Practice breast self-awareness. This means understanding how your breasts normally appear and feel.  It also means doing regular breast self-exams. Let your health care provider know about any changes, no matter how small.  If you are in your 20s or 30s, you should have a clinical breast exam (CBE) by a health care provider every 1-3 years as part of a regular health exam.  If you are 31 or older, have a CBE every year. Also consider having a breast X-ray (  mammogram) every year.  If you have a family history of breast cancer, talk to your health care provider about genetic screening.  If you are at high risk for breast cancer, talk to your health care provider about having an MRI and a mammogram every year.  Breast cancer gene (BRCA) assessment is recommended for women who have family members with BRCA-related cancers. BRCA-related cancers include: ? Breast. ? Ovarian. ? Tubal. ? Peritoneal cancers.  Results of the assessment will determine the need for genetic counseling and BRCA1 and BRCA2 testing.  Cervical Cancer Your health care provider may recommend that you be screened regularly for cancer of the pelvic organs (ovaries, uterus, and vagina). This screening involves a pelvic examination, including checking for microscopic changes to the surface of your cervix (Pap test). You may be encouraged to have this screening done every 3 years, beginning at age 71.  For women ages 31-65, health care providers may recommend pelvic exams and Pap testing every 3 years, or they may recommend the Pap and pelvic exam, combined with testing for human papilloma virus (HPV), every 5 years. Some types of HPV increase your risk of cervical cancer. Testing for HPV may also be done on women of any age with unclear Pap test  results.  Other health care providers may not recommend any screening for nonpregnant women who are considered low risk for pelvic cancer and who do not have symptoms. Ask your health care provider if a screening pelvic exam is right for you.  If you have had past treatment for cervical cancer or a condition that could lead to cancer, you need Pap tests and screening for cancer for at least 20 years after your treatment. If Pap tests have been discontinued, your risk factors (such as having a new sexual partner) need to be reassessed to determine if screening should resume. Some women have medical problems that increase the chance of getting cervical cancer. In these cases, your health care provider may recommend more frequent screening and Pap tests.  Colorectal Cancer  This type of cancer can be detected and often prevented.  Routine colorectal cancer screening usually begins at 53 years of age and continues through 53 years of age.  Your health care provider may recommend screening at an earlier age if you have risk factors for colon cancer.  Your health care provider may also recommend using home test kits to check for hidden blood in the stool.  A small camera at the end of a tube can be used to examine your colon directly (sigmoidoscopy or colonoscopy). This is done to check for the earliest forms of colorectal cancer.  Routine screening usually begins at age 8.  Direct examination of the colon should be repeated every 5-10 years through 53 years of age. However, you may need to be screened more often if early forms of precancerous polyps or small growths are found.  Skin Cancer  Check your skin from head to toe regularly.  Tell your health care provider about any new moles or changes in moles, especially if there is a change in a mole's shape or color.  Also tell your health care provider if you have a mole that is larger than the size of a pencil eraser.  Always use sunscreen.  Apply sunscreen liberally and repeatedly throughout the day.  Protect yourself by wearing long sleeves, pants, a wide-brimmed hat, and sunglasses whenever you are outside.  Heart disease, diabetes, and high blood pressure  High blood pressure  causes heart disease and increases the risk of stroke. High blood pressure is more likely to develop in: ? People who have blood pressure in the high end of the normal range (130-139/85-89 mm Hg). ? People who are overweight or obese. ? People who are African American.  If you are 87-72 years of age, have your blood pressure checked every 3-5 years. If you are 45 years of age or older, have your blood pressure checked every year. You should have your blood pressure measured twice-once when you are at a hospital or clinic, and once when you are not at a hospital or clinic. Record the average of the two measurements. To check your blood pressure when you are not at a hospital or clinic, you can use: ? An automated blood pressure machine at a pharmacy. ? A home blood pressure monitor.  If you are between 48 years and 37 years old, ask your health care provider if you should take aspirin to prevent strokes.  Have regular diabetes screenings. This involves taking a blood sample to check your fasting blood sugar level. ? If you are at a normal weight and have a low risk for diabetes, have this test once every three years after 53 years of age. ? If you are overweight and have a high risk for diabetes, consider being tested at a younger age or more often. Preventing infection Hepatitis B  If you have a higher risk for hepatitis B, you should be screened for this virus. You are considered at high risk for hepatitis B if: ? You were born in a country where hepatitis B is common. Ask your health care provider which countries are considered high risk. ? Your parents were born in a high-risk country, and you have not been immunized against hepatitis B (hepatitis B  vaccine). ? You have HIV or AIDS. ? You use needles to inject street drugs. ? You live with someone who has hepatitis B. ? You have had sex with someone who has hepatitis B. ? You get hemodialysis treatment. ? You take certain medicines for conditions, including cancer, organ transplantation, and autoimmune conditions.  Hepatitis C  Blood testing is recommended for: ? Everyone born from [birth year] through 1966. ? Anyone with known risk factors for hepatitis C.  Sexually transmitted infections (STIs)  You should be screened for sexually transmitted infections (STIs) including gonorrhea and chlamydia if: ? You are sexually active and are younger than 53 years of age. ? You are older than 53 years of age and your health care provider tells you that you are at risk for this type of infection. ? Your sexual activity has changed since you were last screened and you are at an increased risk for chlamydia or gonorrhea. Ask your health care provider if you are at risk.  If you do not have HIV, but are at risk, it may be recommended that you take a prescription medicine daily to prevent HIV infection. This is called pre-exposure prophylaxis (PrEP). You are considered at risk if: ? You are sexually active and do not regularly use condoms or know the HIV status of your partner(s). ? You take drugs by injection. ? You are sexually active with a partner who has HIV.  Talk with your health care provider about whether you are at high risk of being infected with HIV. If you choose to begin PrEP, you should first be tested for HIV. You should then be tested every 3 months for as long as  you are taking PrEP. Pregnancy  If you are premenopausal and you may become pregnant, ask your health care provider about preconception counseling.  If you may become pregnant, take 400 to 800 micrograms (mcg) of folic acid every day.  If you want to prevent pregnancy, talk to your health care provider about birth control  (contraception). Osteoporosis and menopause  Osteoporosis is a disease in which the bones lose minerals and strength with aging. This can result in serious bone fractures. Your risk for osteoporosis can be identified using a bone density scan.  If you are 25 years of age or older, or if you are at risk for osteoporosis and fractures, ask your health care provider if you should be screened.  Ask your health care provider whether you should take a calcium or vitamin D supplement to lower your risk for osteoporosis.  Menopause may have certain physical symptoms and risks.  Hormone replacement therapy may reduce some of these symptoms and risks. Talk to your health care provider about whether hormone replacement therapy is right for you. Follow these instructions at home:  Schedule regular health, dental, and eye exams.  Stay current with your immunizations.  Do not use any tobacco products including cigarettes, chewing tobacco, or electronic cigarettes.  If you are pregnant, do not drink alcohol.  If you are breastfeeding, limit how much and how often you drink alcohol.  Limit alcohol intake to no more than 1 drink per day for nonpregnant women. One drink equals 12 ounces of beer, 5 ounces of wine, or 1 ounces of hard liquor.  Do not use street drugs.  Do not share needles.  Ask your health care provider for help if you need support or information about quitting drugs.  Tell your health care provider if you often feel depressed.  Tell your health care provider if you have ever been abused or do not feel safe at home. This information is not intended to replace advice given to you by your health care provider. Make sure you discuss any questions you have with your health care provider. Document Released: 11/23/2010 Document Revised: 10/16/2015 Document Reviewed: 02/11/2015 Elsevier Interactive Patient Education  2018 [place] Maintenance for Postmenopausal  Women Menopause is a normal process in which your reproductive ability comes to an end. This process happens gradually over a span of months to years, usually between the ages of 49 and 21. Menopause is complete when you have missed 12 consecutive menstrual periods. It is important to talk with your health care provider about some of the most common conditions that affect postmenopausal women, such as heart disease, cancer, and bone loss (osteoporosis). Adopting a healthy lifestyle and getting preventive care can help to promote your health and wellness. Those actions can also lower your chances of developing some of these common conditions. What should I know about menopause? During menopause, you may experience a number of symptoms, such as:  Moderate-to-severe hot flashes.  Night sweats.  Decrease in sex drive.  Mood swings.  Headaches.  Tiredness.  Irritability.  Memory problems.  Insomnia.  Choosing to treat or not to treat menopausal changes is an individual decision that you make with your health care provider. What should I know about hormone replacement therapy and supplements? Hormone therapy products are effective for treating symptoms that are associated with menopause, such as hot flashes and night sweats. Hormone replacement carries certain risks, especially as you become older. If you are thinking about using estrogen or estrogen  with progestin treatments, discuss the benefits and risks with your health care provider. What should I know about heart disease and stroke? Heart disease, heart attack, and stroke become more likely as you age. This may be due, in part, to the hormonal changes that your body experiences during menopause. These can affect how your body processes dietary fats, triglycerides, and cholesterol. Heart attack and stroke are both medical emergencies. There are many things that you can do to help prevent heart disease and stroke:  Have your blood  pressure checked at least every 1-2 years. High blood pressure causes heart disease and increases the risk of stroke.  If you are 40-41 years old, ask your health care provider if you should take aspirin to prevent a heart attack or a stroke.  Do not use any tobacco products, including cigarettes, chewing tobacco, or electronic cigarettes. If you need help quitting, ask your health care provider.  It is important to eat a healthy diet and maintain a healthy weight. ? Be sure to include plenty of vegetables, fruits, low-fat dairy products, and lean protein. ? Avoid eating foods that are high in solid fats, added sugars, or salt (sodium).  Get regular exercise. This is one of the most important things that you can do for your health. ? Try to exercise for at least 150 minutes each week. The type of exercise that you do should increase your heart rate and make you sweat. This is known as moderate-intensity exercise. ? Try to do strengthening exercises at least twice each week. Do these in addition to the moderate-intensity exercise.  Know your numbers.Ask your health care provider to check your cholesterol and your blood glucose. Continue to have your blood tested as directed by your health care provider.  What should I know about cancer screening? There are several types of cancer. Take the following steps to reduce your risk and to catch any cancer development as early as possible. Breast Cancer  Practice breast self-awareness. ? This means understanding how your breasts normally appear and feel. ? It also means doing regular breast self-exams. Let your health care provider know about any changes, no matter how small.  If you are 97 or older, have a clinician do a breast exam (clinical breast exam or CBE) every year. Depending on your age, family history, and medical history, it may be recommended that you also have a yearly breast X-ray (mammogram).  If you have a family history of breast  cancer, talk with your health care provider about genetic screening.  If you are at high risk for breast cancer, talk with your health care provider about having an MRI and a mammogram every year.  Breast cancer (BRCA) gene test is recommended for women who have family members with BRCA-related cancers. Results of the assessment will determine the need for genetic counseling and BRCA1 and for BRCA2 testing. BRCA-related cancers include these types: ? Breast. This occurs in males or females. ? Ovarian. ? Tubal. This may also be called fallopian tube cancer. ? Cancer of the abdominal or pelvic lining (peritoneal cancer). ? Prostate. ? Pancreatic.  Cervical, Uterine, and Ovarian Cancer Your health care provider may recommend that you be screened regularly for cancer of the pelvic organs. These include your ovaries, uterus, and vagina. This screening involves a pelvic exam, which includes checking for microscopic changes to the surface of your cervix (Pap test).  For women ages 21-65, health care providers may recommend a pelvic exam and a Pap test  every three years. For women ages 105-65, they may recommend the Pap test and pelvic exam, combined with testing for human papilloma virus (HPV), every five years. Some types of HPV increase your risk of cervical cancer. Testing for HPV may also be done on women of any age who have unclear Pap test results.  Other health care providers may not recommend any screening for nonpregnant women who are considered low risk for pelvic cancer and have no symptoms. Ask your health care provider if a screening pelvic exam is right for you.  If you have had past treatment for cervical cancer or a condition that could lead to cancer, you need Pap tests and screening for cancer for at least 20 years after your treatment. If Pap tests have been discontinued for you, your risk factors (such as having a new sexual partner) need to be reassessed to determine if you should  start having screenings again. Some women have medical problems that increase the chance of getting cervical cancer. In these cases, your health care provider may recommend that you have screening and Pap tests more often.  If you have a family history of uterine cancer or ovarian cancer, talk with your health care provider about genetic screening.  If you have vaginal bleeding after reaching menopause, tell your health care provider.  There are currently no reliable tests available to screen for ovarian cancer.  Lung Cancer Lung cancer screening is recommended for adults 56-55 years old who are at high risk for lung cancer because of a history of smoking. A yearly low-dose CT scan of the lungs is recommended if you:  Currently smoke.  Have a history of at least 30 pack-years of smoking and you currently smoke or have quit within the past 15 years. A pack-year is smoking an average of one pack of cigarettes per day for one year.  Yearly screening should:  Continue until it has been 15 years since you quit.  Stop if you develop a health problem that would prevent you from having lung cancer treatment.  Colorectal Cancer  This type of cancer can be detected and can often be prevented.  Routine colorectal cancer screening usually begins at age 63 and continues through age 75.  If you have risk factors for colon cancer, your health care provider may recommend that you be screened at an earlier age.  If you have a family history of colorectal cancer, talk with your health care provider about genetic screening.  Your health care provider may also recommend using home test kits to check for hidden blood in your stool.  A small camera at the end of a tube can be used to examine your colon directly (sigmoidoscopy or colonoscopy). This is done to check for the earliest forms of colorectal cancer.  Direct examination of the colon should be repeated every 5-10 years until age 64. However, if  early forms of precancerous polyps or small growths are found or if you have a family history or genetic risk for colorectal cancer, you may need to be screened more often.  Skin Cancer  Check your skin from head to toe regularly.  Monitor any moles. Be sure to tell your health care provider: ? About any new moles or changes in moles, especially if there is a change in a mole's shape or color. ? If you have a mole that is larger than the size of a pencil eraser.  If any of your family members has a history of  skin cancer, especially at a young age, talk with your health care provider about genetic screening.  Always use sunscreen. Apply sunscreen liberally and repeatedly throughout the day.  Whenever you are outside, protect yourself by wearing long sleeves, pants, a wide-brimmed hat, and sunglasses.  What should I know about osteoporosis? Osteoporosis is a condition in which bone destruction happens more quickly than new bone creation. After menopause, you may be at an increased risk for osteoporosis. To help prevent osteoporosis or the bone fractures that can happen because of osteoporosis, the following is recommended:  If you are 41-33 years old, get at least 1,000 mg of calcium and at least 600 mg of vitamin D per day.  If you are older than age 22 but younger than age 7, get at least 1,200 mg of calcium and at least 600 mg of vitamin D per day.  If you are older than age 85, get at least 1,200 mg of calcium and at least 800 mg of vitamin D per day.  Smoking and excessive alcohol intake increase the risk of osteoporosis. Eat foods that are rich in calcium and vitamin D, and do weight-bearing exercises several times each week as directed by your health care provider. What should I know about how menopause affects my mental health? Depression may occur at any age, but it is more common as you become older. Common symptoms of depression include:  Low or sad mood.  Changes in sleep  patterns.  Changes in appetite or eating patterns.  Feeling an overall lack of motivation or enjoyment of activities that you previously enjoyed.  Frequent crying spells.  Talk with your health care provider if you think that you are experiencing depression. What should I know about immunizations? It is important that you get and maintain your immunizations. These include:  Tetanus, diphtheria, and pertussis (Tdap) booster vaccine.  Influenza every year before the flu season begins.  Pneumonia vaccine.  Shingles vaccine.  Your health care provider may also recommend other immunizations. This information is not intended to replace advice given to you by your health care provider. Make sure you discuss any questions you have with your health care provider. Document Released: 07/02/2005 Document Revised: 11/28/2015 Document Reviewed: 02/11/2015 Elsevier Interactive Patient Education  2018 [employer].
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
## 2017-06-10 ENCOUNTER — Telehealth: Payer: Self-pay | Admitting: Family Medicine

## 2017-06-10 LAB — COMPREHENSIVE METABOLIC PANEL
ALT: 22 IU/L (ref 0–32)
AST: 25 IU/L (ref 0–40)
Albumin/Globulin Ratio: 1.8 (ref 1.2–2.2)
Albumin: 4.2 g/dL (ref 3.5–5.5)
Alkaline Phosphatase: 68 IU/L (ref 39–117)
BUN/Creatinine Ratio: 22 (ref 9–23)
BUN: 15 mg/dL (ref 6–24)
Bilirubin Total: 0.2 mg/dL (ref 0.0–1.2)
CO2: 21 mmol/L (ref 20–29)
Calcium: 9 mg/dL (ref 8.7–10.2)
Chloride: 103 mmol/L (ref 96–106)
Creatinine, Ser: 0.68 mg/dL (ref 0.57–1.00)
GFR calc Af Amer: 116 mL/min/{1.73_m2} (ref >59)
GFR calc non Af Amer: 101 mL/min/{1.73_m2} (ref >59)
Globulin, Total: 2.4 g/dL (ref 1.5–4.5)
Glucose: 85 mg/dL (ref 65–99)
Potassium: 3.7 mmol/L (ref 3.5–5.2)
Sodium: 141 mmol/L (ref 134–144)
Total Protein: 6.6 g/dL (ref 6.0–8.5)

## 2017-06-10 LAB — CBC WITH DIFFERENTIAL/PLATELET
Basophils Absolute: 0 10*3/uL (ref 0.0–0.2)
Basos: 0 %
EOS (ABSOLUTE): 0.2 10*3/uL (ref 0.0–0.4)
Eos: 1 %
Hematocrit: 37.2 % (ref 34.0–46.6)
Hemoglobin: 12.8 g/dL (ref 11.1–15.9)
Immature Grans (Abs): 0 10*3/uL (ref 0.0–0.1)
Immature Granulocytes: 0 %
Lymphocytes Absolute: 4.5 10*3/uL — ABNORMAL HIGH (ref 0.7–3.1)
Lymphs: 35 %
MCH: 30.1 pg (ref 26.6–33.0)
MCHC: 34.4 g/dL (ref 31.5–35.7)
MCV: 88 fL (ref 79–97)
Monocytes Absolute: 0.8 10*3/uL (ref 0.1–0.9)
Monocytes: 6 %
Neutrophils Absolute: 7.4 10*3/uL — ABNORMAL HIGH (ref 1.4–7.0)
Neutrophils: 58 %
Platelets: 336 10*3/uL (ref 150–379)
RBC: 4.25 x10E6/uL (ref 3.77–5.28)
RDW: 13.7 % (ref 12.3–15.4)
WBC: 12.8 10*3/uL — ABNORMAL HIGH (ref 3.4–10.8)

## 2017-06-10 LAB — LIPID PANEL W/O CHOL/HDL RATIO
Cholesterol, Total: 261 mg/dL — ABNORMAL HIGH (ref 100–199)
HDL: 50 mg/dL (ref >39)
LDL Calculated: 166 mg/dL — ABNORMAL HIGH (ref 0–99)
Triglycerides: 226 mg/dL — ABNORMAL HIGH (ref 0–149)
VLDL Cholesterol Cal: 45 mg/dL — ABNORMAL HIGH (ref 5–40)

## 2017-06-10 LAB — TSH: TSH: 1.82 u[IU]/mL (ref 0.450–4.500)

## 2017-06-10 NOTE — Telephone Encounter (Signed)
Patient notified via voicemail.

## 2017-06-10 NOTE — Telephone Encounter (Signed)
Please let her know that her labs came back normal except her white blood count came back high and her cholesterol came back really high. We'll recheck them next month, but if they're still high, we should probably do something about them. Thanks!

## 2017-06-13 LAB — URINE CULTURE, REFLEX

## 2017-06-13 LAB — UA/M W/RFLX CULTURE, ROUTINE
Bilirubin, UA: NEGATIVE
Glucose, UA: NEGATIVE
Ketones, UA: NEGATIVE
Nitrite, UA: NEGATIVE
Protein, UA: NEGATIVE
Specific Gravity, UA: 1.015 (ref 1.005–1.030)
Urobilinogen, Ur: 0.2 mg/dL (ref 0.2–1.0)
pH, UA: 5.5 (ref 5.0–7.5)

## 2017-06-13 LAB — MICROALBUMIN, URINE WAIVED
Creatinine, Urine Waived: 100 mg/dL (ref 10–300)
Microalb, Ur Waived: 80 mg/L — ABNORMAL HIGH (ref 0–19)

## 2017-06-13 LAB — MICROSCOPIC EXAMINATION

## 2017-06-13 NOTE — Progress Notes (Signed)
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  Visit Diagnosis: 1. Chronic pain syndrome   2. Chronic abdominal pain (Primary Area of Pain) (Right)   3. Abdominal wall pain in right upper quadrant   4. Chronic thoracic radiculopathy (Right)   5. Thoracic spondylosis   6. 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 Thoracic spondylosis with radiculopathy   8. Chronic low back pain (Secondary Area of Pain) (Left)   9. Osteoarthritis of lumbar spine   10. Osteoarthritis of lumbar facet joint (Bilateral)   11. Chronic sacroiliac joint pain (Left)   12. Lumbar facet syndrome  (Bilateral) (R>L)   13. Chronic hip pain (Left)   14. Chronic lower extremity pain (Tertiary Area of Pain) (Left)   15. Chronic lumbar radicular pain (S1) (Left)   16. Chronic musculoskeletal pain   17. Neurogenic pain   18. Disorder of skeletal system   19. Pharmacologic therapy   20. Problems influencing health status    Problems updated and reviewed during this visit: Problem  Chronic Musculoskeletal Pain  Abdominal Wall Pain in Right Upper Quadrant  Thoracic Spondylosis With Radiculopathy  Chronic thoracic radiculopathy (Right)  Chronic hip pain (Left)  Chronic lower extremity pain (Tertiary Area of Pain) (Left)  Chronic lumbar radicular pain (S1) (Left)  Chronic low back pain (Secondary Area of Pain) (Bilateral) (L>R)  Chronic sacroiliac joint pain (Bilateral) (L>R)    Time Note: Greater than 50% of the 40 minute(s) of face-to-face time spent with Ms. Cahue, was spent in counseling/coordination of care regarding: Ms. Lechuga primary cause of pain, the results of her recent test(s), the significance of each one oth the test(s) anomalies and it's corresponding characteristic pain pattern(s), the treatment plan, treatment alternatives, realistic expectations, the goals of pain management (increased in functionality) and the need to collect and read the AVS material.  Plan of Care  Pharmacotherapy (Medications Ordered): Meds ordered this encounter  Medications  . gabapentin (NEURONTIN) 100 MG capsule    Sig: Take 1-3 capsules (100-300 mg total) by mouth at bedtime. Follow written titration schedule.    Dispense:  90 capsule    Refill:  0    Do not place medication on "Automatic Refill". Fill one day early if pharmacy is closed on scheduled refill date.  . orphenadrine (NORFLEX) injection 60 mg  . ketorolac (TORADOL) injection 60 mg    Procedure Orders     Thoracic Epidural Injection Lab Orders  No laboratory test(s) ordered today   Imaging Orders  No imaging studies  ordered today   Referral Orders  No referral(s) requested today    Pharmacological management options:  Opioid Analgesics: We'll take over management today. See above orders Membrane stabilizer: Neurontin 100 mg at bedtime trial with instructions to titrate up as tolerated.  Written titration instructions provided to the patient today. Muscle relaxant: None prescribed at this time NSAID: None prescribed at this time Other analgesic(s): To be determined at a later time   Interventional management options: Planned, scheduled, and/or pending:    Diagnostic right-sided T6-7 thoracic epidural steroid injection under fluoroscopic guidance and IV sedation Neurontin trial Toradol/Norflex 60/60 IM injection today   Considering:   Diagnostic right-sided T5-6 thoracic epidural steroid injection  Diagnostic right-sided abdominal wall block  Diagnostic right sided Celiac  Plexus block  Diagnostic bilateral Lumbar Facet block  Possible bilateral Lumbar Facet RFA  Diagnostic bilateral SI joint injection  Possible bilateral SI RFA    PRN Procedures:   None at this time   Provider-requested follow-up: Return for Procedure (w/ sedation): (R) T6-7 TESI.  Future Appointments  Date Time Provider [place]  07/12/2017  3:30 PM Valerie Roys, DO [facility]    Primary Care Physician: Valerie Roys, DO Location: [facility] Note by: Gaspar Cola, MD Date: 06/15/2017; Time: 10:16 AM

## 2017-06-14 DIAGNOSIS — Z789 Other specified health status: Secondary | ICD-10-CM | POA: Insufficient documentation

## 2017-06-14 DIAGNOSIS — B351 Tinea unguium: Secondary | ICD-10-CM | POA: Insufficient documentation

## 2017-06-14 DIAGNOSIS — M792 Neuralgia and neuritis, unspecified: Secondary | ICD-10-CM | POA: Insufficient documentation

## 2017-06-14 DIAGNOSIS — M47814 Spondylosis without myelopathy or radiculopathy, thoracic region: Secondary | ICD-10-CM | POA: Insufficient documentation

## 2017-06-14 DIAGNOSIS — G894 Chronic pain syndrome: Secondary | ICD-10-CM | POA: Insufficient documentation

## 2017-06-14 DIAGNOSIS — M47816 Spondylosis without myelopathy or radiculopathy, lumbar region: Secondary | ICD-10-CM | POA: Insufficient documentation

## 2017-06-14 DIAGNOSIS — Z981 Arthrodesis status: Secondary | ICD-10-CM | POA: Insufficient documentation

## 2017-06-14 DIAGNOSIS — M5124 Other intervertebral disc displacement, thoracic region: Secondary | ICD-10-CM | POA: Insufficient documentation

## 2017-06-14 DIAGNOSIS — K5730 Diverticulosis of large intestine without perforation or abscess without bleeding: Secondary | ICD-10-CM | POA: Insufficient documentation

## 2017-06-14 DIAGNOSIS — M899 Disorder of bone, unspecified: Secondary | ICD-10-CM | POA: Insufficient documentation

## 2017-06-14 DIAGNOSIS — Z79899 Other long term (current) drug therapy: Secondary | ICD-10-CM | POA: Insufficient documentation

## 2017-06-15 ENCOUNTER — Ambulatory Visit: Payer: BLUE CROSS/BLUE SHIELD | Attending: Pain Medicine | Admitting: Pain Medicine

## 2017-06-15 ENCOUNTER — Other Ambulatory Visit: Payer: Self-pay

## 2017-06-15 ENCOUNTER — Encounter: Payer: Self-pay | Admitting: Pain Medicine

## 2017-06-15 VITALS — BP 146/99 | HR 99 | Temp 97.8°F | Resp 16 | Ht 62.0 in | Wt 188.0 lb

## 2017-06-15 DIAGNOSIS — M5414 Radiculopathy, thoracic region: Secondary | ICD-10-CM

## 2017-06-15 DIAGNOSIS — M5410 Radiculopathy, site unspecified: Secondary | ICD-10-CM

## 2017-06-15 DIAGNOSIS — M7918 Myalgia, other site: Secondary | ICD-10-CM | POA: Insufficient documentation

## 2017-06-15 DIAGNOSIS — M5124 Other intervertebral disc displacement, thoracic region: Secondary | ICD-10-CM

## 2017-06-15 DIAGNOSIS — G8929 Other chronic pain: Secondary | ICD-10-CM | POA: Insufficient documentation

## 2017-06-15 DIAGNOSIS — M545 Low back pain, unspecified: Secondary | ICD-10-CM

## 2017-06-15 DIAGNOSIS — M79605 Pain in left leg: Secondary | ICD-10-CM

## 2017-06-15 DIAGNOSIS — Z79899 Other long term (current) drug therapy: Secondary | ICD-10-CM

## 2017-06-15 DIAGNOSIS — R109 Unspecified abdominal pain: Secondary | ICD-10-CM | POA: Diagnosis not present

## 2017-06-15 DIAGNOSIS — M25552 Pain in left hip: Secondary | ICD-10-CM | POA: Diagnosis not present

## 2017-06-15 DIAGNOSIS — M4724 Other spondylosis with radiculopathy, thoracic region: Secondary | ICD-10-CM | POA: Diagnosis not present

## 2017-06-15 DIAGNOSIS — M899 Disorder of bone, unspecified: Secondary | ICD-10-CM

## 2017-06-15 DIAGNOSIS — M47816 Spondylosis without myelopathy or radiculopathy, lumbar region: Secondary | ICD-10-CM

## 2017-06-15 DIAGNOSIS — M792 Neuralgia and neuritis, unspecified: Secondary | ICD-10-CM

## 2017-06-15 DIAGNOSIS — R1011 Right upper quadrant pain: Secondary | ICD-10-CM | POA: Insufficient documentation

## 2017-06-15 DIAGNOSIS — Z789 Other specified health status: Secondary | ICD-10-CM

## 2017-06-15 DIAGNOSIS — M533 Sacrococcygeal disorders, not elsewhere classified: Secondary | ICD-10-CM

## 2017-06-15 DIAGNOSIS — G894 Chronic pain syndrome: Secondary | ICD-10-CM | POA: Insufficient documentation

## 2017-06-15 DIAGNOSIS — M47814 Spondylosis without myelopathy or radiculopathy, thoracic region: Secondary | ICD-10-CM

## 2017-06-15 MED ORDER — GABAPENTIN 100 MG PO CAPS: 100.0000 mg | ORAL_CAPSULE | Freq: Every day | ORAL | 0 refills | Status: DC

## 2017-06-15 MED ORDER — ORPHENADRINE CITRATE 30 MG/ML IJ SOLN
INTRAMUSCULAR | Status: AC
  Filled 2017-06-15: qty 2

## 2017-06-15 MED ORDER — KETOROLAC TROMETHAMINE 60 MG/2ML IM SOLN
60.0000 mg | Freq: Once | INTRAMUSCULAR | Status: AC
  Administered 2017-06-15: 60 mg via INTRAMUSCULAR

## 2017-06-15 MED ORDER — KETOROLAC TROMETHAMINE 60 MG/2ML IM SOLN
INTRAMUSCULAR | Status: AC
  Filled 2017-06-15: qty 2

## 2017-06-15 MED ORDER — ORPHENADRINE CITRATE 30 MG/ML IJ SOLN
60.0000 mg | Freq: Once | INTRAMUSCULAR | Status: AC
  Administered 2017-06-15: 60 mg via INTRAMUSCULAR

## 2017-06-15 NOTE — Patient Instructions (Addendum)
____________________________________________________________________________________________  Preparing for Procedure with Sedation Instructions: . Oral Intake: Do not eat or drink anything for at least 8 hours prior to your procedure. . Transportation: Public transportation is not allowed. Bring an adult driver. The driver must be physically present in our waiting room before any procedure can be started. Marland Kitchen Physical Assistance: Bring an adult physically capable of assisting you, in the event you need help. This adult should keep you company at home for at least 6 hours after the procedure. . Blood Pressure Medicine: Take your blood pressure medicine with a sip of water the morning of the procedure. . Blood thinners:  . Diabetics on insulin: Notify the staff so that you can be scheduled 1st case in the morning. If your diabetes requires high dose insulin, take only  of your normal insulin dose the morning of the procedure and notify the staff that you have done so. . Preventing infections: Shower with an antibacterial soap the morning of your procedure. . Build-up your immune system: Take 1000 mg of Vitamin C with every meal (3 times a day) the day prior to your procedure. Marland Kitchen Antibiotics: Inform the staff if you have a condition or reason that requires you to take antibiotics before dental procedures. . Pregnancy: If you are pregnant, call and cancel the procedure. . Sickness: If you have a cold, fever, or any active infections, call and cancel the procedure. . Arrival: You must be in the facility at least 30 minutes prior to your scheduled procedure. . Children: Do not bring children with you. . Dress appropriately: Bring dark clothing that you would not mind if they get stained. . Valuables: Do not bring any jewelry or valuables. Procedure appointments are reserved for interventional treatments only. Marland Kitchen No Prescription Refills. . No medication changes will be discussed during procedure  appointments. . No disability issues will be discussed. ____________________________________________________________________________________________   ____________________________________________________________________________________________  Initial Gabapentin Titration  Medication used: Gabapentin (Generic Name) or Neurontin (Brand Name) 100 mg tablets/capsules  Reasons to stop increasing the dose:  Reason 1: You get good relief of symptoms, in which case there is no need to increase the daily dose any further.    Reason 2: You develop some side effects, such as sleeping all of the time, difficulty concentrating, or becoming disoriented, in which case you need to go down on the dose, to the prior level, where you were not experiencing any side effects. Stay on that dose longer, to allow more time for your body to get use it, before attempting to increase it again.   Steps: Step 1: Start by taking 1 (one) tablet at bedtime x 7 (seven) days.  Step 2: After being on 1 (one) tablet for 7 (seven) days, then increase it to 2 (two) tablets at bedtime for another 7 (seven) days.  Step 3: Next, after being on 2 (two) tablets at bedtime for 7 (seven) days, then increase it to 3 (three) tablets at bedtime, and stay on that dose until you see your doctor.  Reasons to stop increasing the dose: Reason 1: You get good relief of symptoms, in which case there is no need to increase the daily dose any further.  Reason 2: You develop some side effects, such as sleeping all of the time, difficulty concentrating, or becoming disoriented, in which case you need to go down on the dose, to the prior level, where you were not experiencing any side effects. Stay on that dose longer, to allow more time for  your body to get use it, before attempting to increase it again.  Endpoint: Once you have reached the maximum dose you can tolerate without side-effects, contact your physician so as to evaluate the results  of the regimen.   Questions: Feel free to contact us for any questions or problems at [phone] ____________________________________________________________________________________________ GENERAL RISKS AND COMPLICATIONS  What are the risk, side effects and possible complications? Generally speaking, most procedures are safe.  However, with any procedure there are risks, side effects, and the possibility of complications.  The risks and complications are dependent upon the sites that are lesioned, or the type of nerve block to be performed.  The closer the procedure is to the spine, the more serious the risks are.  Great care is taken when placing the radio frequency needles, block needles or lesioning probes, but sometimes complications can occur. 1. Infection: Any time there is an injection through the skin, there is a risk of infection.  This is why sterile conditions are used for these blocks.  There are four possible types of infection. 1. Localized skin infection. 2. Central Nervous System Infection-This can be in the form of Meningitis, which can be deadly. 3. Epidural Infections-This can be in the form of an epidural abscess, which can cause pressure inside of the spine, causing compression of the spinal cord with subsequent paralysis. This would require an emergency surgery to decompress, and there are no guarantees that the patient would recover from the paralysis. 4. Discitis-This is an infection of the intervertebral discs.  It occurs in about 1% of discography procedures.  It is difficult to treat and it may lead to surgery.        2. Pain: the needles have to go through skin and soft tissues, will cause soreness.       3. Damage to internal structures:  The nerves to be lesioned may be near blood vessels or    other nerves which can be potentially damaged.       4. Bleeding: Bleeding is more common if the patient is taking blood thinners such as  aspirin, Coumadin, Ticiid, Plavix,  etc., or if he/she have some genetic predisposition  such as hemophilia. Bleeding into the spinal canal can cause compression of the spinal  cord with subsequent paralysis.  This would require an emergency surgery to  decompress and there are no guarantees that the patient would recover from the  paralysis.       5. Pneumothorax:  Puncturing of a lung is a possibility, every time a needle is introduced in  the area of the chest or upper back.  Pneumothorax refers to free air around the  collapsed lung(s), inside of the thoracic cavity (chest cavity).  Another two possible  complications related to a similar event would include: Hemothorax and Chylothorax.   These are variations of the Pneumothorax, where instead of air around the collapsed  lung(s), you may have blood or chyle, respectively.       6. Spinal headaches: They may occur with any procedures in the area of the spine.       7. Persistent CSF (Cerebro-Spinal Fluid) leakage: This is a rare problem, but may occur  with prolonged intrathecal or epidural catheters either due to the formation of a fistulous  track or a dural tear.       8. Nerve damage: By working so close to the spinal cord, there is always a possibility of  nerve damage, which could be  as serious as a permanent spinal cord injury with  paralysis.       9. Death:  Although rare, severe deadly allergic reactions known as "Anaphylactic  reaction" can occur to any of the medications used.      10. Worsening of the symptoms:  We can always make thing worse.  What are the chances of something like this happening? Chances of any of this occuring are extremely low.  By statistics, you have more of a chance of getting killed in a motor vehicle accident: while driving to the hospital than any of the above occurring .  Nevertheless, you should be aware that they are possibilities.  In general, it is similar to taking a shower.  Everybody knows that you can slip, hit your head and get killed.   Does that mean that you should not shower again?  Nevertheless always keep in mind that statistics do not mean anything if you happen to be on the wrong side of them.  Even if a procedure has a 1 (one) in a 1,000,000 (million) chance of going wrong, it you happen to be that one..Also, keep in mind that by statistics, you have more of a chance of having something go wrong when taking medications.  Who should not have this procedure? If you are on a blood thinning medication (e.g. Coumadin, Plavix, see list of "Blood Thinners"), or if you have an active infection going on, you should not have the procedure.  If you are taking any blood thinners, please inform your physician.  How should I prepare for this procedure?  Do not eat or drink anything at least six hours prior to the procedure.  Bring a driver with you .  It cannot be a taxi.  Come accompanied by an adult that can drive you back, and that is strong enough to help you if your legs get weak or numb from the local anesthetic.  Take all of your medicines the morning of the procedure with just enough water to swallow them.  If you have diabetes, make sure that you are scheduled to have your procedure done first thing in the morning, whenever possible.  If you have diabetes, take only half of your insulin dose and notify our nurse that you have done so as soon as you arrive at the clinic.  If you are diabetic, but only take blood sugar pills (oral hypoglycemic), then do not take them on the morning of your procedure.  You may take them after you have had the procedure.  Do not take aspirin or any aspirin-containing medications, at least eleven (11) days prior to the procedure.  They may prolong bleeding.  Wear loose fitting clothing that may be easy to take off and that you would not mind if it got stained with Betadine or blood.  Do not wear any jewelry or perfume  Remove any nail coloring.  It will interfere with some of our monitoring  equipment.  NOTE: Remember that this is not meant to be interpreted as a complete list of all possible complications.  Unforeseen problems may occur.  BLOOD THINNERS The following drugs contain aspirin or other products, which can cause increased bleeding during surgery and should not be taken for 2 weeks prior to and 1 week after surgery.  If you should need take something for relief of minor pain, you may take acetaminophen which is found in Tylenol,m Datril, Anacin-3 and Panadol. It is not blood thinner. The products listed below  are.  Do not take any of the products listed below in addition to any listed on your instruction sheet.  A.P.C or A.P.C with Codeine Codeine Phosphate Capsules #3 Ibuprofen Ridaura  ABC compound Congesprin Imuran rimadil  Advil Cope Indocin Robaxisal  Alka-Seltzer Effervescent Pain Reliever and Antacid Coricidin or Coricidin-D  Indomethacin Rufen  Alka-Seltzer plus Cold Medicine Cosprin Ketoprofen S-A-C Tablets  Anacin Analgesic Tablets or Capsules Coumadin Korlgesic Salflex  Anacin Extra Strength Analgesic tablets or capsules CP-2 Tablets Lanoril Salicylate  Anaprox Cuprimine Capsules Levenox Salocol  Anexsia-D Dalteparin Magan Salsalate  Anodynos Darvon compound Magnesium Salicylate Sine-off  Ansaid Dasin Capsules Magsal Sodium Salicylate  Anturane Depen Capsules Marnal Soma  APF Arthritis pain formula Dewitt's Pills Measurin Stanback  Argesic Dia-Gesic Meclofenamic Sulfinpyrazone  Arthritis Bayer Timed Release Aspirin Diclofenac Meclomen Sulindac  Arthritis pain formula Anacin Dicumarol Medipren Supac  Analgesic (Safety coated) Arthralgen Diffunasal Mefanamic Suprofen  Arthritis Strength Bufferin Dihydrocodeine Mepro Compound Suprol  Arthropan liquid Dopirydamole Methcarbomol with Aspirin Synalgos  ASA tablets/Enseals Disalcid Micrainin Tagament  Ascriptin Doan's Midol Talwin  Ascriptin A/D Dolene Mobidin Tanderil  Ascriptin Extra Strength Dolobid  Moblgesic Ticlid  Ascriptin with Codeine Doloprin or Doloprin with Codeine Momentum Tolectin  Asperbuf Duoprin Mono-gesic Trendar  Aspergum Duradyne Motrin or Motrin IB Triminicin  Aspirin plain, buffered or enteric coated Durasal Myochrisine Trigesic  Aspirin Suppositories Easprin Nalfon Trillsate  Aspirin with Codeine Ecotrin Regular or Extra Strength Naprosyn Uracel  Atromid-S Efficin Naproxen Ursinus  Auranofin Capsules Elmiron Neocylate Vanquish  Axotal Emagrin Norgesic Verin  Azathioprine Empirin or Empirin with Codeine Normiflo Vitamin E  Azolid Emprazil Nuprin Voltaren  Bayer Aspirin plain, buffered or children's or timed BC Tablets or powders Encaprin Orgaran Warfarin Sodium  Buff-a-Comp Enoxaparin Orudis Zorpin  Buff-a-Comp with Codeine Equegesic Os-Cal-Gesic   Buffaprin Excedrin plain, buffered or Extra Strength Oxalid   Bufferin Arthritis Strength Feldene Oxphenbutazone   Bufferin plain or Extra Strength Feldene Capsules Oxycodone with Aspirin   Bufferin with Codeine Fenoprofen Fenoprofen Pabalate or Pabalate-SF   Buffets II Flogesic Panagesic   Buffinol plain or Extra Strength Florinal or Florinal with Codeine Panwarfarin   Buf-Tabs Flurbiprofen Penicillamine   Butalbital Compound Four-way cold tablets Penicillin   Butazolidin Fragmin Pepto-Bismol   Carbenicillin Geminisyn Percodan   Carna Arthritis Reliever Geopen Persantine   Carprofen Gold's salt Persistin   Chloramphenicol Goody's Phenylbutazone   Chloromycetin Haltrain Piroxlcam   Clmetidine heparin Plaquenil   Cllnoril Hyco-pap Ponstel   Clofibrate Hydroxy chloroquine Propoxyphen         Before stopping any of these medications, be sure to consult the physician who ordered them.  Some, such as Coumadin (Warfarin) are ordered to prevent or treat serious conditions such as "deep thrombosis", "pumonary embolisms", and other heart problems.  The amount of time that you may need off of the medication may also vary with  the medication and the reason for which you were taking it.  If you are taking any of these medications, please make sure you notify your pain physician before you undergo any procedures.          Epidural Steroid Injection An epidural steroid injection is a shot of steroid medicine and numbing medicine that is given into the space between the spinal cord and the bones in your back (epidural space). The shot helps relieve pain caused by an irritated or swollen nerve root. The amount of pain relief you get from the injection depends on what is causing the nerve to be  swollen and irritated, and how long your pain lasts. You are more likely to benefit from this injection if your pain is strong and comes on suddenly rather than if you have had pain for a long time. Tell a health care provider about:  Any allergies you have.  All medicines you are taking, including vitamins, herbs, eye drops, creams, and over-the-counter medicines.  Any problems you or family members have had with anesthetic medicines.  Any blood disorders you have.  Any surgeries you have had.  Any medical conditions you have.  Whether you are pregnant or may be pregnant. What are the risks? Generally, this is a safe procedure. However, problems may occur, including:  Headache.  Bleeding.  Infection.  Allergic reaction to medicines.  Damage to your nerves.  What happens before the procedure? Staying hydrated Follow instructions from your health care provider about hydration, which may include:  Up to 2 hours before the procedure - you may continue to drink clear liquids, such as water, clear fruit juice, black coffee, and plain tea.  Eating and drinking restrictions Follow instructions from your health care provider about eating and drinking, which may include:  8 hours before the procedure - stop eating heavy meals or foods such as meat, fried foods, or fatty foods.  6 hours before the procedure -  stop eating light meals or foods, such as toast or cereal.  6 hours before the procedure - stop drinking milk or drinks that contain milk.  2 hours before the procedure - stop drinking clear liquids.  Medicine  You may be given medicines to lower anxiety.  Ask your health care provider about: ? Changing or stopping your regular medicines. This is especially important if you are taking diabetes medicines or blood thinners. ? Taking medicines such as aspirin and ibuprofen. These medicines can thin your blood. Do not take these medicines before your procedure if your health care provider instructs you not to. General instructions  Plan to have someone take you home from the hospital or clinic. What happens during the procedure?  You may receive a medicine to help you relax (sedative).  You will be asked to lie on your abdomen.  The injection site will be cleaned.  A numbing medicine (local anesthetic) will be used to numb the injection site.  A needle will be inserted through your skin into the epidural space. You may feel some discomfort when this happens. An X-ray machine will be used to make sure the needle is put as close as possible to the affected nerve.  A steroid medicine and a local anesthetic will be injected into the epidural space.  The needle will be removed.  A bandage (dressing) will be put over the injection site. What happens after the procedure?  Your blood pressure, heart rate, breathing rate, and blood oxygen level will be monitored until the medicines you were given have worn off.  Your arm or leg may feel weak or numb for a few hours.  The injection site may feel sore.  Do not drive for 24 hours if you received a sedative. This information is not intended to replace advice given to you by your health care provider. Make sure you discuss any questions you have with your health care provider. Document Released: 08/17/2007 Document Revised: 10/22/2015  Document Reviewed: 08/26/2015 Elsevier Interactive Patient Education  [employer].

## 2017-07-12 ENCOUNTER — Ambulatory Visit: Payer: BLUE CROSS/BLUE SHIELD | Admitting: Family Medicine

## 2017-07-12 ENCOUNTER — Encounter: Payer: Self-pay | Admitting: Family Medicine

## 2017-07-12 VITALS — BP 130/86 | HR 109 | Wt 194.0 lb

## 2017-07-12 DIAGNOSIS — F411 Generalized anxiety disorder: Secondary | ICD-10-CM

## 2017-07-12 DIAGNOSIS — D72829 Elevated white blood cell count, unspecified: Secondary | ICD-10-CM | POA: Diagnosis not present

## 2017-07-12 DIAGNOSIS — I10 Essential (primary) hypertension: Secondary | ICD-10-CM | POA: Diagnosis not present

## 2017-07-12 MED ORDER — METOPROLOL SUCCINATE ER 25 MG PO TB24: 25.0000 mg | ORAL_TABLET | Freq: Every day | ORAL | 1 refills | Status: DC

## 2017-07-12 MED ORDER — BUSPIRONE HCL 5 MG PO TABS: 5.0000 mg | ORAL_TABLET | Freq: Two times a day (BID) | ORAL | 1 refills | Status: DC

## 2017-07-12 MED ORDER — LISINOPRIL 10 MG PO TABS: 10.0000 mg | ORAL_TABLET | Freq: Every day | ORAL | 1 refills | Status: DC

## 2017-07-12 NOTE — Assessment & Plan Note (Signed)
Under good control. Will continue current regimen. Call with any concerns. Refills given.

## 2017-07-12 NOTE — Patient Instructions (Addendum)
Fat and Cholesterol Restricted Diet High levels of fat and cholesterol in your blood may lead to various health problems, such as diseases of the heart, blood vessels, gallbladder, liver, and pancreas. Fats are concentrated sources of energy that come in various forms. Certain types of fat, including saturated fat, may be harmful in excess. Cholesterol is a substance needed by your body in small amounts. Your body makes all the cholesterol it needs. Excess cholesterol comes from the food you eat. When you have high levels of cholesterol and saturated fat in your blood, health problems can develop because the excess fat and cholesterol will gather along the walls of your blood vessels, causing them to narrow. Choosing the right foods will help you control your intake of fat and cholesterol. This will help keep the levels of these substances in your blood within normal limits and reduce your risk of disease. What is my plan? Your health care provider recommends that you:  Limit your fat intake to ______% or less of your total calories per day.  Limit the amount of cholesterol in your diet to less than _________mg per day.  Eat 20-30 grams of fiber each day.  What types of fat should I choose?  Choose healthy fats more often. Choose monounsaturated and polyunsaturated fats, such as olive and canola oil, flaxseeds, walnuts, almonds, and seeds.  Eat more omega-3 fats. Good choices include salmon, mackerel, sardines, tuna, flaxseed oil, and ground flaxseeds. Aim to eat fish at least two times a week.  Limit saturated fats. Saturated fats are primarily found in animal products, such as meats, butter, and cream. Plant sources of saturated fats include palm oil, palm kernel oil, and coconut oil.  Avoid foods with partially hydrogenated oils in them. These contain trans fats. Examples of foods that contain trans fats are stick margarine, some tub margarines, cookies, crackers, and other baked goods. What  general guidelines do I need to follow? These guidelines for healthy eating will help you control your intake of fat and cholesterol:  Check food labels carefully to identify foods with trans fats or high amounts of saturated fat.  Fill one half of your plate with vegetables and green salads.  Fill one fourth of your plate with whole grains. Look for the word "whole" as the first word in the ingredient list.  Fill one fourth of your plate with lean protein foods.  Limit fruit to two servings a day. Choose fruit instead of juice.  Eat more foods that contain fiber, such as apples, broccoli, carrots, beans, peas, and barley.  Eat more home-cooked food and less restaurant, buffet, and fast food.  Limit or avoid alcohol.  Limit foods high in starch and sugar.  Limit fried foods.  Cook foods using methods other than frying. Baking, boiling, grilling, and broiling are all great options.  Lose weight if you are overweight. Losing just 5-10% of your initial body weight can help your overall health and prevent diseases such as diabetes and heart disease.  What foods can I eat? Grains  Whole grains, such as whole wheat or whole grain breads, crackers, cereals, and pasta. Unsweetened oatmeal, bulgur, barley, quinoa, or brown rice. Corn or whole wheat flour tortillas. Vegetables  Fresh or frozen vegetables (raw, steamed, roasted, or grilled). Green salads. Fruits  All fresh, canned (in natural juice), or frozen fruits. Meats and other protein foods  Ground beef (85% or leaner), grass-fed beef, or beef trimmed of fat. Skinless chicken or turkey. Ground chicken or turkey.   Pork trimmed of fat. All fish and seafood. Eggs. Dried beans, peas, or lentils. Unsalted nuts or seeds. Unsalted canned or dry beans. Dairy  Low-fat dairy products, such as skim or 1% milk, 2% or reduced-fat cheeses, low-fat ricotta or cottage cheese, or plain low-fat yo Fats and oils  Tub margarines without trans  fats. Light or reduced-fat mayonnaise and salad dressings. Avocado. Olive, canola, sesame, or safflower oils. Natural peanut or almond butter (choose ones without added sugar and oil). The items listed above may not be a complete list of recommended foods or beverages. Contact your dietitian for more options. Foods to avoid Grains  White bread. White pasta. White rice. Cornbread. Bagels, pastries, and croissants. Crackers that contain trans fat. Vegetables  White potatoes. Corn. Creamed or fried vegetables. Vegetables in a cheese sauce. Fruits  Dried fruits. Canned fruit in light or heavy syrup. Fruit juice. Meats and other protein foods  Fatty cuts of meat. Ribs, chicken wings, bacon, sausage, bologna, salami, chitterlings, fatback, hot dogs, bratwurst, and packaged luncheon meats. Liver and organ meats. Dairy  Whole or 2% milk, cream, half-and-half, and cream cheese. Whole milk cheeses. Whole-fat or sweetened yogurt. Full-fat cheeses. Nondairy creamers and whipped toppings. Processed cheese, cheese spreads, or cheese curds. Beverages  Alcohol. Sweetened drinks (such as sodas, lemonade, and fruit drinks or punches). Fats and oils  Butter, stick margarine, lard, shortening, ghee, or bacon fat. Coconut, palm kernel, or palm oils. Sweets and desserts  Corn syrup, sugars, honey, and molasses. Candy. Jam and jelly. Syrup. Sweetened cereals. Cookies, pies, cakes, donuts, muffins, and ice cream. The items listed above may not be a complete list of foods and beverages to avoid. Contact your dietitian for more information. This information is not intended to replace advice given to you by your health care provider. Make sure you discuss any questions you have with your health care provider. Document Released: 05/10/2005 Document Revised: 05/31/2014 Document Reviewed: 08/08/2013 Elsevier Interactive Patient Education  2018 [employer]  

## 2017-07-12 NOTE — Progress Notes (Signed)
BP 130/86 (BP Location: Left Arm, Patient Position: Sitting, Cuff Size: Large)   Pulse (!) 109   Wt 194 lb (88 kg)   SpO2 96%   BMI 35.48 kg/m    Subjective:    Patient ID: Lindsey Drake, female    DOB: 21-Jan-1966, 52 y.o.   MRN: [id number]  HPI: Lindsey Drake is a 52 y.o. female  Chief Complaint  Patient presents with  . Hypertension  . Depression   HYPERTENSION Hypertension status: better  Satisfied with current treatment? yes Duration of hypertension: chronic BP monitoring frequency:  daily BP medication side effects:  no Medication compliance: excellent compliance Previous BP meds: metoprolol, lisinopril Aspirin: no Recurrent headaches: no Visual changes: no Palpitations: no Dyspnea: no Chest pain: no Lower extremity edema: no Dizzy/lightheaded: no   ANXIETY/STRESS Duration:better Anxious mood: yes  Excessive worrying: yes Irritability: no  Sweating: no Nausea: no Palpitations:no Hyperventilation: no Panic attacks: no Agoraphobia: no  Obscessions/compulsions: no Depressed mood: yes Depression screen [facility] 2/9 06/09/2017 03/17/2017 05/20/2015 12/09/2014  Decreased Interest 3 0 3 3  Down, Depressed, Hopeless 0 0 2 1  PHQ - 2 Score 3 0 5 4  Altered sleeping 3 - 3 3  Tired, decreased energy 3 - 3 3  Change in appetite 3 - 3 2  Feeling bad or failure about yourself  0 - 0 0  Trouble concentrating 1 - 0 0  Moving slowly or fidgety/restless 0 - 0 0  Suicidal thoughts 0 - 0 0  PHQ-9 Score 13 - 14 12  Difficult doing work/chores Very difficult - Very difficult Very difficult   GAD 7 : Generalized Anxiety Score 07/12/2017 06/09/2017 11/03/2015 09/30/2015  Nervous, Anxious, on Edge 3 3 3 3   Control/stop worrying - 3 3 3   Worry too much - different things 3 3 3 3   Trouble relaxing 1 2 2 1   Restless 0 0 0 0  Easily annoyed or irritable 3 3 3 3   Afraid - awful might happen 0 0 0 0  Total GAD 7 Score - 14 14 13   Anxiety Difficulty Somewhat difficult Very  difficult Very difficult Somewhat difficult     Anhedonia: no Weight changes: no Insomnia: no   Hypersomnia: no Fatigue/loss of energy: yes Feelings of worthlessness: no Feelings of guilt: no Impaired concentration/indecisiveness: no Suicidal ideations: no  Crying spells: no Recent Stressors/Life Changes: yes   Relevant past medical, surgical, family and social history reviewed and updated as indicated. Interim medical history since our last visit reviewed. Allergies and medications reviewed and updated.  Review of Systems  Constitutional: Negative.   Respiratory: Negative.   Cardiovascular: Negative.   Gastrointestinal: Negative.   Psychiatric/Behavioral: Positive for dysphoric mood. Negative for agitation, behavioral problems, confusion, decreased concentration, hallucinations, self-injury, sleep disturbance and suicidal ideas. The patient is nervous/anxious. The patient is not hyperactive.     Per HPI unless specifically indicated above     Objective:    BP 130/86 (BP Location: Left Arm, Patient Position: Sitting, Cuff Size: Large)   Pulse (!) 109   Wt 194 lb (88 kg)   SpO2 96%   BMI 35.48 kg/m   Wt Readings from Last 3 Encounters:  07/12/17 194 lb (88 kg)  06/15/17 188 lb (85.3 kg)  06/09/17 192 lb 3 oz (87.2 kg)    Physical Exam  Constitutional: She is oriented to person, place, and time. She appears well-developed and well-nourished. No distress.  HENT:  Head: Normocephalic and atraumatic.  Right  Ear: Hearing normal.  Left Ear: Hearing normal.  Nose: Nose normal.  Eyes: Conjunctivae and lids are normal. Right eye exhibits no discharge. Left eye exhibits no discharge. No scleral icterus.  Cardiovascular: Normal rate, regular rhythm, normal heart sounds and intact distal pulses. Exam reveals no gallop and no friction rub.  No murmur heard. Pulmonary/Chest: Effort normal and breath sounds normal. No respiratory distress. She has no wheezes. She has no rales.  She exhibits no tenderness.  Musculoskeletal: Normal range of motion.  Neurological: She is alert and oriented to person, place, and time.  Skin: Skin is warm, dry and intact. No rash noted. She is not diaphoretic. No erythema. No pallor.  Psychiatric: She has a normal mood and affect. Her speech is normal and behavior is normal. Judgment and thought content normal. Cognition and memory are normal.  Nursing note and vitals reviewed.   Results for orders placed or performed in visit on 06/09/17  Microscopic Examination  Result Value Ref Range   WBC, UA 6-10 (A) 0 - 5 /hpf   RBC, UA 3-10 (A) 0 - 2 /hpf   Epithelial Cells (non renal) 0-10 0 - 10 /hpf   Renal Epithel, UA 0-10 (A) None seen /hpf   Bacteria, UA Few None seen/Few  Urine Culture, Reflex  Result Value Ref Range   Urine Culture, Routine Final report (A)    Organism ID, Bacteria Escherichia coli (A)    Antimicrobial Susceptibility Comment   CBC with Differential/Platelet  Result Value Ref Range   WBC 12.8 (H) 3.4 - 10.8 x10E3/uL   RBC 4.25 3.77 - 5.28 x10E6/uL   Hemoglobin 12.8 11.1 - 15.9 g/dL   Hematocrit 19.1 47.8 - 46.6 %   MCV 88 79 - 97 fL   MCH 30.1 26.6 - 33.0 pg   MCHC 34.4 31.5 - 35.7 g/dL   RDW 29.5 62.1 - 30.8 %   Platelets 336 150 - 379 x10E3/uL   Neutrophils 58 Not Estab. %   Lymphs 35 Not Estab. %   Monocytes 6 Not Estab. %   Eos 1 Not Estab. %   Basos 0 Not Estab. %   Neutrophils Absolute 7.4 (H) 1.4 - 7.0 x10E3/uL   Lymphocytes Absolute 4.5 (H) 0.7 - 3.1 x10E3/uL   Monocytes Absolute 0.8 0.1 - 0.9 x10E3/uL   EOS (ABSOLUTE) 0.2 0.0 - 0.4 x10E3/uL   Basophils Absolute 0.0 0.0 - 0.2 x10E3/uL   Immature Granulocytes 0 Not Estab. %   Immature Grans (Abs) 0.0 0.0 - 0.1 x10E3/uL  Comprehensive metabolic panel  Result Value Ref Range   Glucose 85 65 - 99 mg/dL   BUN 15 6 - 24 mg/dL   Creatinine, Ser 6.57 0.57 - 1.00 mg/dL   GFR calc non Af Amer 101 >59 mL/min/1.73   GFR calc Af Amer 116 >59 mL/min/1.73    BUN/Creatinine Ratio 22 9 - 23   Sodium 141 134 - 144 mmol/L   Potassium 3.7 3.5 - 5.2 mmol/L   Chloride 103 96 - 106 mmol/L   CO2 21 20 - 29 mmol/L   Calcium 9.0 8.7 - 10.2 mg/dL   Total Protein 6.6 6.0 - 8.5 g/dL   Albumin 4.2 3.5 - 5.5 g/dL   Globulin, Total 2.4 1.5 - 4.5 g/dL   Albumin/Globulin Ratio 1.8 1.2 - 2.2   Bilirubin Total 0.2 0.0 - 1.2 mg/dL   Alkaline Phosphatase 68 39 - 117 IU/L   AST 25 0 - 40 IU/L   ALT 22 0 -  32 IU/L  Lipid Panel w/o Chol/HDL Ratio  Result Value Ref Range   Cholesterol, Total 261 (H) 100 - 199 mg/dL   Triglycerides 161226 (H) 0 - 149 mg/dL   HDL 50 >09>39 mg/dL   VLDL Cholesterol Cal 45 (H) 5 - 40 mg/dL   LDL Calculated 604166 (H) 0 - 99 mg/dL  Microalbumin, Urine Waived  Result Value Ref Range   Microalb, Ur Waived 80 (H) 0 - 19 mg/L   Creatinine, Urine Waived 100 10 - 300 mg/dL   Microalb/Creat Ratio 30-300 (H) <30 mg/g  TSH  Result Value Ref Range   TSH 1.820 0.450 - 4.500 uIU/mL  UA/M w/rflx Culture, Routine  Result Value Ref Range   Specific Gravity, UA 1.015 1.005 - 1.030   pH, UA 5.5 5.0 - 7.5   Color, UA Straw Yellow   Appearance Ur Cloudy (A) Clear   Leukocytes, UA 2+ (A) Negative   Protein, UA Negative Negative/Trace   Glucose, UA Negative Negative   Ketones, UA Negative Negative   RBC, UA 2+ (A) Negative   Bilirubin, UA Negative Negative   Urobilinogen, Ur 0.2 0.2 - 1.0 mg/dL   Nitrite, UA Negative Negative   Microscopic Examination See below:    Urinalysis Reflex Comment       Assessment & Plan:   Problem List Items Addressed This Visit      Cardiovascular and Mediastinum   Essential hypertension, benign - Primary    Under good control. Will continue current regimen. Call with any concerns. Refills given.       Relevant Medications   metoprolol succinate (TOPROL-XL) 25 MG 24 hr tablet   lisinopril (PRINIVIL,ZESTRIL) 10 MG tablet   Other Relevant Orders   Basic metabolic panel     Other   Anxiety disorder     Slightly better. Still quite anxious. Will have her continue her buspar. Call with any concerns. Continue to monitor.       Relevant Medications   busPIRone (BUSPAR) 5 MG tablet   Elevated WBC count    Feeling well today. Rechecking levels today. If still high- will get her into hematology for evaluation.       Relevant Orders   CBC with Differential/Platelet       Follow up plan: Return in about 6 months (around 01/09/2018) for Follow up.

## 2017-07-12 NOTE — Assessment & Plan Note (Signed)
Slightly better. Still quite anxious. Will have her continue her buspar. Call with any concerns. Continue to monitor.

## 2017-07-12 NOTE — Assessment & Plan Note (Signed)
Feeling well today. Rechecking levels today. If still high- will get her into hematology for evaluation.

## 2017-07-13 ENCOUNTER — Encounter: Payer: Self-pay | Admitting: Family Medicine

## 2017-07-13 LAB — CBC WITH DIFFERENTIAL/PLATELET
Basophils Absolute: 0 10*3/uL (ref 0.0–0.2)
Basos: 0 %
EOS (ABSOLUTE): 0.1 10*3/uL (ref 0.0–0.4)
Eos: 1 %
Hematocrit: 35.3 % (ref 34.0–46.6)
Hemoglobin: 12.2 g/dL (ref 11.1–15.9)
Immature Grans (Abs): 0 10*3/uL (ref 0.0–0.1)
Immature Granulocytes: 0 %
Lymphocytes Absolute: 3.1 10*3/uL (ref 0.7–3.1)
Lymphs: 30 %
MCH: 30.4 pg (ref 26.6–33.0)
MCHC: 34.6 g/dL (ref 31.5–35.7)
MCV: 88 fL (ref 79–97)
Monocytes Absolute: 0.6 10*3/uL (ref 0.1–0.9)
Monocytes: 6 %
Neutrophils Absolute: 6.4 10*3/uL (ref 1.4–7.0)
Neutrophils: 63 %
Platelets: 306 10*3/uL (ref 150–379)
RBC: 4.01 x10E6/uL (ref 3.77–5.28)
RDW: 13.6 % (ref 12.3–15.4)
WBC: 10.2 10*3/uL (ref 3.4–10.8)

## 2017-07-13 LAB — BASIC METABOLIC PANEL
BUN/Creatinine Ratio: 15 (ref 9–23)
BUN: 12 mg/dL (ref 6–24)
CO2: 23 mmol/L (ref 20–29)
Calcium: 9.2 mg/dL (ref 8.7–10.2)
Chloride: 104 mmol/L (ref 96–106)
Creatinine, Ser: 0.81 mg/dL (ref 0.57–1.00)
GFR calc Af Amer: 97 mL/min/{1.73_m2} (ref >59)
GFR calc non Af Amer: 84 mL/min/{1.73_m2} (ref >59)
Glucose: 103 mg/dL — ABNORMAL HIGH (ref 65–99)
Potassium: 3.7 mmol/L (ref 3.5–5.2)
Sodium: 142 mmol/L (ref 134–144)

## 2017-09-06 ENCOUNTER — Ambulatory Visit (HOSPITAL_BASED_OUTPATIENT_CLINIC_OR_DEPARTMENT_OTHER): Payer: BLUE CROSS/BLUE SHIELD | Admitting: Pain Medicine

## 2017-09-06 ENCOUNTER — Encounter: Payer: Self-pay | Admitting: Pain Medicine

## 2017-09-06 ENCOUNTER — Ambulatory Visit
Admission: RE | Admit: 2017-09-06 | Discharge: 2017-09-06 | Disposition: A | Discharge status: Home / Self Care | Payer: BLUE CROSS/BLUE SHIELD | Source: Ambulatory Visit | Attending: Pain Medicine | Admitting: Pain Medicine

## 2017-09-06 ENCOUNTER — Other Ambulatory Visit: Payer: Self-pay

## 2017-09-06 VITALS — BP 127/83 | HR 90 | Temp 98.2°F | Resp 16 | Ht 62.0 in | Wt 185.0 lb

## 2017-09-06 DIAGNOSIS — M4724 Other spondylosis with radiculopathy, thoracic region: Secondary | ICD-10-CM

## 2017-09-06 DIAGNOSIS — M5414 Radiculopathy, thoracic region: Secondary | ICD-10-CM

## 2017-09-06 DIAGNOSIS — M47814 Spondylosis without myelopathy or radiculopathy, thoracic region: Secondary | ICD-10-CM | POA: Diagnosis present

## 2017-09-06 DIAGNOSIS — M5134 Other intervertebral disc degeneration, thoracic region: Secondary | ICD-10-CM | POA: Insufficient documentation

## 2017-09-06 DIAGNOSIS — M5124 Other intervertebral disc displacement, thoracic region: Secondary | ICD-10-CM | POA: Diagnosis not present

## 2017-09-06 MED ORDER — FENTANYL CITRATE (PF) 100 MCG/2ML IJ SOLN
25.0000 ug | INTRAMUSCULAR | Status: DC | PRN
  Administered 2017-09-06: 50 ug via INTRAVENOUS
  Filled 2017-09-06: qty 2

## 2017-09-06 MED ORDER — LIDOCAINE HCL 2 % IJ SOLN
20.0000 mL | Freq: Once | INTRAMUSCULAR | Status: AC
  Administered 2017-09-06: 400 mg
  Filled 2017-09-06: qty 40

## 2017-09-06 MED ORDER — LACTATED RINGERS IV SOLN
1000.0000 mL | Freq: Once | INTRAVENOUS | Status: AC
  Administered 2017-09-06: 1000 mL via INTRAVENOUS

## 2017-09-06 MED ORDER — SODIUM CHLORIDE 0.9 % IJ SOLN
INTRAMUSCULAR | Status: AC
  Filled 2017-09-06: qty 10

## 2017-09-06 MED ORDER — SODIUM CHLORIDE 0.9% FLUSH
2.0000 mL | Freq: Once | INTRAVENOUS | Status: AC
  Administered 2017-09-06: 10 mL

## 2017-09-06 MED ORDER — ROPIVACAINE HCL 2 MG/ML IJ SOLN
2.0000 mL | Freq: Once | INTRAMUSCULAR | Status: AC
  Administered 2017-09-06: 10 mL via EPIDURAL
  Filled 2017-09-06: qty 10

## 2017-09-06 MED ORDER — MIDAZOLAM HCL 5 MG/5ML IJ SOLN
1.0000 mg | INTRAMUSCULAR | Status: DC | PRN
  Administered 2017-09-06: 2 mg via INTRAVENOUS
  Filled 2017-09-06: qty 5

## 2017-09-06 MED ORDER — DEXAMETHASONE SODIUM PHOSPHATE 10 MG/ML IJ SOLN
10.0000 mg | Freq: Once | INTRAMUSCULAR | Status: AC
  Administered 2017-09-06: 10 mg
  Filled 2017-09-06: qty 1

## 2017-09-06 MED ORDER — IOPAMIDOL (ISOVUE-M 200) INJECTION 41%
10.0000 mL | Freq: Once | INTRAMUSCULAR | Status: AC
  Administered 2017-09-06: 10 mL via EPIDURAL
  Filled 2017-09-06: qty 10

## 2017-09-06 NOTE — Progress Notes (Signed)
Patient's Name: Lindsey Drake  MRN: [id number]  Referring Provider: Dorcas CarrowJohnson, Megan P, DO  DOB: 10/09/1965  PCP: Dorcas CarrowJohnson, Megan P, DO  DOS: 09/06/2017  Note by: Oswaldo DoneFrancisco A Reya Aurich, MD  Service setting: Ambulatory outpatient  Specialty: Interventional Pain Management  Patient type: Established  Location: [facility] (AMB) Pain Management Facility  Visit type: Interventional Procedure   Primary Reason for Visit: Interventional Pain Management Treatment. CC: Abdominal Pain (right)  Procedure:  Anesthesia, Analgesia, Anxiolysis:  Type: Diagnostic Inter-Laminar Thoracic Epidural Block          Region: Posterior Thoracolumbar Level: T6-7 Laterality: Right Paraspinal  Type: Moderate (Conscious) Sedation combined with Local Anesthesia Indication(s): Analgesia and Anxiety Route: Intravenous (IV) IV Access: Secured Sedation: Meaningful verbal contact was maintained at all times during the procedure  Local Anesthetic: Lidocaine 1-2%   Indications: 1. DDD (degenerative disc disease), thoracic   2. Thoracic spondylosis with radiculopathy   3. 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 Chronic thoracic radiculopathy (Right)   5. Thoracic spondylosis    Pain Score: Pre-procedure: 6 /10 Post-procedure: 0-No pain/10  Pre-op Assessment:  Lindsey Drake is a 52 y.o. (year old), female patient, seen today for interventional treatment. She  has a past surgical history that includes Partial hysterectomy (2004); Femur fracture surgery (Left, 1983); Bilateral carpal tunnel release (1994); Colonoscopy (05/06/2015); Abdominoplasty (2007); Spine surgery (2001); Upper gi endoscopy (01/14/2015); Breast enhancement surgery (Bilateral); and Augmentation mammaplasty (Bilateral, 2008). Lindsey Drake has a current medication list which includes the following prescription(s): buspirone, fluticasone, glucosamine-chondroitin, ibuprofen, lisinopril, metoprolol succinate, multivitamin, and gabapentin, and the following Facility-Administered  Medications: fentanyl, lactated ringers, and midazolam. Her primarily concern today is the Abdominal Pain (right)  Initial Vital Signs:  Pulse Rate: 90 Temp: 98.2 F (36.8 C) Resp: 14 BP: (!) 143/93 SpO2: 98 %  BMI: Estimated body mass index is 33.84 kg/m as calculated from the following:   Height as of this encounter: 5\' 2"  (1.575 m).   Weight as of this encounter: 185 lb (83.9 kg).  Risk Assessment: Allergies: Reviewed. She has No Known Allergies.  Allergy Precautions: None required Coagulopathies: Reviewed. None identified.  Blood-thinner therapy: None at this time Active Infection(s): Reviewed. None identified. Lindsey Drake is afebrile  Site Confirmation: Lindsey Drake was asked to confirm the procedure and laterality before marking the site Procedure checklist: Completed Consent: Before the procedure and under the influence of no sedative(s), amnesic(s), or anxiolytics, the patient was informed of the treatment options, risks and possible complications. To fulfill our ethical and legal obligations, as recommended by the American Medical Association's Code of Ethics, I have informed the patient of my clinical impression; the nature and purpose of the treatment or procedure; the risks, benefits, and possible complications of the intervention; the alternatives, including doing nothing; the risk(s) and benefit(s) of the alternative treatment(s) or procedure(s); and the risk(s) and benefit(s) of doing nothing. The patient was provided information about the general risks and possible complications associated with the procedure. These may include, but are not limited to: failure to achieve desired goals, infection, bleeding, organ or nerve damage, allergic reactions, paralysis, and death. In addition, the patient was informed of those risks and complications associated to Spine-related procedures, such as failure to decrease pain; infection (i.e.: Meningitis, epidural or intraspinal abscess);  bleeding (i.e.: epidural hematoma, subarachnoid hemorrhage, or any other type of intraspinal or peri-dural bleeding); organ or nerve damage (i.e.: Any type of peripheral nerve, nerve root, or spinal cord injury) with subsequent damage to sensory, motor,  and/or autonomic systems, resulting in permanent pain, numbness, and/or weakness of one or several areas of the body; allergic reactions; (i.e.: anaphylactic reaction); and/or death. Furthermore, the patient was informed of those risks and complications associated with the medications. These include, but are not limited to: allergic reactions (i.e.: anaphylactic or anaphylactoid reaction(s)); adrenal axis suppression; blood sugar elevation that in diabetics may result in ketoacidosis or comma; water retention that in patients with history of congestive heart failure may result in shortness of breath, pulmonary edema, and decompensation with resultant heart failure; weight gain; swelling or edema; medication-induced neural toxicity; particulate matter embolism and blood vessel occlusion with resultant organ, and/or nervous system infarction; and/or aseptic necrosis of one or more joints. Finally, the patient was informed that Medicine is not an exact science; therefore, there is also the possibility of unforeseen or unpredictable risks and/or possible complications that may result in a catastrophic outcome. The patient indicated having understood very clearly. We have given the patient no guarantees and we have made no promises. Enough time was given to the patient to ask questions, all of which were answered to the patient's satisfaction. Lindsey Drake has indicated that she wanted to continue with the procedure. Attestation: I, the ordering provider, attest that I have discussed with the patient the benefits, risks, side-effects, alternatives, likelihood of achieving goals, and potential problems during recovery for the procedure that I have provided informed  consent. Date  Time: 09/06/2017  1:30 PM  Pre-Procedure Preparation:  Monitoring: As per clinic protocol. Respiration, ETCO2, SpO2, BP, heart rate and rhythm monitor placed and checked for adequate function Safety Precautions: Patient was assessed for positional comfort and pressure points before starting the procedure. Time-out: I initiated and conducted the "Time-out" before starting the procedure, as per protocol. The patient was asked to participate by confirming the accuracy of the "Time Out" information. Verification of the correct person, site, and procedure were performed and confirmed by me, the nursing staff, and the patient. "Time-out" conducted as per Joint Commission's Universal Protocol (UP.01.01.01). Time: 1431  Description of Procedure Process:  Position: Prone Target Area: For Epidural Steroid injection(s), the target area is the  interlaminar space, initially targeting the lower border of the superior vertebral body lamina. Approach: Interlaminar approach. Area Prepped: Entire Posterior Thoracolumbar Region Prepping solution: ChloraPrep (2% chlorhexidine gluconate and 70% isopropyl alcohol) Safety Precautions: Aspiration looking for blood return was conducted prior to all injections. At no point did we inject any substances, as a needle was being advanced. No attempts were made at seeking any paresthesias. Safe injection practices and needle disposal techniques used. Medications properly checked for expiration dates. SDV (single dose vial) medications used. Description of the Procedure: Protocol guidelines were followed. The patient was placed in position over the fluoroscopy table. The target area was identified and the area prepped in the usual manner. Skin & deeper tissues infiltrated with local anesthetic. Appropriate amount of time allowed to pass for local anesthetics to take effect. The procedure needles were then advanced to the target area. The inferior aspect of the  superior lamina was contacted and the needle walked caudad, until the lamina was cleared. The epidural space was identified using "loss-of-resistance technique" with 0.9% PF-NSS (2-26mL), in a low friction 10cc LOR glass syringe. Proper needle placement was secured. Negative aspiration confirmed. Solution injected in intermittent fashion, asking for systemic symptoms every 0.5 cc of injectate. The needles were then removed and the area cleansed, making sure to leave some of the prepping solution behind  to take advantage of its long term bactericidal properties. Vitals:   09/06/17 1433 09/06/17 1438 09/06/17 1441 09/06/17 1448  BP: (!) 125/91 (!) 123/95 (!) 128/94 128/88  Pulse:      Resp: 12 17 18 16   Temp:      TempSrc:      SpO2: 98% 94% 96% 98%  Weight:      Height:        Start Time: 1431 hrs. End Time: 1441 hrs. Materials:  Needle(s) Type: Epidural needle Gauge: 17G Length: 3.5-in Medication(s): Please see orders for medications and dosing details.  Imaging Guidance (Spinal):  Type of Imaging Technique: Fluoroscopy Guidance (Spinal) Indication(s): Assistance in needle guidance and placement for procedures requiring needle placement in or near specific anatomical locations not easily accessible without such assistance. Exposure Time: Please see nurses notes. Contrast: Before injecting any contrast, we confirmed that the patient did not have an allergy to iodine, shellfish, or radiological contrast. Once satisfactory needle placement was completed at the desired level, radiological contrast was injected. Contrast injected under live fluoroscopy. No contrast complications. See chart for type and volume of contrast used. Fluoroscopic Guidance: I was personally present during the use of fluoroscopy. "Tunnel Vision Technique" used to obtain the best possible view of the target area. Parallax error corrected before commencing the procedure. "Direction-depth-direction" technique used to  introduce the needle under continuous pulsed fluoroscopy. Once target was reached, antero-posterior, oblique, and lateral fluoroscopic projection used confirm needle placement in all planes. Images permanently stored in EMR. Interpretation: I personally interpreted the imaging intraoperatively. Adequate needle placement confirmed in multiple planes. Appropriate spread of contrast into desired area was observed. No evidence of afferent or efferent intravascular uptake. No intrathecal or subarachnoid spread observed. Permanent images saved into the patient's record.  Antibiotic Prophylaxis:   Anti-infectives (From admission, onward)   None     Indication(s): None identified  Post-operative Assessment:  Post-procedure Vital Signs:  Pulse Rate: 90 Temp: 98.2 F (36.8 C) Resp: 16 BP: 128/88 SpO2: 98 %  EBL: None  Complications: No immediate post-treatment complications observed by team, or reported by patient.  Note: The patient tolerated the entire procedure well. A repeat set of vitals were taken after the procedure and the patient was kept under observation following institutional policy, for this type of procedure. Post-procedural neurological assessment was performed, showing return to baseline, prior to discharge. The patient was provided with post-procedure discharge instructions, including a section on how to identify potential problems. Should any problems arise concerning this procedure, the patient was given instructions to immediately contact us, at any time, without hesitation. In any case, we plan to contact the patient by telephone for a follow-up status report regarding this interventional procedure.  Comments:  No additional relevant information.  Plan of Care    Imaging Orders     DG C-Arm 1-60 Min-No Report  Procedure Orders     Thoracic Epidural Injection  Medications ordered for procedure: Meds ordered this encounter  Medications  . iopamidol (ISOVUE-M) 41 %  intrathecal injection 10 mL    Must be Myelogram-compatible. If not available, you may substitute with a water-soluble, non-ionic, hypoallergenic, myelogram-compatible radiological contrast medium.  Marland Kitchen lidocaine (XYLOCAINE) 2 % (with pres) injection 400 mg  . midazolam (VERSED) 5 MG/5ML injection 1-2 mg    Make sure Flumazenil is available in the pyxis when using this medication. If oversedation occurs, administer 0.2 mg IV over 15 sec. If after 45 sec no response, administer 0.2 mg again  over 1 min; may repeat at 1 min intervals; not to exceed 4 doses (1 mg)  . fentaNYL (SUBLIMAZE) injection 25-50 mcg    Make sure Narcan is available in the pyxis when using this medication. In the event of respiratory depression (RR< 8/min): Titrate NARCAN (naloxone) in increments of 0.1 to 0.2 mg IV at 2-3 minute intervals, until desired degree of reversal.  . lactated ringers infusion 1,000 mL  . sodium chloride flush (NS) 0.9 % injection 2 mL  . ropivacaine (PF) 2 mg/mL (0.2%) (NAROPIN) injection 2 mL  . dexamethasone (DECADRON) injection 10 mg   Medications administered: We administered iopamidol, lidocaine, midazolam, fentaNYL, lactated ringers, sodium chloride flush, ropivacaine (PF) 2 mg/mL (0.2%), and dexamethasone.  See the medical record for exact dosing, route, and time of administration.  New Prescriptions   No medications on file   Disposition: Discharge home  Discharge Date & Time: 09/06/2017; 1510 hrs.   Physician-requested Follow-up: Return for post-procedure eval (2 wks), w/ Dr. Laban Emperor.  Future Appointments  Date Time Provider Department Center  10/03/2017  1:30 PM Delano Metz, MD [facility] None  01/09/2018  3:30 PM Dorcas Carrow, DO [facility]   Primary Care Physician: Dorcas Carrow, DO Location: [facility] Note by: Oswaldo Done, MD Date: 09/06/2017; Time: 3:02 PM  Disclaimer:  Medicine is not an Visual merchandiser. The only guarantee in  medicine is that nothing is guaranteed. It is important to note that the decision to proceed with this intervention was based on the information collected from the patient. The Data and conclusions were drawn from the patient's questionnaire, the interview, and the physical examination. Because the information was provided in large part by the patient, it cannot be guaranteed that it has not been purposely or unconsciously manipulated. Every effort has been made to obtain as much relevant data as possible for this evaluation. It is important to note that the conclusions that lead to this procedure are derived in large part from the available data. Always take into account that the treatment will also be dependent on availability of resources and existing treatment guidelines, considered by other Pain Management Practitioners as being common knowledge and practice, at the time of the intervention. For Medico-Legal purposes, it is also important to point out that variation in procedural techniques and pharmacological choices are the acceptable norm. The indications, contraindications, technique, and results of the above procedure should only be interpreted and judged by a Board-Certified Interventional Pain Specialist with extensive familiarity and expertise in the same exact procedure and technique.

## 2017-09-06 NOTE — Progress Notes (Signed)
Safety precautions to be maintained throughout the outpatient stay will include: orient to surroundings, keep bed in low position, maintain call bell within reach at all times, provide assistance with transfer out of bed and ambulation.  

## 2017-09-06 NOTE — Patient Instructions (Addendum)

## 2017-09-07 ENCOUNTER — Telehealth: Payer: Self-pay | Admitting: *Deleted

## 2017-09-07 NOTE — Telephone Encounter (Signed)
LVM for patient to call for any post- procedure concerns 

## 2017-10-03 ENCOUNTER — Ambulatory Visit: Payer: BLUE CROSS/BLUE SHIELD | Attending: Pain Medicine | Admitting: Pain Medicine

## 2017-10-03 ENCOUNTER — Other Ambulatory Visit: Payer: Self-pay

## 2017-10-03 ENCOUNTER — Encounter: Payer: Self-pay | Admitting: Pain Medicine

## 2017-10-03 VITALS — BP 135/96 | HR 101 | Temp 98.1°F | Resp 16 | Ht 62.0 in | Wt 180.0 lb

## 2017-10-03 DIAGNOSIS — K219 Gastro-esophageal reflux disease without esophagitis: Secondary | ICD-10-CM | POA: Diagnosis not present

## 2017-10-03 DIAGNOSIS — M5116 Intervertebral disc disorders with radiculopathy, lumbar region: Secondary | ICD-10-CM | POA: Insufficient documentation

## 2017-10-03 DIAGNOSIS — M5124 Other intervertebral disc displacement, thoracic region: Secondary | ICD-10-CM

## 2017-10-03 DIAGNOSIS — Z7951 Long term (current) use of inhaled steroids: Secondary | ICD-10-CM | POA: Diagnosis not present

## 2017-10-03 DIAGNOSIS — Z833 Family history of diabetes mellitus: Secondary | ICD-10-CM | POA: Diagnosis not present

## 2017-10-03 DIAGNOSIS — M7918 Myalgia, other site: Secondary | ICD-10-CM | POA: Diagnosis not present

## 2017-10-03 DIAGNOSIS — M25552 Pain in left hip: Secondary | ICD-10-CM | POA: Diagnosis not present

## 2017-10-03 DIAGNOSIS — M4724 Other spondylosis with radiculopathy, thoracic region: Secondary | ICD-10-CM | POA: Insufficient documentation

## 2017-10-03 DIAGNOSIS — G4700 Insomnia, unspecified: Secondary | ICD-10-CM | POA: Diagnosis not present

## 2017-10-03 DIAGNOSIS — Z981 Arthrodesis status: Secondary | ICD-10-CM | POA: Diagnosis not present

## 2017-10-03 DIAGNOSIS — G894 Chronic pain syndrome: Secondary | ICD-10-CM | POA: Insufficient documentation

## 2017-10-03 DIAGNOSIS — F419 Anxiety disorder, unspecified: Secondary | ICD-10-CM | POA: Diagnosis not present

## 2017-10-03 DIAGNOSIS — Z808 Family history of malignant neoplasm of other organs or systems: Secondary | ICD-10-CM | POA: Insufficient documentation

## 2017-10-03 DIAGNOSIS — I10 Essential (primary) hypertension: Secondary | ICD-10-CM | POA: Diagnosis not present

## 2017-10-03 DIAGNOSIS — M5134 Other intervertebral disc degeneration, thoracic region: Secondary | ICD-10-CM

## 2017-10-03 DIAGNOSIS — M5414 Radiculopathy, thoracic region: Secondary | ICD-10-CM | POA: Diagnosis not present

## 2017-10-03 DIAGNOSIS — Z87440 Personal history of urinary (tract) infections: Secondary | ICD-10-CM | POA: Insufficient documentation

## 2017-10-03 DIAGNOSIS — Z79899 Other long term (current) drug therapy: Secondary | ICD-10-CM | POA: Insufficient documentation

## 2017-10-03 DIAGNOSIS — M792 Neuralgia and neuritis, unspecified: Secondary | ICD-10-CM

## 2017-10-03 DIAGNOSIS — M5114 Intervertebral disc disorders with radiculopathy, thoracic region: Secondary | ICD-10-CM | POA: Diagnosis not present

## 2017-10-03 DIAGNOSIS — M533 Sacrococcygeal disorders, not elsewhere classified: Secondary | ICD-10-CM | POA: Insufficient documentation

## 2017-10-03 DIAGNOSIS — M47814 Spondylosis without myelopathy or radiculopathy, thoracic region: Secondary | ICD-10-CM

## 2017-10-03 DIAGNOSIS — R109 Unspecified abdominal pain: Secondary | ICD-10-CM | POA: Diagnosis present

## 2017-10-03 MED ORDER — GABAPENTIN 100 MG PO CAPS
100.0000 mg | ORAL_CAPSULE | Freq: Four times a day (QID) | ORAL | 0 refills | Status: DC
Start: 2017-10-03 — End: 2018-02-14

## 2017-10-03 NOTE — Progress Notes (Signed)
Safety precautions to be maintained throughout the outpatient stay will include: orient to surroundings, keep bed in low position, maintain call bell within reach at all times, provide assistance with transfer out of bed and ambulation.  

## 2017-10-03 NOTE — Patient Instructions (Addendum)
____________________________________________________________________________________________  Preparing for Procedure with Sedation  Instructions: . Oral Intake: Do not eat or drink anything for at least 8 hours prior to your procedure. . Transportation: Public transportation is not allowed. Bring an adult driver. The driver must be physically present in our waiting room before any procedure can be started. Marland Kitchen Physical Assistance: Bring an adult physically capable of assisting you, in the event you need help. This adult should keep you company at home for at least 6 hours after the procedure. . Blood Pressure Medicine: Take your blood pressure medicine with a sip of water the morning of the procedure. . Blood thinners:  . Diabetics on insulin: Notify the staff so that you can be scheduled 1st case in the morning. If your diabetes requires high dose insulin, take only  of your normal insulin dose the morning of the procedure and notify the staff that you have done so. . Preventing infections: Shower with an antibacterial soap the morning of your procedure. . Build-up your immune system: Take 1000 mg of Vitamin C with every meal (3 times a day) the day prior to your procedure. Marland Kitchen Antibiotics: Inform the staff if you have a condition or reason that requires you to take antibiotics before dental procedures. . Pregnancy: If you are pregnant, call and cancel the procedure. . Sickness: If you have a cold, fever, or any active infections, call and cancel the procedure. . Arrival: You must be in the facility at least 30 minutes prior to your scheduled procedure. . Children: Do not bring children with you. . Dress appropriately: Bring dark clothing that you would not mind if they get stained. . Valuables: Do not bring any jewelry or valuables.  Procedure appointments are reserved for interventional treatments only. Marland Kitchen No Prescription Refills. . No medication changes will be discussed during procedure  appointments. . No disability issues will be discussed.  Remember:  Regular Business hours are:  Monday to Thursday 8:00 AM to 4:00 PM  Provider's Schedule: Delano Metz, MD:  Procedure days: Tuesday and Thursday 7:30 AM to 4:00 PM  Edward Jolly, MD:  Procedure days: Monday and Wednesday 7:30 AM to 4:00 PM ____________________________________________________________________________________________   ____________________________________________________________________________________________  Gabapentin Titration  Medication used: Gabapentin (Generic Name) or Neurontin (Brand Name) 100 mg tablets/capsules  Reasons to stop increasing the dose:  Reason 1: You get good relief of symptoms, in which case there is no need to increase the daily dose any further.    Reason 2: You develop some side effects, such as sleeping all of the time, difficulty concentrating, or becoming disoriented, in which case you need to go down on the dose, to the prior level, where you were not experiencing any side effects. Stay on that dose longer, to allow more time for your body to get use it, before attempting to increase it again.   Steps to increase medication: Step 1: Start by taking 1 (one) tablet at bedtime x 7 (seven) days.  Step 2: Increase dose to 2 (two) tablets at bedtime. Stay on this dose x 7 (seven) days.  Step 3: Next increase it to 3 (three) tablets at bedtime. Stay on this dose x another 7 (seven) days.  Step 4: Next, add 1 (one) tablet at noon with lunch. Continue this dose x another 7 (seven) days.  Step 5: Add 1 (one) tablet in the afternoon with dinner. Stay on this dose x another 7 (seven) days.  Step 6: At this point you should be taking the  medicine 4 (four) times a day. This daily regimen of taking the medicine 4 (four) times a day, will be maintained from now on. You should not take any doses any sooner than every 6 (six) hours.  Step 7: After 7 (seven) days of taking 3  (three) tablet at bedtime, 1 (one) tablet at noon, 1 (one) tablet in the afternoon, and 1 (one) tablet in the morning, begin taking 2 (two) tablets at noon with lunch. Stay on this dose x another 7 (seven) days.   Step 8: After 7 (seven) days of taking 3 (three) tablet at bedtime, 2 (two) tablets at noon, 1 (one) tablet in the afternoon, and 1 (one) tablet in the morning, begin taking 2 (two) tablets in the afternoon with dinner. Stay on this dose x another 7 (seven) days.   Step 9: After 7 (seven) days of taking 3 (three) tablet at bedtime, 2 (two) tablets at noon, 2 (two) tablets in the afternoon, and 1 (one) tablet in the morning, begin taking 2 (two) tablets in the morning with breakfast. Stay on this dose x another 7 (seven) days. At this point you should be taking the medicine 4 (four) times a day, or about every 6 (six) hours. This daily regimen of taking the medicine 4 (four) times a day, will be maintained from now on. You should not take any doses any sooner than every 6 (six) hours.  Step 10: After 7 (seven) days of taking 3 (three) tablet at bedtime, 2 (two) tablets at noon, 2 (two) tablets in the afternoon, and 2 (two) tablets in the morning, begin taking 3 (three) tablets at noon with lunch. Stay on this dose x another 7 (seven) days.   Step 11: After 7 (seven) days of taking 3 (three) tablet at bedtime, 3 (three) tablets at noon, 2 (two) tablets in the afternoon, and 2 (two) tablets in the morning, begin taking 3 (three) tablets in the afternoon with dinner. Stay on this dose x another 7 (seven) days.   Step 12: After 7 (seven) days of taking 3 (three) tablet at bedtime, 3 (three) tablets at noon, 3 (three) tablets in the afternoon, and 2 (two) tablet in the morning, begin taking 3 (three) tablets in the morning with breakfast. Stay on this dose x another 7 (seven) days. At this point you should be taking the medicine 4 (four) times a day, or about every 6 (six) hours. This daily regimen of  taking the medicine 4 (four) times a day, will be maintained from now on.   Endpoint: Once you have reached the maximum dose you can tolerate without side-effects, contact your physician so as to evaluate the results of the regimen.   Questions: Feel free to contact us for any questions or problems at [phone] ____________________________________________________________________________________________ Pain Management Discharge Instructions  General Discharge Instructions :  If you need to reach your doctor call: Monday-Friday 8:00 am - 4:00 pm at [phone] or toll free [phone].  After clinic hours [phone] to have operator reach doctor.  Bring all of your medication bottles to all your appointments in the pain clinic.  To cancel or reschedule your appointment with Pain Management please remember to call 24 hours in advance to avoid a fee.  Refer to the educational materials which you have been given on: General Risks, I had my Procedure. Discharge Instructions, Post Sedation.  Post Procedure Instructions:  The drugs you were given will stay in your system until tomorrow, so for the next  24 hours you should not drive, make any legal decisions or drink any alcoholic beverages.  You may eat anything you prefer, but it is better to start with liquids then soups and crackers, and gradually work up to solid foods.  Please notify your doctor immediately if you have any unusual bleeding, trouble breathing or pain that is not related to your normal pain.  Depending on the type of procedure that was done, some parts of your body may feel week and/or numb.  This usually clears up by tonight or the next day.  Walk with the use of an assistive device or accompanied by an adult for the 24 hours.  You may use ice on the affected area for the first 24 hours.  Put ice in a Ziploc bag and cover with a towel and place against area 15 minutes on 15 minutes off.  You may switch to heat  after 24 hours.Epidural Steroid Injection Patient Information  Description: The epidural space surrounds the nerves as they exit the spinal cord.  In some patients, the nerves can be compressed and inflamed by a bulging disc or a tight spinal canal (spinal stenosis).  By injecting steroids into the epidural space, we can bring irritated nerves into direct contact with a potentially helpful medication.  These steroids act directly on the irritated nerves and can reduce swelling and inflammation which often leads to decreased pain.  Epidural steroids may be injected anywhere along the spine and from the neck to the low back depending upon the location of your pain.   After numbing the skin with local anesthetic (like Novocaine), a small needle is passed into the epidural space slowly.  You may experience a sensation of pressure while this is being done.  The entire block usually last less than 10 minutes.  Conditions which may be treated by epidural steroids:   Low back and leg pain  Neck and arm pain  Spinal stenosis  Post-laminectomy syndrome  Herpes zoster (shingles) pain  Pain from compression fractures  Preparation for the injection:  1. Do not eat any solid food or dairy products within 8 hours of your appointment.  2. You may drink clear liquids up to 3 hours before appointment.  Clear liquids include water, black coffee, juice or soda.  No milk or cream please. 3. You may take your regular medication, including pain medications, with a sip of water before your appointment  Diabetics should hold regular insulin (if taken separately) and take 1/2 normal NPH dos the morning of the procedure.  Carry some sugar containing items with you to your appointment. 4. A driver must accompany you and be prepared to drive you home after your procedure.  5. Bring all your current medications with your. 6. An IV may be inserted and sedation may be given at the discretion of the physician.   7. A  blood pressure cuff, EKG and other monitors will often be applied during the procedure.  Some patients may need to have extra oxygen administered for a short period. 8. You will be asked to provide medical information, including your allergies, prior to the procedure.  We must know immediately if you are taking blood thinners (like Coumadin/Warfarin)  Or if you are allergic to IV iodine contrast (dye). We must know if you could possible be pregnant.  Possible side-effects:  Bleeding from needle site  Infection (rare, may require surgery)  Nerve injury (rare)  Numbness & tingling (temporary)  Difficulty urinating (rare, temporary)  Spinal headache ( a headache worse with upright posture)  Light -headedness (temporary)  Pain at injection site (several days)  Decreased blood pressure (temporary)  Weakness in arm/leg (temporary)  Pressure sensation in back/neck (temporary)  Call if you experience:  Fever/chills associated with headache or increased back/neck pain.  Headache worsened by an upright position.  New onset weakness or numbness of an extremity below the injection site  Hives or difficulty breathing (go to the emergency room)  Inflammation or drainage at the infection site  Severe back/neck pain  Any new symptoms which are concerning to you  Please note:  Although the local anesthetic injected can often make your back or neck feel good for several hours after the injection, the pain will likely return.  It takes 3-7 days for steroids to work in the epidural space.  You may not notice any pain relief for at least that one week.  If effective, we will often do a series of three injections spaced 3-6 weeks apart to maximally decrease your pain.  After the initial series, we generally will wait several months before considering a repeat injection of the same type.  If you have any questions, please call [phone] [facility]
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
## 2017-10-11 ENCOUNTER — Ambulatory Visit: Payer: BLUE CROSS/BLUE SHIELD | Admitting: Pain Medicine

## 2017-10-11 NOTE — Progress Notes (Deleted)
Patient's Name: Lindsey Drake  MRN: [id number]  Referring Provider: Dorcas Carrow, DO  DOB: 1966-02-10  PCP: Dorcas Carrow, DO  DOS: 10/11/2017  Note by: Oswaldo Done, MD  Service setting: Ambulatory outpatient  Specialty: Interventional Pain Management  Patient type: Established  Location: [facility] (AMB) Pain Management Facility  Visit type: Interventional Procedure   Primary Reason for Visit: Interventional Pain Management Treatment. CC: No chief complaint on file.  Procedure:  Anesthesia, Analgesia, Anxiolysis:  Type: Therapeutic Inter-Laminar Thoracic Epidural Block #2  Region: Posterior Thoracolumbar Level: T6-7 Laterality: Right-Sided Paraspinal  Type: Moderate (Conscious) Sedation combined with Local Anesthesia Indication(s): Analgesia and Anxiety Route: Intravenous (IV) IV Access: Secured Sedation: Meaningful verbal contact was maintained at all times during the procedure  Local Anesthetic: Lidocaine 1-2%   Indications: 1. Chronic thoracic radiculopathy (Right)   2. DDD (degenerative disc disease), thoracic   3. 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 Thoracic spondylosis with radiculopathy    Pain Score: Pre-procedure:  /10 Post-procedure:  /10  Pre-op Assessment:  Lindsey Drake is a 52 y.o. (year old), female patient, seen today for interventional treatment. She  has a past surgical history that includes Partial hysterectomy (2004); Femur fracture surgery (Left, 1983); Bilateral carpal tunnel release (1994); Colonoscopy (05/06/2015); Abdominoplasty (2007); Spine surgery (2001); Upper gi endoscopy (01/14/2015); Breast enhancement surgery (Bilateral); and Augmentation mammaplasty (Bilateral, 2008). Lindsey Drake has a current medication list which includes the following prescription(s): buspirone, fluticasone, gabapentin, glucosamine-chondroitin, ibuprofen, lisinopril, metoprolol succinate, and multivitamin. Her primarily concern today is the No chief complaint on file.  Initial  Vital Signs:  Pulse/HCG Rate:    Temp:   Resp:   BP:   SpO2:    BMI: Estimated body mass index is 32.92 kg/m as calculated from the following:   Height as of 10/03/17:  (1.575 m).   Weight as of 10/03/17: 180 lb (81.6 kg).  Risk Assessment: Allergies: Reviewed. She has No Known Allergies.  Allergy Precautions: None required Coagulopathies: Reviewed. None identified.  Blood-thinner therapy: None at this time Active Infection(s): Reviewed. None identified. Lindsey Drake is afebrile  Site Confirmation: Lindsey Drake was asked to confirm the procedure and laterality before marking the site Procedure checklist: Completed Consent: Before the procedure and under the influence of no sedative(s), amnesic(s), or anxiolytics, the patient was informed of the treatment options, risks and possible complications. To fulfill our ethical and legal obligations, as recommended by the American Medical Association's Code of Ethics, I have informed the patient of my clinical impression; the nature and purpose of the treatment or procedure; the risks, benefits, and possible complications of the intervention; the alternatives, including doing nothing; the risk(s) and benefit(s) of the alternative treatment(s) or procedure(s); and the risk(s) and benefit(s) of doing nothing. The patient was provided information about the general risks and possible complications associated with the procedure. These may include, but are not limited to: failure to achieve desired goals, infection, bleeding, organ or nerve damage, allergic reactions, paralysis, and death. In addition, the patient was informed of those risks and complications associated to Spine-related procedures, such as failure to decrease pain; infection (i.e.: Meningitis, epidural or intraspinal abscess); bleeding (i.e.: epidural hematoma, subarachnoid hemorrhage, or any other type of intraspinal or peri-dural bleeding); organ or nerve damage (i.e.: Any type of  peripheral nerve, nerve root, or spinal cord injury) with subsequent damage to sensory, motor, and/or autonomic systems, resulting in permanent pain, numbness, and/or weakness of one or several areas of the body; allergic  reactions; (i.e.: anaphylactic reaction); and/or death. Furthermore, the patient was informed of those risks and complications associated with the medications. These include, but are not limited to: allergic reactions (i.e.: anaphylactic or anaphylactoid reaction(s)); adrenal axis suppression; blood sugar elevation that in diabetics may result in ketoacidosis or comma; water retention that in patients with history of congestive heart failure may result in shortness of breath, pulmonary edema, and decompensation with resultant heart failure; weight gain; swelling or edema; medication-induced neural toxicity; particulate matter embolism and blood vessel occlusion with resultant organ, and/or nervous system infarction; and/or aseptic necrosis of one or more joints. Finally, the patient was informed that Medicine is not an exact science; therefore, there is also the possibility of unforeseen or unpredictable risks and/or possible complications that may result in a catastrophic outcome. The patient indicated having understood very clearly. We have given the patient no guarantees and we have made no promises. Enough time was given to the patient to ask questions, all of which were answered to the patient's satisfaction. Lindsey Drake has indicated that she wanted to continue with the procedure. Attestation: I, the ordering provider, attest that I have discussed with the patient the benefits, risks, side-effects, alternatives, likelihood of achieving goals, and potential problems during recovery for the procedure that I have provided informed consent. Date  Time: {CHL [facility]-PAIN TIME CHOICES:21018001}  Pre-Procedure Preparation:  Monitoring: As per clinic protocol. Respiration, ETCO2, SpO2, BP, heart  rate and rhythm monitor placed and checked for adequate function Safety Precautions: Patient was assessed for positional comfort and pressure points before starting the procedure. Time-out: I initiated and conducted the "Time-out" before starting the procedure, as per protocol. The patient was asked to participate by confirming the accuracy of the "Time Out" information. Verification of the correct person, site, and procedure were performed and confirmed by me, the nursing staff, and the patient. "Time-out" conducted as per Joint Commission's Universal Protocol (UP.01.01.01). Time:    Description of Procedure Process:  Position: Prone Target Area: For Epidural Steroid injection(s), the target area is the  interlaminar space, initially targeting the lower border of the superior vertebral body lamina. Approach: Interlaminar approach. Area Prepped: Entire Posterior Thoracolumbar Region Prepping solution: ChloraPrep (2% chlorhexidine gluconate and 70% isopropyl alcohol) Safety Precautions: Aspiration looking for blood return was conducted prior to all injections. At no point did we inject any substances, as a needle was being advanced. No attempts were made at seeking any paresthesias. Safe injection practices and needle disposal techniques used. Medications properly checked for expiration dates. SDV (single dose vial) medications used. Description of the Procedure: Protocol guidelines were followed. The patient was placed in position over the fluoroscopy table. The target area was identified and the area prepped in the usual manner. Skin & deeper tissues infiltrated with local anesthetic. Appropriate amount of time allowed to pass for local anesthetics to take effect. The procedure needles were then advanced to the target area. The inferior aspect of the superior lamina was contacted and the needle walked caudad, until the lamina was cleared. The epidural space was identified using "loss-of-resistance  technique" with 0.9% PF-NSS (2-17mL), in a low friction 10cc LOR glass syringe. Proper needle placement was secured. Negative aspiration confirmed. Solution injected in intermittent fashion, asking for systemic symptoms every 0.5 cc of injectate. The needles were then removed and the area cleansed, making sure to leave some of the prepping solution behind to take advantage of its long term bactericidal properties. There were no vitals filed for this visit.  Start Time:   hrs. End Time:   hrs. Materials:  Needle(s) Type: Epidural needle Gauge: 17G Length: 3.5-in Medication(s): Please see orders for medications and dosing details.  Imaging Guidance (Spinal):  Type of Imaging Technique: Fluoroscopy Guidance (Spinal) Indication(s): Assistance in needle guidance and placement for procedures requiring needle placement in or near specific anatomical locations not easily accessible without such assistance. Exposure Time: Please see nurses notes. Contrast: Before injecting any contrast, we confirmed that the patient did not have an allergy to iodine, shellfish, or radiological contrast. Once satisfactory needle placement was completed at the desired level, radiological contrast was injected. Contrast injected under live fluoroscopy. No contrast complications. See chart for type and volume of contrast used. Fluoroscopic Guidance: I was personally present during the use of fluoroscopy. "Tunnel Vision Technique" used to obtain the best possible view of the target area. Parallax error corrected before commencing the procedure. "Direction-depth-direction" technique used to introduce the needle under continuous pulsed fluoroscopy. Once target was reached, antero-posterior, oblique, and lateral fluoroscopic projection used confirm needle placement in all planes. Images permanently stored in EMR. Interpretation: I personally interpreted the imaging intraoperatively. Adequate needle placement confirmed in multiple  planes. Appropriate spread of contrast into desired area was observed. No evidence of afferent or efferent intravascular uptake. No intrathecal or subarachnoid spread observed. Permanent images saved into the patient's record.  Antibiotic Prophylaxis:   Anti-infectives (From admission, onward)   None     Indication(s): None identified  Post-operative Assessment:  Post-procedure Vital Signs:  Pulse/HCG Rate:    Temp:   Resp:   BP:   SpO2:    EBL: None  Complications: No immediate post-treatment complications observed by team, or reported by patient.  Note: The patient tolerated the entire procedure well. A repeat set of vitals were taken after the procedure and the patient was kept under observation following institutional policy, for this type of procedure. Post-procedural neurological assessment was performed, showing return to baseline, prior to discharge. The patient was provided with post-procedure discharge instructions, including a section on how to identify potential problems. Should any problems arise concerning this procedure, the patient was given instructions to immediately contact us, at any time, without hesitation. In any case, we plan to contact the patient by telephone for a follow-up status report regarding this interventional procedure.  Comments:  No additional relevant information.  Plan of Care   Imaging Orders  No imaging studies ordered today   Procedure Orders    No procedure(s) ordered today    Medications ordered for procedure: No orders of the defined types were placed in this encounter.  Medications administered: Sula Rumple had no medications administered during this visit.  See the medical record for exact dosing, route, and time of administration.  New Prescriptions   No medications on file   Disposition: Discharge home  Discharge Date & Time: 10/11/2017;   hrs.   Physician-requested Follow-up: No follow-ups on file.  Future  Appointments  Date Time Provider Department Center  10/11/2017  1:15 PM Delano Metz, MD [facility] None  01/09/2018  3:30 PM Dorcas Carrow, DO [facility]   Primary Care Physician: Dorcas Carrow, DO Location: [facility] Out[facility] Facility Note by: Oswaldo Done, MD Date: 10/11/2017; Time: 7:12 AM  Disclaimer:  Medicine is not an Visual merchandiser. The only guarantee in medicine is that nothing is guaranteed. It is important to note that the decision to proceed with this intervention was based on the information collected from  the patient. The Data and conclusions were drawn from the patient's questionnaire, the interview, and the physical examination. Because the information was provided in large part by the patient, it cannot be guaranteed that it has not been purposely or unconsciously manipulated. Every effort has been made to obtain as much relevant data as possible for this evaluation. It is important to note that the conclusions that lead to this procedure are derived in large part from the available data. Always take into account that the treatment will also be dependent on availability of resources and existing treatment guidelines, considered by other Pain Management Practitioners as being common knowledge and practice, at the time of the intervention. For Medico-Legal purposes, it is also important to point out that variation in procedural techniques and pharmacological choices are the acceptable norm. The indications, contraindications, technique, and results of the above procedure should only be interpreted and judged by a Board-Certified Interventional Pain Specialist with extensive familiarity and expertise in the same exact procedure and technique.

## 2017-10-28 ENCOUNTER — Encounter: Payer: Self-pay | Admitting: Family Medicine

## 2017-10-30 ENCOUNTER — Other Ambulatory Visit: Payer: Self-pay | Admitting: Pain Medicine

## 2017-10-30 DIAGNOSIS — M792 Neuralgia and neuritis, unspecified: Secondary | ICD-10-CM

## 2018-01-09 ENCOUNTER — Ambulatory Visit: Payer: BLUE CROSS/BLUE SHIELD | Admitting: Family Medicine

## 2018-01-19 ENCOUNTER — Ambulatory Visit: Payer: BLUE CROSS/BLUE SHIELD | Admitting: Family Medicine

## 2018-02-14 ENCOUNTER — Other Ambulatory Visit: Payer: Self-pay

## 2018-02-14 ENCOUNTER — Encounter: Payer: Self-pay | Admitting: Family Medicine

## 2018-02-14 ENCOUNTER — Ambulatory Visit: Payer: BLUE CROSS/BLUE SHIELD | Admitting: Family Medicine

## 2018-02-14 VITALS — BP 135/72 | HR 89 | Temp 98.2°F | Ht 62.0 in | Wt 190.0 lb

## 2018-02-14 DIAGNOSIS — I10 Essential (primary) hypertension: Secondary | ICD-10-CM

## 2018-02-14 DIAGNOSIS — F411 Generalized anxiety disorder: Secondary | ICD-10-CM | POA: Diagnosis not present

## 2018-02-14 DIAGNOSIS — D72829 Elevated white blood cell count, unspecified: Secondary | ICD-10-CM

## 2018-02-14 DIAGNOSIS — Z23 Encounter for immunization: Secondary | ICD-10-CM | POA: Diagnosis not present

## 2018-02-14 MED ORDER — FLUTICASONE PROPIONATE 50 MCG/ACT NA SUSP: 2.0000 | Freq: Every day | NASAL | 6 refills | Status: DC

## 2018-02-14 MED ORDER — BUPROPION HCL ER (SR) 150 MG PO TB12: ORAL_TABLET | ORAL | 3 refills | Status: DC

## 2018-02-14 MED ORDER — LISINOPRIL 10 MG PO TABS: 10.0000 mg | ORAL_TABLET | Freq: Every day | ORAL | 1 refills | Status: DC

## 2018-02-14 MED ORDER — METOPROLOL SUCCINATE ER 25 MG PO TB24: 25.0000 mg | ORAL_TABLET | Freq: Every day | ORAL | 1 refills | Status: DC

## 2018-02-14 MED ORDER — BUSPIRONE HCL 5 MG PO TABS: 5.0000 mg | ORAL_TABLET | Freq: Two times a day (BID) | ORAL | 1 refills | Status: DC

## 2018-02-14 NOTE — Assessment & Plan Note (Signed)
Rechecking levels today. Await results. Call with any concerns.  

## 2018-02-14 NOTE — Assessment & Plan Note (Signed)
Under good control on current regimen. Continue current regimen. Continue to monitor. Call with any concerns. Refills given.   

## 2018-02-14 NOTE — Assessment & Plan Note (Signed)
Not under good control. Will start wellbutrin and will recheck 1 month. Continue buspar. Call with any concerns.

## 2018-02-14 NOTE — Progress Notes (Signed)
BP 135/72 (BP Location: Left Arm, Cuff Size: Normal)   Pulse 89   Temp 98.2 F (36.8 C) (Oral)   Ht 5\' 2"  (1.575 m)   Wt 190 lb (86.2 kg)   SpO2 96%   BMI 34.75 kg/m    Subjective:    Patient ID: Lindsey Drake, female    DOB: 1966-05-02, 52 y.o.   MRN: [id number]  HPI: Lindsey Drake is a 52 y.o. female  Chief Complaint  Patient presents with  . Hypertension  . Anxiety   HYPERTENSION Hypertension status: controlled  Satisfied with current treatment? yes Duration of hypertension: chronic BP monitoring frequency:  not checking BP medication side effects:  no Medication compliance: excellent compliance Previous BP meds: lisinopril, metoprolol Aspirin: no Recurrent headaches: no Visual changes: no Palpitations: no Dyspnea: no Chest pain: no Lower extremity edema: no Dizzy/lightheaded: no  ANXIETY/STRESS Duration:stable Anxious mood: yes  Excessive worrying: yes Irritability: no  Sweating: no Nausea: no Palpitations:no Hyperventilation: no Panic attacks: no Agoraphobia: no  Obscessions/compulsions: yes Depressed mood: yes Depression screen [facility] 2/9 02/14/2018 10/03/2017 09/06/2017 06/09/2017 03/17/2017  Decreased Interest 3 0 0 3 0  Down, Depressed, Hopeless 1 0 0 0 0  PHQ - 2 Score 4 0 0 3 0  Altered sleeping 3 - - 3 -  Tired, decreased energy 3 - - 3 -  Change in appetite 2 - - 3 -  Feeling bad or failure about yourself  0 - - 0 -  Trouble concentrating 0 - - 1 -  Moving slowly or fidgety/restless 0 - - 0 -  Suicidal thoughts 0 - - 0 -  PHQ-9 Score 12 - - 13 -  Difficult doing work/chores Somewhat difficult - - Very difficult -   GAD 7 : Generalized Anxiety Score 02/14/2018 07/12/2017 06/09/2017 11/03/2015  Nervous, Anxious, on Edge 2 3 3 3   Control/stop worrying 2 - 3 3  Worry too much - different things 3 3 3 3   Trouble relaxing 0 1 2 2   Restless 0 0 0 0  Easily annoyed or irritable 3 3 3 3   Afraid - awful might happen 0 0 0 0  Total GAD 7 Score 10 -  14 14  Anxiety Difficulty Extremely difficult Somewhat difficult Very difficult Very difficult   Anhedonia: yes Weight changes: no Insomnia: no   Hypersomnia: no Fatigue/loss of energy: yes Feelings of worthlessness: yes Feelings of guilt: yes Impaired concentration/indecisiveness: no Suicidal ideations: no  Crying spells: yes Recent Stressors/Life Changes: yes   Relationship problems: no   Family stress: yes     Financial stress: no    Job stress: no    Recent death/loss: no   Relevant past medical, surgical, family and social history reviewed and updated as indicated. Interim medical history since our last visit reviewed. Allergies and medications reviewed and updated.  Review of Systems  Constitutional: Negative.   Respiratory: Negative.   Cardiovascular: Negative.   Skin: Negative.   Neurological: Negative.   Psychiatric/Behavioral: Positive for dysphoric mood. Negative for agitation, behavioral problems, confusion, decreased concentration, hallucinations, self-injury, sleep disturbance and suicidal ideas. The patient is nervous/anxious. The patient is not hyperactive.     Per HPI unless specifically indicated above     Objective:    BP 135/72 (BP Location: Left Arm, Cuff Size: Normal)   Pulse 89   Temp 98.2 F (36.8 C) (Oral)   Ht 5\' 2"  (1.575 m)   Wt 190 lb (86.2 kg)   SpO2  96%   BMI 34.75 kg/m   Wt Readings from Last 3 Encounters:  02/14/18 190 lb (86.2 kg)  10/03/17 180 lb (81.6 kg)  09/06/17 185 lb (83.9 kg)    Physical Exam  Constitutional: She is oriented to person, place, and time. She appears well-developed and well-nourished. No distress.  HENT:  Head: Normocephalic and atraumatic.  Right Ear: Hearing normal.  Left Ear: Hearing normal.  Nose: Nose normal.  Eyes: Conjunctivae and lids are normal. Right eye exhibits no discharge. Left eye exhibits no discharge. No scleral icterus.  Cardiovascular: Normal rate, regular rhythm, normal heart sounds  and intact distal pulses. Exam reveals no gallop and no friction rub.  No murmur heard. Pulmonary/Chest: Effort normal and breath sounds normal. No stridor. No respiratory distress. She has no wheezes. She has no rales. She exhibits no tenderness.  Musculoskeletal: Normal range of motion.  Neurological: She is alert and oriented to person, place, and time.  Skin: Skin is warm, dry and intact. Capillary refill takes less than 2 seconds. No rash noted. She is not diaphoretic. No erythema. No pallor.  Psychiatric: She has a normal mood and affect. Her speech is normal and behavior is normal. Judgment and thought content normal. Cognition and memory are normal.  Nursing note and vitals reviewed.   Results for orders placed or performed in visit on 07/12/17  Basic metabolic panel  Result Value Ref Range   Glucose 103 (H) 65 - 99 mg/dL   BUN 12 6 - 24 mg/dL   Creatinine, Ser 1.61 0.57 - 1.00 mg/dL   GFR calc non Af Amer 84 >59 mL/min/1.73   GFR calc Af Amer 97 >59 mL/min/1.73   BUN/Creatinine Ratio 15 9 - 23   Sodium 142 134 - 144 mmol/L   Potassium 3.7 3.5 - 5.2 mmol/L   Chloride 104 96 - 106 mmol/L   CO2 23 20 - 29 mmol/L   Calcium 9.2 8.7 - 10.2 mg/dL  CBC with Differential/Platelet  Result Value Ref Range   WBC 10.2 3.4 - 10.8 x10E3/uL   RBC 4.01 3.77 - 5.28 x10E6/uL   Hemoglobin 12.2 11.1 - 15.9 g/dL   Hematocrit 09.6 04.5 - 46.6 %   MCV 88 79 - 97 fL   MCH 30.4 26.6 - 33.0 pg   MCHC 34.6 31.5 - 35.7 g/dL   RDW 40.9 81.1 - 91.4 %   Platelets 306 150 - 379 x10E3/uL   Neutrophils 63 Not Estab. %   Lymphs 30 Not Estab. %   Monocytes 6 Not Estab. %   Eos 1 Not Estab. %   Basos 0 Not Estab. %   Neutrophils Absolute 6.4 1.4 - 7.0 x10E3/uL   Lymphocytes Absolute 3.1 0.7 - 3.1 x10E3/uL   Monocytes Absolute 0.6 0.1 - 0.9 x10E3/uL   EOS (ABSOLUTE) 0.1 0.0 - 0.4 x10E3/uL   Basophils Absolute 0.0 0.0 - 0.2 x10E3/uL   Immature Granulocytes 0 Not Estab. %   Immature Grans (Abs) 0.0 0.0  - 0.1 x10E3/uL      Assessment & Plan:   Problem List Items Addressed This Visit      Cardiovascular and Mediastinum   Essential hypertension, benign - Primary    Under good control on current regimen. Continue current regimen. Continue to monitor. Call with any concerns. Refills given.        Relevant Medications   metoprolol succinate (TOPROL-XL) 25 MG 24 hr tablet   lisinopril (PRINIVIL,ZESTRIL) 10 MG tablet   Other Relevant Orders  Basic metabolic panel     Other   Anxiety disorder    Not under good control. Will start wellbutrin and will recheck 1 month. Continue buspar. Call with any concerns.       Relevant Medications   buPROPion (WELLBUTRIN SR) 150 MG 12 hr tablet   busPIRone (BUSPAR) 5 MG tablet   Elevated WBC count    Rechecking levels today. Await results. Call with any concerns.       Relevant Orders   CBC with Differential/Platelet    Other Visit Diagnoses    Flu vaccine need       Flu shot given today.   Relevant Orders   Flu Vaccine QUAD 36+ mos IM       Follow up plan: Return in about 4 weeks (around 03/14/2018) for Follow up mood.

## 2018-02-15 ENCOUNTER — Encounter: Payer: Self-pay | Admitting: Family Medicine

## 2018-02-15 LAB — BASIC METABOLIC PANEL
BUN/Creatinine Ratio: 19 (ref 9–23)
BUN: 13 mg/dL (ref 6–24)
CO2: 22 mmol/L (ref 20–29)
Calcium: 9.6 mg/dL (ref 8.7–10.2)
Chloride: 103 mmol/L (ref 96–106)
Creatinine, Ser: 0.68 mg/dL (ref 0.57–1.00)
GFR calc Af Amer: 116 mL/min/{1.73_m2} (ref >59)
GFR calc non Af Amer: 101 mL/min/{1.73_m2} (ref >59)
Glucose: 82 mg/dL (ref 65–99)
Potassium: 4 mmol/L (ref 3.5–5.2)
Sodium: 140 mmol/L (ref 134–144)

## 2018-02-15 LAB — CBC WITH DIFFERENTIAL/PLATELET
Basophils Absolute: 0 10*3/uL (ref 0.0–0.2)
Basos: 0 %
EOS (ABSOLUTE): 0.2 10*3/uL (ref 0.0–0.4)
Eos: 1 %
Hematocrit: 38.7 % (ref 34.0–46.6)
Hemoglobin: 13 g/dL (ref 11.1–15.9)
Immature Grans (Abs): 0 10*3/uL (ref 0.0–0.1)
Immature Granulocytes: 0 %
Lymphocytes Absolute: 3.8 10*3/uL — ABNORMAL HIGH (ref 0.7–3.1)
Lymphs: 33 %
MCH: 30 pg (ref 26.6–33.0)
MCHC: 33.6 g/dL (ref 31.5–35.7)
MCV: 89 fL (ref 79–97)
Monocytes Absolute: 0.7 10*3/uL (ref 0.1–0.9)
Monocytes: 6 %
Neutrophils Absolute: 7 10*3/uL (ref 1.4–7.0)
Neutrophils: 60 %
Platelets: 308 10*3/uL (ref 150–450)
RBC: 4.34 x10E6/uL (ref 3.77–5.28)
RDW: 13.7 % (ref 12.3–15.4)
WBC: 11.7 10*3/uL — ABNORMAL HIGH (ref 3.4–10.8)

## 2018-03-20 ENCOUNTER — Encounter: Payer: Self-pay | Admitting: Family Medicine

## 2018-03-20 ENCOUNTER — Ambulatory Visit: Payer: BLUE CROSS/BLUE SHIELD | Admitting: Family Medicine

## 2018-03-20 VITALS — BP 144/93 | HR 87 | Temp 98.7°F | Ht 62.0 in | Wt 189.4 lb

## 2018-03-20 DIAGNOSIS — F411 Generalized anxiety disorder: Secondary | ICD-10-CM | POA: Diagnosis not present

## 2018-03-20 MED ORDER — BUPROPION HCL ER (XL) 300 MG PO TB24: 300.0000 mg | ORAL_TABLET | Freq: Every day | ORAL | 3 refills | Status: DC

## 2018-03-20 NOTE — Progress Notes (Signed)
BP (!) 144/93   Pulse 87   Temp 98.7 F (37.1 C) (Oral)   Ht 5\' 2"  (1.575 m)   Wt 189 lb 6.4 oz (85.9 kg)   SpO2 93%   BMI 34.64 kg/m    Subjective:    Patient ID: Lindsey Drake, female    DOB: 1965/10/04, 52 y.o.   MRN: [id number]  HPI: Lindsey Drake is a 52 y.o. female  Chief Complaint  Patient presents with  . Depression   DEPRESSION Mood status: better Satisfied with current treatment?: no Symptom severity: moderate  Duration of current treatment : months Side effects: no Medication compliance: good compliance Psychotherapy/counseling: no  Previous psychiatric medications: wellbutrin Depressed mood: yes Anxious mood: yes Anhedonia: no Significant weight loss or gain: no Insomnia: yes hard to fall asleep Fatigue: yes Feelings of worthlessness or guilt: yes Impaired concentration/indecisiveness: yes Suicidal ideations: no Hopelessness: no Crying spells: yes Depression screen [facility] 2/9 03/20/2018 02/14/2018 10/03/2017 09/06/2017 06/09/2017  Decreased Interest 1 3 0 0 3  Down, Depressed, Hopeless 0 1 0 0 0  PHQ - 2 Score 1 4 0 0 3  Altered sleeping 3 3 - - 3  Tired, decreased energy 1 3 - - 3  Change in appetite 2 2 - - 3  Feeling bad or failure about yourself  0 0 - - 0  Trouble concentrating 0 0 - - 1  Moving slowly or fidgety/restless 0 0 - - 0  Suicidal thoughts 0 0 - - 0  PHQ-9 Score 7 12 - - 13  Difficult doing work/chores Somewhat difficult Somewhat difficult - - Very difficult   GAD 7 : Generalized Anxiety Score 03/20/2018 02/14/2018 07/12/2017 06/09/2017  Nervous, Anxious, on Edge 1 2 3 3   Control/stop worrying 2 2 - 3  Worry too much - different things 2 3 3 3   Trouble relaxing 0 0 1 2  Restless 0 0 0 0  Easily annoyed or irritable 0 3 3 3   Afraid - awful might happen 0 0 0 0  Total GAD 7 Score 5 10 - 14  Anxiety Difficulty Not difficult at all Extremely difficult Somewhat difficult Very difficult   Relevant past medical, surgical, family and  social history reviewed and updated as indicated. Interim medical history since our last visit reviewed. Allergies and medications reviewed and updated.  Review of Systems  Constitutional: Negative.   Respiratory: Negative.   Cardiovascular: Negative.   Skin: Negative.   Psychiatric/Behavioral: Positive for dysphoric mood. Negative for agitation, behavioral problems, confusion, decreased concentration, hallucinations, self-injury, sleep disturbance and suicidal ideas. The patient is nervous/anxious. The patient is not hyperactive.     Per HPI unless specifically indicated above     Objective:    BP (!) 144/93   Pulse 87   Temp 98.7 F (37.1 C) (Oral)   Ht 5\' 2"  (1.575 m)   Wt 189 lb 6.4 oz (85.9 kg)   SpO2 93%   BMI 34.64 kg/m   Wt Readings from Last 3 Encounters:  03/20/18 189 lb 6.4 oz (85.9 kg)  02/14/18 190 lb (86.2 kg)  10/03/17 180 lb (81.6 kg)    Physical Exam  Constitutional: She is oriented to person, place, and time. She appears well-developed and well-nourished. No distress.  HENT:  Head: Normocephalic and atraumatic.  Right Ear: Hearing normal.  Left Ear: Hearing normal.  Nose: Nose normal.  Eyes: Conjunctivae and lids are normal. Right eye exhibits no discharge. Left eye exhibits no discharge. No  scleral icterus.  Cardiovascular: Normal rate, regular rhythm, normal heart sounds and intact distal pulses. Exam reveals no gallop and no friction rub.  No murmur heard. Pulmonary/Chest: Effort normal and breath sounds normal. No stridor. No respiratory distress. She has no wheezes. She has no rales. She exhibits no tenderness.  Musculoskeletal: Normal range of motion.  Neurological: She is alert and oriented to person, place, and time.  Skin: Skin is warm, dry and intact. Capillary refill takes less than 2 seconds. No rash noted. She is not diaphoretic. No erythema. No pallor.  Psychiatric: She has a normal mood and affect. Her speech is normal and behavior is  normal. Judgment and thought content normal. Cognition and memory are normal.  Nursing note and vitals reviewed.   Results for orders placed or performed in visit on 02/14/18  CBC with Differential/Platelet  Result Value Ref Range   WBC 11.7 (H) 3.4 - 10.8 x10E3/uL   RBC 4.34 3.77 - 5.28 x10E6/uL   Hemoglobin 13.0 11.1 - 15.9 g/dL   Hematocrit 16.1 09.6 - 46.6 %   MCV 89 79 - 97 fL   MCH 30.0 26.6 - 33.0 pg   MCHC 33.6 31.5 - 35.7 g/dL   RDW 04.5 40.9 - 81.1 %   Platelets 308 150 - 450 x10E3/uL   Neutrophils 60 Not Estab. %   Lymphs 33 Not Estab. %   Monocytes 6 Not Estab. %   Eos 1 Not Estab. %   Basos 0 Not Estab. %   Neutrophils Absolute 7.0 1.4 - 7.0 x10E3/uL   Lymphocytes Absolute 3.8 (H) 0.7 - 3.1 x10E3/uL   Monocytes Absolute 0.7 0.1 - 0.9 x10E3/uL   EOS (ABSOLUTE) 0.2 0.0 - 0.4 x10E3/uL   Basophils Absolute 0.0 0.0 - 0.2 x10E3/uL   Immature Granulocytes 0 Not Estab. %   Immature Grans (Abs) 0.0 0.0 - 0.1 x10E3/uL  Basic metabolic panel  Result Value Ref Range   Glucose 82 65 - 99 mg/dL   BUN 13 6 - 24 mg/dL   Creatinine, Ser 9.14 0.57 - 1.00 mg/dL   GFR calc non Af Amer 101 >59 mL/min/1.73   GFR calc Af Amer 116 >59 mL/min/1.73   BUN/Creatinine Ratio 19 9 - 23   Sodium 140 134 - 144 mmol/L   Potassium 4.0 3.5 - 5.2 mmol/L   Chloride 103 96 - 106 mmol/L   CO2 22 20 - 29 mmol/L   Calcium 9.6 8.7 - 10.2 mg/dL      Assessment & Plan:   Problem List Items Addressed This Visit      Other   Anxiety disorder - Primary    Doing better. Feeling more like herself, but not quite there. Will increase wellbutrin from 150mg  to 300mg  and change to extended release. Call with any concerns. Continue to monitor.       Relevant Medications   buPROPion (WELLBUTRIN XL) 300 MG 24 hr tablet       Follow up plan: Return in about 4 weeks (around 04/17/2018) for follow up mood.

## 2018-03-20 NOTE — Assessment & Plan Note (Signed)
Doing better. Feeling more like herself, but not quite there. Will increase wellbutrin from 150mg  to 300mg  and change to extended release. Call with any concerns. Continue to monitor.

## 2018-04-18 ENCOUNTER — Ambulatory Visit: Payer: BLUE CROSS/BLUE SHIELD | Admitting: Family Medicine

## 2018-05-23 ENCOUNTER — Ambulatory Visit: Payer: BLUE CROSS/BLUE SHIELD | Admitting: Family Medicine

## 2018-06-15 ENCOUNTER — Encounter: Payer: Self-pay | Admitting: Family Medicine

## 2018-06-15 ENCOUNTER — Ambulatory Visit (INDEPENDENT_AMBULATORY_CARE_PROVIDER_SITE_OTHER): Payer: PRIVATE HEALTH INSURANCE | Admitting: Family Medicine

## 2018-06-15 VITALS — BP 132/90 | HR 108 | Temp 98.0°F | Ht 62.0 in | Wt 188.8 lb

## 2018-06-15 DIAGNOSIS — F411 Generalized anxiety disorder: Secondary | ICD-10-CM

## 2018-06-15 MED ORDER — BUPROPION HCL ER (SR) 150 MG PO TB12: 300.0000 mg | ORAL_TABLET | Freq: Two times a day (BID) | ORAL | 1 refills | Status: DC

## 2018-06-15 NOTE — Progress Notes (Signed)
BP 132/90 (BP Location: Left Arm, Cuff Size: Normal)   Pulse (!) 108   Temp 98 F (36.7 C) (Oral)   Ht 5\' 2"  (1.575 m)   Wt 188 lb 12.8 oz (85.6 kg)   SpO2 94%   BMI 34.53 kg/m    Subjective:    Patient ID: Lindsey Drake, female    DOB: 06/26/1965, 53 y.o.   MRN: [id number]  HPI: Lindsey Drake is a 53 y.o. female  Chief Complaint  Patient presents with  . Anxiety   ANXIETY/STRESS- doing better. Feels better on the wellbutrin. Feels like the XR medicine doesn't work as well.  Duration:better Anxious mood: yes  Excessive worrying: no Irritability: no  Sweating: no Nausea: no Palpitations:no Hyperventilation: no Panic attacks: no Agoraphobia: no  Obscessions/compulsions: no Depressed mood: yes Depression screen [facility] 2/9 06/15/2018 03/20/2018 02/14/2018 10/03/2017 09/06/2017  Decreased Interest 1 1 3  0 0  Down, Depressed, Hopeless 1 0 1 0 0  PHQ - 2 Score 2 1 4  0 0  Altered sleeping 3 3 3  - -  Tired, decreased energy 1 1 3  - -  Change in appetite 0 2 2 - -  Feeling bad or failure about yourself  0 0 0 - -  Trouble concentrating 0 0 0 - -  Moving slowly or fidgety/restless 0 0 0 - -  Suicidal thoughts 0 0 0 - -  PHQ-9 Score 6 7 12  - -  Difficult doing work/chores Somewhat difficult Somewhat difficult Somewhat difficult - -   GAD 7 : Generalized Anxiety Score 06/15/2018 03/20/2018 02/14/2018 07/12/2017  Nervous, Anxious, on Edge 1 1 2 3   Control/stop worrying 1 2 2  -  Worry too much - different things 1 2 3 3   Trouble relaxing 0 0 0 1  Restless 0 0 0 0  Easily annoyed or irritable 1 0 3 3  Afraid - awful might happen 0 0 0 0  Total GAD 7 Score 4 5 10  -  Anxiety Difficulty Not difficult at all Not difficult at all Extremely difficult Somewhat difficult   Anhedonia: no Weight changes: no Insomnia: yes   Hypersomnia: no Fatigue/loss of energy: yes Feelings of worthlessness: yes Feelings of guilt: yes Impaired concentration/indecisiveness: yes Suicidal ideations:  no  Crying spells: no Recent Stressors/Life Changes: no   Relationship problems: no   Family stress: no     Financial stress: no    Job stress: no    Recent death/loss: no   Relevant past medical, surgical, family and social history reviewed and updated as indicated. Interim medical history since our last visit reviewed. Allergies and medications reviewed and updated.  Review of Systems  Constitutional: Negative.   Respiratory: Negative.   Cardiovascular: Negative.   Neurological: Negative.   Psychiatric/Behavioral: Negative.     Per HPI unless specifically indicated above     Objective:    BP 132/90 (BP Location: Left Arm, Cuff Size: Normal)   Pulse (!) 108   Temp 98 F (36.7 C) (Oral)   Ht 5\' 2"  (1.575 m)   Wt 188 lb 12.8 oz (85.6 kg)   SpO2 94%   BMI 34.53 kg/m   Wt Readings from Last 3 Encounters:  06/15/18 188 lb 12.8 oz (85.6 kg)  03/20/18 189 lb 6.4 oz (85.9 kg)  02/14/18 190 lb (86.2 kg)    Physical Exam Vitals signs and nursing note reviewed.  Constitutional:      General: She is not in acute distress.  Appearance: Normal appearance. She is not ill-appearing, toxic-appearing or diaphoretic.  HENT:     Head: Normocephalic and atraumatic.     Right Ear: External ear normal.     Left Ear: External ear normal.     Nose: Nose normal.     Mouth/Throat:     Mouth: Mucous membranes are moist.     Pharynx: Oropharynx is clear.  Eyes:     General: No scleral icterus.       Right eye: No discharge.        Left eye: No discharge.     Extraocular Movements: Extraocular movements intact.     Conjunctiva/sclera: Conjunctivae normal.     Pupils: Pupils are equal, round, and reactive to light.  Neck:     Musculoskeletal: Normal range of motion and neck supple.  Cardiovascular:     Rate and Rhythm: Normal rate and regular rhythm.     Pulses: Normal pulses.     Heart sounds: Normal heart sounds. No murmur. No friction rub. No gallop.   Pulmonary:     Effort:  Pulmonary effort is normal. No respiratory distress.     Breath sounds: Normal breath sounds. No stridor. No wheezing, rhonchi or rales.  Chest:     Chest wall: No tenderness.  Musculoskeletal: Normal range of motion.  Skin:    General: Skin is warm and dry.     Capillary Refill: Capillary refill takes less than 2 seconds.     Coloration: Skin is not jaundiced or pale.     Findings: No bruising, erythema, lesion or rash.  Neurological:     General: No focal deficit present.     Mental Status: She is alert and oriented to person, place, and time. Mental status is at baseline.  Psychiatric:        Mood and Affect: Mood normal.        Behavior: Behavior normal.        Thought Content: Thought content normal.        Judgment: Judgment normal.     Results for orders placed or performed in visit on 02/14/18  CBC with Differential/Platelet  Result Value Ref Range   WBC 11.7 (H) 3.4 - 10.8 x10E3/uL   RBC 4.34 3.77 - 5.28 x10E6/uL   Hemoglobin 13.0 11.1 - 15.9 g/dL   Hematocrit 76.1 60.7 - 46.6 %   MCV 89 79 - 97 fL   MCH 30.0 26.6 - 33.0 pg   MCHC 33.6 31.5 - 35.7 g/dL   RDW 37.1 06.2 - 69.4 %   Platelets 308 150 - 450 x10E3/uL   Neutrophils 60 Not Estab. %   Lymphs 33 Not Estab. %   Monocytes 6 Not Estab. %   Eos 1 Not Estab. %   Basos 0 Not Estab. %   Neutrophils Absolute 7.0 1.4 - 7.0 x10E3/uL   Lymphocytes Absolute 3.8 (H) 0.7 - 3.1 x10E3/uL   Monocytes Absolute 0.7 0.1 - 0.9 x10E3/uL   EOS (ABSOLUTE) 0.2 0.0 - 0.4 x10E3/uL   Basophils Absolute 0.0 0.0 - 0.2 x10E3/uL   Immature Granulocytes 0 Not Estab. %   Immature Grans (Abs) 0.0 0.0 - 0.1 x10E3/uL  Basic metabolic panel  Result Value Ref Range   Glucose 82 65 - 99 mg/dL   BUN 13 6 - 24 mg/dL   Creatinine, Ser 8.54 0.57 - 1.00 mg/dL   GFR calc non Af Amer 101 >59 mL/min/1.73   GFR calc Af Amer 116 >59 mL/min/1.73  BUN/Creatinine Ratio 19 9 - 23   Sodium 140 134 - 144 mmol/L   Potassium 4.0 3.5 - 5.2 mmol/L    Chloride 103 96 - 106 mmol/L   CO2 22 20 - 29 mmol/L   Calcium 9.6 8.7 - 10.2 mg/dL      Assessment & Plan:   Problem List Items Addressed This Visit      Other   Anxiety disorder - Primary    Doing better with the wellbutrin, but her meds seem to wear off during the day. Will change back to 300mg  BID and recheck 3 months. Call with any concerns. Buspar PRN.       Relevant Medications   buPROPion (WELLBUTRIN SR) 150 MG 12 hr tablet       Follow up plan: Return in about 3 months (around 09/14/2018) for Physical.

## 2018-06-15 NOTE — Assessment & Plan Note (Signed)
Doing better with the wellbutrin, but her meds seem to wear off during the day. Will change back to 300mg  BID and recheck 3 months. Call with any concerns. Buspar PRN.

## 2018-06-23 ENCOUNTER — Other Ambulatory Visit: Payer: Self-pay

## 2018-06-23 ENCOUNTER — Telehealth: Payer: Self-pay | Admitting: Family Medicine

## 2018-06-23 MED ORDER — BUPROPION HCL ER (SR) 200 MG PO TB12: 200.0000 mg | ORAL_TABLET | Freq: Two times a day (BID) | ORAL | 1 refills | Status: DC

## 2018-06-23 NOTE — Telephone Encounter (Signed)
I've sent her in the 200mg  BID. That should be covered. If she has issues with it, let me know

## 2018-06-23 NOTE — Telephone Encounter (Signed)
Prior authorization for buPROPion was initiated via [email or web address]. Key: [id number]

## 2018-06-23 NOTE — Telephone Encounter (Signed)
Copied from CRM [id number]. Topic: General - Other >> Jun 23, 2018  9:56 AM Jaquita Rectoravis, Karen A wrote: Reason for CRM: Patient called to say that she can not get the Rx sent in for buPROPion [facility](WELLBUTRIN SR) 150 MG 12 hr tablet due to the Rx asking for 4 tabs a day. Per pharmacy the insurance will pay for three tabs a day. Asking either for the prior authorization to be done or a new Rx for 3 tabs a day. Please advise

## 2018-06-23 NOTE — Telephone Encounter (Signed)
Explained on VM what Dr. Laural Benes stated (DPR reviewed and VM was personalized). Explained to call back if she had any other concerns or that medication didn't work as well.

## 2018-06-30 ENCOUNTER — Ambulatory Visit: Payer: PRIVATE HEALTH INSURANCE | Admitting: Family Medicine

## 2018-06-30 ENCOUNTER — Emergency Department: Payer: PRIVATE HEALTH INSURANCE

## 2018-06-30 ENCOUNTER — Encounter: Payer: Self-pay | Admitting: Emergency Medicine

## 2018-06-30 ENCOUNTER — Ambulatory Visit: Payer: Self-pay | Admitting: *Deleted

## 2018-06-30 ENCOUNTER — Emergency Department
Admission: EM | Admit: 2018-06-30 | Discharge: 2018-06-30 | Disposition: A | Discharge status: Home / Self Care | Payer: PRIVATE HEALTH INSURANCE | Attending: Emergency Medicine | Admitting: Emergency Medicine

## 2018-06-30 ENCOUNTER — Other Ambulatory Visit: Payer: Self-pay

## 2018-06-30 ENCOUNTER — Emergency Department
Admission: EM | Admit: 2018-06-30 | Discharge: 2018-06-30 | Disposition: A | Discharge status: Home / Self Care | Payer: PRIVATE HEALTH INSURANCE | Source: Home / Self Care | Attending: Emergency Medicine | Admitting: Emergency Medicine

## 2018-06-30 DIAGNOSIS — R079 Chest pain, unspecified: Secondary | ICD-10-CM | POA: Diagnosis present

## 2018-06-30 DIAGNOSIS — F419 Anxiety disorder, unspecified: Secondary | ICD-10-CM

## 2018-06-30 DIAGNOSIS — I10 Essential (primary) hypertension: Secondary | ICD-10-CM

## 2018-06-30 DIAGNOSIS — E876 Hypokalemia: Secondary | ICD-10-CM

## 2018-06-30 DIAGNOSIS — Z79899 Other long term (current) drug therapy: Secondary | ICD-10-CM

## 2018-06-30 DIAGNOSIS — Z5321 Procedure and treatment not carried out due to patient leaving prior to being seen by health care provider: Secondary | ICD-10-CM | POA: Insufficient documentation

## 2018-06-30 LAB — COMPREHENSIVE METABOLIC PANEL
ALT: 20 U/L (ref 0–44)
AST: 25 U/L (ref 15–41)
Albumin: 4 g/dL (ref 3.5–5.0)
Alkaline Phosphatase: 46 U/L (ref 38–126)
Anion gap: 8 (ref 5–15)
BUN: 10 mg/dL (ref 6–20)
CO2: 23 mmol/L (ref 22–32)
Calcium: 8.7 mg/dL — ABNORMAL LOW (ref 8.9–10.3)
Chloride: 107 mmol/L (ref 98–111)
Creatinine, Ser: 0.64 mg/dL (ref 0.44–1.00)
GFR calc Af Amer: >60 mL/min (ref >60)
GFR calc non Af Amer: >60 mL/min (ref >60)
Glucose, Bld: 144 mg/dL — ABNORMAL HIGH (ref 70–99)
Potassium: 2.7 mmol/L — CL (ref 3.5–5.1)
Sodium: 138 mmol/L (ref 135–145)
Total Bilirubin: 0.5 mg/dL (ref 0.3–1.2)
Total Protein: 6.9 g/dL (ref 6.5–8.1)

## 2018-06-30 LAB — CBC WITH DIFFERENTIAL/PLATELET
Abs Immature Granulocytes: 0.04 10*3/uL (ref 0.00–0.07)
Basophils Absolute: 0.1 10*3/uL (ref 0.0–0.1)
Basophils Relative: 1 %
Eosinophils Absolute: 0.2 10*3/uL (ref 0.0–0.5)
Eosinophils Relative: 2 %
HCT: 38 % (ref 36.0–46.0)
Hemoglobin: 13 g/dL (ref 12.0–15.0)
Immature Granulocytes: 0 %
Lymphocytes Relative: 40 %
Lymphs Abs: 4.4 10*3/uL — ABNORMAL HIGH (ref 0.7–4.0)
MCH: 30.7 pg (ref 26.0–34.0)
MCHC: 34.2 g/dL (ref 30.0–36.0)
MCV: 89.6 fL (ref 80.0–100.0)
Monocytes Absolute: 0.7 10*3/uL (ref 0.1–1.0)
Monocytes Relative: 6 %
Neutro Abs: 5.7 10*3/uL (ref 1.7–7.7)
Neutrophils Relative %: 51 %
Platelets: 328 10*3/uL (ref 150–400)
RBC: 4.24 MIL/uL (ref 3.87–5.11)
RDW: 12.7 % (ref 11.5–15.5)
WBC: 11.1 10*3/uL — ABNORMAL HIGH (ref 4.0–10.5)
nRBC: 0 % (ref 0.0–0.2)

## 2018-06-30 LAB — TROPONIN I: Troponin I: <0.03 ng/mL (ref <0.03)

## 2018-06-30 MED ORDER — POTASSIUM CHLORIDE CRYS ER 10 MEQ PO TBCR: 10.0000 meq | EXTENDED_RELEASE_TABLET | Freq: Two times a day (BID) | ORAL | 0 refills | Status: DC

## 2018-06-30 MED ORDER — LORAZEPAM 1 MG PO TABS
1.0000 mg | ORAL_TABLET | Freq: Once | ORAL | Status: AC
  Administered 2018-06-30: 1 mg via ORAL
  Filled 2018-06-30: qty 1

## 2018-06-30 MED ORDER — LORAZEPAM 0.5 MG PO TABS: 0.5000 mg | ORAL_TABLET | Freq: Three times a day (TID) | ORAL | 0 refills | Status: DC | PRN

## 2018-06-30 NOTE — ED Triage Notes (Signed)
Patient ambulatory to triage with steady gait, without difficulty or distress noted; pt reports chest tightness and heart racing x hour; denies hx, denies accomp symptoms

## 2018-06-30 NOTE — Discharge Instructions (Signed)
Please follow-up with your doctor next week for recheck/reevaluation and recheck of your blood work, and potassium level.  Return to the emergency department for any symptoms personally concerning to yourself.
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
## 2018-06-30 NOTE — ED Triage Notes (Signed)
ALLERGIC REACTION, starting approx 1 hour ago.  Recent changes to BP meds  " same med but lower dose" .   Difficulty swallowing / breathing.
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
## 2018-09-18 ENCOUNTER — Encounter: Payer: PRIVATE HEALTH INSURANCE | Admitting: Family Medicine

## 2018-11-21 ENCOUNTER — Other Ambulatory Visit: Payer: Self-pay

## 2018-11-21 ENCOUNTER — Encounter: Payer: Self-pay | Admitting: Family Medicine

## 2018-11-21 ENCOUNTER — Ambulatory Visit (INDEPENDENT_AMBULATORY_CARE_PROVIDER_SITE_OTHER): Payer: PRIVATE HEALTH INSURANCE | Admitting: Family Medicine

## 2018-11-21 VITALS — BP 136/88 | HR 91 | Temp 98.4°F | Ht 61.3 in | Wt 183.0 lb

## 2018-11-21 DIAGNOSIS — Z1239 Encounter for other screening for malignant neoplasm of breast: Secondary | ICD-10-CM

## 2018-11-21 DIAGNOSIS — Z0000 Encounter for general adult medical examination without abnormal findings: Secondary | ICD-10-CM

## 2018-11-21 DIAGNOSIS — F411 Generalized anxiety disorder: Secondary | ICD-10-CM | POA: Diagnosis not present

## 2018-11-21 DIAGNOSIS — I10 Essential (primary) hypertension: Secondary | ICD-10-CM

## 2018-11-21 DIAGNOSIS — Z13220 Encounter for screening for lipoid disorders: Secondary | ICD-10-CM | POA: Diagnosis not present

## 2018-11-21 LAB — UA/M W/RFLX CULTURE, ROUTINE
Bilirubin, UA: NEGATIVE
Glucose, UA: NEGATIVE
Ketones, UA: NEGATIVE
Leukocytes,UA: NEGATIVE
Nitrite, UA: NEGATIVE
Protein,UA: NEGATIVE
Specific Gravity, UA: 1.025 (ref 1.005–1.030)
Urobilinogen, Ur: 0.2 mg/dL (ref 0.2–1.0)
pH, UA: 5.5 (ref 5.0–7.5)

## 2018-11-21 LAB — MICROALBUMIN, URINE WAIVED
Creatinine, Urine Waived: 100 mg/dL (ref 10–300)
Microalb, Ur Waived: 30 mg/L — ABNORMAL HIGH (ref 0–19)
Microalb/Creat Ratio: <30 mg/g (ref <30)

## 2018-11-21 MED ORDER — BUSPIRONE HCL 5 MG PO TABS: 5.0000 mg | ORAL_TABLET | Freq: Two times a day (BID) | ORAL | 1 refills | Status: DC

## 2018-11-21 MED ORDER — METOPROLOL SUCCINATE ER 25 MG PO TB24: 25.0000 mg | ORAL_TABLET | Freq: Every day | ORAL | 1 refills | Status: DC

## 2018-11-21 MED ORDER — LISINOPRIL 10 MG PO TABS: 10.0000 mg | ORAL_TABLET | Freq: Every day | ORAL | 1 refills | Status: DC

## 2018-11-21 MED ORDER — BUPROPION HCL ER (SR) 150 MG PO TB12: 150.0000 mg | ORAL_TABLET | Freq: Two times a day (BID) | ORAL | 1 refills | Status: DC

## 2018-11-21 MED ORDER — LORAZEPAM 0.5 MG PO TABS: 0.5000 mg | ORAL_TABLET | Freq: Every day | ORAL | 0 refills | Status: DC | PRN

## 2018-11-21 NOTE — Patient Instructions (Addendum)
Call for your mammogram: [facility]  Address: [address], [place], [place] [address]  Phone: [phone]   Health Maintenance, Female Adopting a healthy lifestyle and getting preventive care are important in promoting health and wellness. Ask your health care provider about:  The right schedule for you to have regular tests and exams.  Things you can do on your own to prevent diseases and keep yourself healthy. What should I know about diet, weight, and exercise? Eat a healthy diet   Eat a diet that includes plenty of vegetables, fruits, low-fat dairy products, and lean protein.  Do not eat a lot of foods that are high in solid fats, added sugars, or sodium. Maintain a healthy weight Body mass index (BMI) is used to identify weight problems. It estimates body fat based on height and weight. Your health care provider can help determine your BMI and help you achieve or maintain a healthy weight. Get regular exercise Get regular exercise. This is one of the most important things you can do for your health. Most adults should:  Exercise for at least 150 minutes each week. The exercise should increase your heart rate and make you sweat (moderate-intensity exercise).  Do strengthening exercises at least twice a week. This is in addition to the moderate-intensity exercise.  Spend less time sitting. Even light physical activity can be beneficial. Watch cholesterol and blood lipids Have your blood tested for lipids and cholesterol at 54 years of age, then have this test every 5 years. Have your cholesterol levels checked more often if:  Your lipid or cholesterol levels are high.  You are older than 54 years of age.  You are at high risk for heart disease. What should I know about cancer screening? Depending on your health history and family history, you may need to have cancer screening at various ages. This may include screening for:   Breast cancer.  Cervical cancer.  Colorectal cancer.  Skin cancer.  Lung cancer. What should I know about heart disease, diabetes, and high blood pressure? Blood pressure and heart disease  High blood pressure causes heart disease and increases the risk of stroke. This is more likely to develop in people who have high blood pressure readings, are of African descent, or are overweight.  Have your blood pressure checked: ? Every 3-5 years if you are 418-339 years of age. ? Every year if you are 54 years old or older. Diabetes Have regular diabetes screenings. This checks your fasting blood sugar level. Have the screening done:  Once every three years after age 54 if you are at a normal weight and have a low risk for diabetes.  More often and at a younger age if you are overweight or have a high risk for diabetes. What should I know about preventing infection? Hepatitis B If you have a higher risk for hepatitis B, you should be screened for this virus. Talk with your health care provider to find out if you are at risk for hepatitis B infection. Hepatitis C Testing is recommended for:  Everyone born from [id number] through 1966.  Anyone with known risk factors for hepatitis C. Sexually transmitted infections (STIs)  Get screened for STIs, including gonorrhea and chlamydia, if: ? You are sexually active and are younger than 54 years of age. ? You are older than 54 years of age and your health care provider tells you that you are at risk for this type of infection. ?  Your sexual activity has changed since you were last screened, and you are at increased risk for chlamydia or gonorrhea. Ask your health care provider if you are at risk.  Ask your health care provider about whether you are at high risk for HIV. Your health care provider may recommend a prescription medicine to help prevent HIV infection. If you choose to take medicine to prevent HIV, you should first get tested for HIV. You  should then be tested every 3 months for as long as you are taking the medicine. Pregnancy  If you are about to stop having your period (premenopausal) and you may become pregnant, seek counseling before you get pregnant.  Take 400 to 800 micrograms (mcg) of folic acid every day if you become pregnant.  Ask for birth control (contraception) if you want to prevent pregnancy. Osteoporosis and menopause Osteoporosis is a disease in which the bones lose minerals and strength with aging. This can result in bone fractures. If you are 72 years old or older, or if you are at risk for osteoporosis and fractures, ask your health care provider if you should:  Be screened for bone loss.  Take a calcium or vitamin D supplement to lower your risk of fractures.  Be given hormone replacement therapy (HRT) to treat symptoms of menopause. Follow these instructions at home: Lifestyle  Do not use any products that contain nicotine or tobacco, such as cigarettes, e-cigarettes, and chewing tobacco. If you need help quitting, ask your health care provider.  Do not use street drugs.  Do not share needles.  Ask your health care provider for help if you need support or information about quitting drugs. Alcohol use  Do not drink alcohol if: ? Your health care provider tells you not to drink. ? You are pregnant, may be pregnant, or are planning to become pregnant.  If you drink alcohol: ? Limit how much you use to 0-1 drink a day. ? Limit intake if you are breastfeeding.  Be aware of how much alcohol is in your drink. In the [place] one drink equals one 12 oz bottle of beer (355 mL), one 5 oz glass of wine (148 mL), or one 1 oz glass of hard liquor (44 mL). General instructions  Schedule regular health, dental, and eye exams.  Stay current with your vaccines.  Tell your health care provider if: ? You often feel depressed. ? You have ever been abused or do not feel safe at home. Summary  Adopting a  healthy lifestyle and getting preventive care are important in promoting health and wellness.  Follow your health care provider's instructions about healthy diet, exercising, and getting tested or screened for diseases.  Follow your health care provider's instructions on monitoring your cholesterol and blood pressure. This information is not intended to replace advice given to you by your health care provider. Make sure you discuss any questions you have with your health care provider. Document Released: 11/23/2010 Document Revised: 05/03/2018 Document Reviewed: 05/03/2018 Elsevier Patient Education  [place].  Tinnitus Tinnitus refers to hearing a sound when there is no actual source for that sound. This is often described as ringing in the ears. However, people with this condition may hear a variety of noises, in one ear or in both ears. The sounds of tinnitus can be soft, loud, or somewhere in between. Tinnitus can last for a few seconds or can be constant for days. It may go away without treatment and come back at various  times. When tinnitus is constant or happens often, it can lead to other problems, such as trouble sleeping and trouble concentrating. Almost everyone experiences tinnitus at some point. Tinnitus that is long-lasting (chronic) or comes back often (recurs) may require medical attention. What are the causes? The cause of tinnitus is often not known. In some cases, it can result from other problems or conditions, including:  Exposure to loud noises from machinery, music, or other sources.  Hearing loss.  Ear or sinus infections.  Earwax buildup.  An object (foreign body) stuck in the ear.  Taking certain medicines.  Drinking alcohol or caffeine.  High blood pressure.  Heart diseases.  Anemia.  Allergies.  Meniere's disease.  Thyroid problems.  Tumors.  A weak, bulging blood vessel (aneurysm) near the ear.  Depression or other mood disorders.  What are the signs or symptoms? The main symptom of tinnitus is hearing a sound when there is no source for that sound. It may sound like:  Buzzing.  Roaring.  Ringing.  Blowing air, like the sound heard when you listen to a seashell.  Hissing.  Whistling.  Sizzling.  Humming.  Running water.  A musical note.  Tapping. Symptoms may affect only one ear (unilateral) or both ears (bilateral). How is this diagnosed? Tinnitus is diagnosed based on your symptoms, your medical history, and a physical exam. Your health care provider may do a thorough hearing test (audiologic exam) if your tinnitus:  Is unilateral.  Causes hearing difficulties.  Lasts 6 months or longer. You may work with a health care provider who specializes in hearing disorders (audiologist). You may be asked questions about your symptoms and how they affect your daily life. You may have other tests done, such as:  CT scan.  MRI.  An imaging test of how blood flows through your blood vessels (angiogram). How is this treated? Treating an underlying medical condition can sometimes make tinnitus go away. If your tinnitus continues, other treatments may include:  Medicines, such as antidepressants or sleeping aids.  Sound generators to mask the tinnitus. These include: ? Tabletop sound machines that play relaxing sounds to help you fall asleep. ? Wearable devices that fit in your ear and play sounds or music. ? Acoustic neural stimulation. This involves using headphones to listen to music that contains an auditory signal. Over time, listening to this signal may change some pathways in your brain and make you less sensitive to tinnitus. This treatment is used for very severe cases when no other treatment is working.  Therapy and counseling to help you manage the stress of living with tinnitus.  Using hearing aids or cochlear implants if your tinnitus is related to hearing loss. Hearing aids are worn in the  outer ear. Cochlear implants are surgically placed in the inner ear. Follow these instructions at home: Managing symptoms      When possible, avoid being in loud places and being exposed to loud sounds.  Wear hearing protection, such as earplugs, when you are exposed to loud noises.  Use a white noise machine, a humidifier, or other devices to mask the sound of tinnitus.  Practice techniques for reducing stress, such as meditation, yoga, or deep breathing. Work with your health care provider if you need help with managing stress.  Sleep with your head slightly raised. This may reduce the impact of tinnitus. General instructions  Do not use stimulants, such as nicotine, alcohol, or caffeine. Talk with your health care provider about other stimulants to  avoid. Stimulants are substances that can make you feel alert and attentive by increasing certain activities in the body (such as heart rate and blood pressure). These substances may make tinnitus worse.  Take over-the-counter and prescription medicines only as told by your health care provider.  Try to get plenty of sleep each night.  Keep all follow-up visits as told by your health care provider. This is important. Contact a health care provider if:  Your tinnitus continues for 3 weeks or longer without stopping.  Your symptoms get worse or do not get better with home care.  You develop tinnitus after a head injury.  You have tinnitus along with any of the following: ? Dizziness. ? Loss of balance. ? Nausea and vomiting. Summary  Tinnitus refers to hearing a sound when there is no actual source for that sound. This is often described as ringing in the ears.  Symptoms may affect only one ear (unilateral) or both ears (bilateral).  Use a white noise machine, a humidifier, or other devices to mask the sound of tinnitus.  Do not use stimulants, such as nicotine, alcohol, or caffeine. Talk with your health care provider about  other stimulants to avoid. These substances may make tinnitus worse. This information is not intended to replace advice given to you by your health care provider. Make sure you discuss any questions you have with your health care provider. Document Released: 05/10/2005 Document Revised: 04/22/2017 Document Reviewed: 02/17/2017 Elsevier Patient Education  2020 [employer]

## 2018-11-21 NOTE — Assessment & Plan Note (Addendum)
Exacerbated due to COVID-19 pandemic. Had panic attack in February and would like to continue lorazepam. Discussion that we will continue it, but only to take for really bad times as it can be addictive. Continue other medications. Continue to monitor. 30 pills should last at least 3-6 months. Call with any concerns.

## 2018-11-21 NOTE — Assessment & Plan Note (Signed)
Under good control on current regimen. Continue current regimen. Continue to monitor. Call with any concerns. Refills given. Labs drawn today.   
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
## 2018-11-23 LAB — COMPREHENSIVE METABOLIC PANEL
ALT: 26 IU/L (ref 0–32)
AST: 44 IU/L — ABNORMAL HIGH (ref 0–40)
Albumin/Globulin Ratio: 2 (ref 1.2–2.2)
Albumin: 4.3 g/dL (ref 3.8–4.9)
Alkaline Phosphatase: 57 IU/L (ref 39–117)
BUN/Creatinine Ratio: 22 (ref 9–23)
BUN: 13 mg/dL (ref 6–24)
Bilirubin Total: <0.2 mg/dL (ref 0.0–1.2)
CO2: 20 mmol/L (ref 20–29)
Calcium: 9 mg/dL (ref 8.7–10.2)
Chloride: 104 mmol/L (ref 96–106)
Creatinine, Ser: 0.6 mg/dL (ref 0.57–1.00)
GFR calc Af Amer: 120 mL/min/{1.73_m2} (ref >59)
GFR calc non Af Amer: 104 mL/min/{1.73_m2} (ref >59)
Globulin, Total: 2.1 g/dL (ref 1.5–4.5)
Glucose: 86 mg/dL (ref 65–99)
Potassium: 4.1 mmol/L (ref 3.5–5.2)
Sodium: 140 mmol/L (ref 134–144)
Total Protein: 6.4 g/dL (ref 6.0–8.5)

## 2018-11-23 LAB — CBC WITH DIFFERENTIAL/PLATELET
Basophils Absolute: 0 10*3/uL (ref 0.0–0.2)
Basos: 0 %
EOS (ABSOLUTE): 0.1 10*3/uL (ref 0.0–0.4)
Eos: 1 %
Hematocrit: 36.3 % (ref 34.0–46.6)
Hemoglobin: 12.7 g/dL (ref 11.1–15.9)
Immature Grans (Abs): 0 10*3/uL (ref 0.0–0.1)
Immature Granulocytes: 0 %
Lymphocytes Absolute: 2.9 10*3/uL (ref 0.7–3.1)
Lymphs: 28 %
MCH: 30.5 pg (ref 26.6–33.0)
MCHC: 35 g/dL (ref 31.5–35.7)
MCV: 87 fL (ref 79–97)
Monocytes Absolute: 0.6 10*3/uL (ref 0.1–0.9)
Monocytes: 6 %
Neutrophils Absolute: 6.7 10*3/uL (ref 1.4–7.0)
Neutrophils: 65 %
Platelets: 341 10*3/uL (ref 150–450)
RBC: 4.16 x10E6/uL (ref 3.77–5.28)
RDW: 12.5 % (ref 11.7–15.4)
WBC: 10.3 10*3/uL (ref 3.4–10.8)

## 2018-11-23 LAB — LIPID PANEL W/O CHOL/HDL RATIO
Cholesterol, Total: 301 mg/dL — ABNORMAL HIGH (ref 100–199)
HDL: 55 mg/dL (ref >39)
LDL Calculated: 194 mg/dL — ABNORMAL HIGH (ref 0–99)
Triglycerides: 260 mg/dL — ABNORMAL HIGH (ref 0–149)
VLDL Cholesterol Cal: 52 mg/dL — ABNORMAL HIGH (ref 5–40)

## 2018-11-23 LAB — TSH: TSH: 1.34 u[IU]/mL (ref 0.450–4.500)

## 2018-11-23 LAB — HIV ANTIBODY (ROUTINE TESTING W REFLEX): HIV Screen 4th Generation wRfx: NONREACTIVE

## 2018-11-27 ENCOUNTER — Telehealth: Payer: Self-pay | Admitting: Family Medicine

## 2018-11-27 MED ORDER — ATORVASTATIN CALCIUM 40 MG PO TABS: 40.0000 mg | ORAL_TABLET | Freq: Every day | ORAL | 3 refills | Status: DC

## 2018-11-27 NOTE — Telephone Encounter (Signed)
Patient returned my call and was notified of results and medication. Lab appointment scheduled for 1 month as well. Routing back to provider for order.

## 2018-11-27 NOTE — Telephone Encounter (Signed)
Called and left patient a VM asking for her to please return my call.  

## 2018-11-27 NOTE — Telephone Encounter (Signed)
Please let her know that her labs look good in general, but her cholesterol got worse. I'd like to start her on some medication (atorvastatin 40) and recheck in 1 month. Everything else looks good. Thanks!

## 2018-12-25 ENCOUNTER — Other Ambulatory Visit: Payer: PRIVATE HEALTH INSURANCE

## 2018-12-25 ENCOUNTER — Other Ambulatory Visit: Payer: Self-pay

## 2018-12-25 DIAGNOSIS — E7800 Pure hypercholesterolemia, unspecified: Secondary | ICD-10-CM

## 2018-12-26 LAB — LIPID PANEL W/O CHOL/HDL RATIO
Cholesterol, Total: 225 mg/dL — ABNORMAL HIGH (ref 100–199)
HDL: 60 mg/dL (ref >39)
LDL Calculated: 117 mg/dL — ABNORMAL HIGH (ref 0–99)
Triglycerides: 240 mg/dL — ABNORMAL HIGH (ref 0–149)
VLDL Cholesterol Cal: 48 mg/dL — ABNORMAL HIGH (ref 5–40)

## 2018-12-29 ENCOUNTER — Encounter: Payer: Self-pay | Admitting: Family Medicine

## 2019-01-18 ENCOUNTER — Other Ambulatory Visit: Payer: Self-pay | Admitting: Family Medicine

## 2019-01-20 ENCOUNTER — Other Ambulatory Visit: Payer: Self-pay | Admitting: Family Medicine

## 2019-03-05 ENCOUNTER — Ambulatory Visit (INDEPENDENT_AMBULATORY_CARE_PROVIDER_SITE_OTHER): Payer: PRIVATE HEALTH INSURANCE

## 2019-03-05 ENCOUNTER — Other Ambulatory Visit: Payer: Self-pay

## 2019-03-05 DIAGNOSIS — Z23 Encounter for immunization: Secondary | ICD-10-CM

## 2019-05-29 ENCOUNTER — Ambulatory Visit: Payer: PRIVATE HEALTH INSURANCE | Admitting: Family Medicine

## 2019-05-30 ENCOUNTER — Other Ambulatory Visit: Payer: Self-pay | Admitting: Orthopedic Surgery

## 2019-05-30 DIAGNOSIS — M5442 Lumbago with sciatica, left side: Secondary | ICD-10-CM

## 2019-05-30 DIAGNOSIS — M5416 Radiculopathy, lumbar region: Secondary | ICD-10-CM

## 2019-05-30 DIAGNOSIS — M4807 Spinal stenosis, lumbosacral region: Secondary | ICD-10-CM

## 2019-06-03 ENCOUNTER — Other Ambulatory Visit: Payer: Self-pay | Admitting: Family Medicine

## 2019-06-08 ENCOUNTER — Ambulatory Visit
Admission: RE | Admit: 2019-06-08 | Discharge: 2019-06-08 | Disposition: A | Discharge status: Home / Self Care | Payer: 59 | Source: Ambulatory Visit | Attending: Orthopedic Surgery | Admitting: Orthopedic Surgery

## 2019-06-08 ENCOUNTER — Other Ambulatory Visit: Payer: Self-pay

## 2019-06-08 DIAGNOSIS — M5416 Radiculopathy, lumbar region: Secondary | ICD-10-CM | POA: Diagnosis present

## 2019-06-08 DIAGNOSIS — M5442 Lumbago with sciatica, left side: Secondary | ICD-10-CM | POA: Diagnosis not present

## 2019-06-08 DIAGNOSIS — M4807 Spinal stenosis, lumbosacral region: Secondary | ICD-10-CM | POA: Diagnosis present

## 2019-08-03 ENCOUNTER — Other Ambulatory Visit: Payer: Self-pay | Admitting: Family Medicine

## 2019-11-29 ENCOUNTER — Other Ambulatory Visit: Payer: Self-pay | Admitting: Family Medicine

## 2019-11-29 NOTE — Telephone Encounter (Signed)
Routing to provider  

## 2019-11-29 NOTE — Telephone Encounter (Signed)
Requested  medications are  due for refill today yes  Requested medications are on the active medication list yes  Last refill 4/11  Last visit June 2020  Future visit scheduled no  Notes to clinic Failed protocol of visit within 12 months

## 2019-11-29 NOTE — Telephone Encounter (Signed)
Unable to lvm to make this apt/  

## 2019-11-30 NOTE — Telephone Encounter (Signed)
LVM for pt to call back.

## 2019-12-05 ENCOUNTER — Encounter: Payer: Self-pay | Admitting: Family Medicine

## 2019-12-05 NOTE — Telephone Encounter (Signed)
Unable to reach pt by phone. Mailing letter to schedule appt.  Forwarding as Lorain Childes

## 2019-12-17 ENCOUNTER — Other Ambulatory Visit: Payer: Self-pay

## 2019-12-17 ENCOUNTER — Ambulatory Visit (INDEPENDENT_AMBULATORY_CARE_PROVIDER_SITE_OTHER): Payer: 59 | Admitting: Family Medicine

## 2019-12-17 ENCOUNTER — Encounter: Payer: Self-pay | Admitting: Family Medicine

## 2019-12-17 VITALS — BP 150/100 | HR 93 | Temp 98.3°F | Wt 184.0 lb

## 2019-12-17 DIAGNOSIS — M5442 Lumbago with sciatica, left side: Secondary | ICD-10-CM

## 2019-12-17 DIAGNOSIS — F411 Generalized anxiety disorder: Secondary | ICD-10-CM | POA: Diagnosis not present

## 2019-12-17 DIAGNOSIS — I10 Essential (primary) hypertension: Secondary | ICD-10-CM

## 2019-12-17 DIAGNOSIS — N819 Female genital prolapse, unspecified: Secondary | ICD-10-CM

## 2019-12-17 DIAGNOSIS — E782 Mixed hyperlipidemia: Secondary | ICD-10-CM

## 2019-12-17 DIAGNOSIS — G8929 Other chronic pain: Secondary | ICD-10-CM

## 2019-12-17 MED ORDER — BUPROPION HCL ER (SR) 150 MG PO TB12: 150.0000 mg | ORAL_TABLET | Freq: Two times a day (BID) | ORAL | 1 refills | Status: DC

## 2019-12-17 MED ORDER — LORAZEPAM 0.5 MG PO TABS: 0.5000 mg | ORAL_TABLET | Freq: Every day | ORAL | 0 refills | Status: DC | PRN

## 2019-12-17 MED ORDER — ATORVASTATIN CALCIUM 40 MG PO TABS: ORAL_TABLET | ORAL | 1 refills | Status: DC

## 2019-12-17 MED ORDER — METOPROLOL SUCCINATE ER 25 MG PO TB24: 25.0000 mg | ORAL_TABLET | Freq: Every day | ORAL | 1 refills | Status: DC

## 2019-12-17 MED ORDER — LISINOPRIL 10 MG PO TABS: ORAL_TABLET | ORAL | 1 refills | Status: DC

## 2019-12-17 NOTE — Assessment & Plan Note (Signed)
Has not been taking her medicine. Will restart her medicine and recheck 1 month with labs.

## 2019-12-17 NOTE — Assessment & Plan Note (Signed)
Has not been taking her medicine. Will restart her medicine and recheck 1 month with labs.  

## 2019-12-17 NOTE — Assessment & Plan Note (Signed)
Offered referral to PT. She would like to hold off. Continue to follow with pain management.

## 2019-12-17 NOTE — Progress Notes (Signed)
BP (!) 150/100 (BP Location: Left Arm, Patient Position: Sitting, Cuff Size: Normal)   Pulse 93   Temp 98.3 F (36.8 C) (Oral)   Wt 184 lb (83.5 kg)   SpO2 96%   BMI 34.43 kg/m    Subjective:    Patient ID: Lindsey Drake, female    DOB: Oct 23, 1965, 54 y.o.   MRN: [id number]  HPI: Lindsey Drake is a 54 y.o. female  Chief Complaint  Patient presents with  . Blood Pressure Check  . vaginal pressure  . Pain    waist down   Has been noticing some issues with pressure in her vaginal area. Incomplete bladder emptying. She has noticed a "soft little lump" in her vagina  HYPERTENSION / HYPERLIPIDEMIA Satisfied with current treatment? no Duration of hypertension: chronic BP monitoring frequency: not checking BP medication side effects: no Past BP meds: hasn't been taking her meds Duration of hyperlipidemia: chronic Cholesterol medication side effects: hasn't been taking her medicine Cholesterol supplements: none Past cholesterol medications: atorvastatin Medication compliance: poor compliance Aspirin: no Recent stressors: yes Recurrent headaches: no Visual changes: no Palpitations: no Dyspnea: no Chest pain: no Lower extremity edema: no Dizzy/lightheaded: no  ANXIETY/STRESS Duration: chronic Status:uncontrolled Anxious mood: yes  Excessive worrying: yes Irritability: no  Sweating: no Nausea: no Palpitations:no Hyperventilation: no Panic attacks: no Agoraphobia: no  Obscessions/compulsions: no Depressed mood: yes Depression screen [facility] 2/9 12/17/2019 11/21/2018 06/15/2018 03/20/2018 02/14/2018  Decreased Interest 3 3 1 1 3   Down, Depressed, Hopeless 3 3 1  0 1  PHQ - 2 Score 6 6 2 1 4   Altered sleeping 3 3 3 3 3   Tired, decreased energy 3 3 1 1 3   Change in appetite 1 2 0 2 2  Feeling bad or failure about yourself  0 0 0 0 0  Trouble concentrating 2 2 0 0 0  Moving slowly or fidgety/restless 0 0 0 0 0  Suicidal thoughts 0 0 0 0 0  PHQ-9 Score 15 16 6 7 12     Difficult doing work/chores Somewhat difficult Somewhat difficult Somewhat difficult Somewhat difficult Somewhat difficult  Some recent data might be hidden   Anhedonia: no Weight changes: no Insomnia: yes hard to fall asleep  Hypersomnia: no Fatigue/loss of energy: yes Feelings of worthlessness: yes Feelings of guilt: no Impaired concentration/indecisiveness: no Suicidal ideations: no  Crying spells: no Recent Stressors/Life Changes: yes   Relationship problems: no   Family stress: no     Financial stress: no    Job stress: no    Recent death/loss: no   Relevant past medical, surgical, family and social history reviewed and updated as indicated. Interim medical history since our last visit reviewed. Allergies and medications reviewed and updated.  Review of Systems  Constitutional: Negative.   Respiratory: Negative.   Cardiovascular: Negative.   Gastrointestinal: Negative.   Musculoskeletal: Positive for back pain and myalgias. Negative for arthralgias, gait problem, joint swelling, neck pain and neck stiffness.  Skin: Negative.   Neurological: Negative.   Psychiatric/Behavioral: Positive for sleep disturbance. Negative for agitation, behavioral problems, confusion, decreased concentration, dysphoric mood, hallucinations, self-injury and suicidal ideas. The patient is nervous/anxious. The patient is not hyperactive.     Per HPI unless specifically indicated above     Objective:    BP (!) 150/100 (BP Location: Left Arm, Patient Position: Sitting, Cuff Size: Normal)   Pulse 93   Temp 98.3 F (36.8 C) (Oral)   Wt 184 lb (83.5 kg)  SpO2 96%   BMI 34.43 kg/m   Wt Readings from Last 3 Encounters:  12/17/19 184 lb (83.5 kg)  11/21/18 183 lb (83 kg)  06/30/18 185 lb (83.9 kg)    Physical Exam Vitals and nursing note reviewed.  Constitutional:      General: She is not in acute distress.    Appearance: Normal appearance. She is not ill-appearing, toxic-appearing or  diaphoretic.  HENT:     Head: Normocephalic and atraumatic.     Right Ear: External ear normal.     Left Ear: External ear normal.     Nose: Nose normal.     Mouth/Throat:     Mouth: Mucous membranes are moist.     Pharynx: Oropharynx is clear.  Eyes:     General: No scleral icterus.       Right eye: No discharge.        Left eye: No discharge.     Extraocular Movements: Extraocular movements intact.     Conjunctiva/sclera: Conjunctivae normal.     Pupils: Pupils are equal, round, and reactive to light.  Cardiovascular:     Rate and Rhythm: Normal rate and regular rhythm.     Pulses: Normal pulses.     Heart sounds: Normal heart sounds. No murmur heard.  No friction rub. No gallop.   Pulmonary:     Effort: Pulmonary effort is normal. No respiratory distress.     Breath sounds: Normal breath sounds. No stridor. No wheezing, rhonchi or rales.  Chest:     Chest wall: No tenderness.  Musculoskeletal:        General: Normal range of motion.     Cervical back: Normal range of motion and neck supple.  Skin:    General: Skin is warm and dry.     Capillary Refill: Capillary refill takes less than 2 seconds.     Coloration: Skin is not jaundiced or pale.     Findings: No bruising, erythema, lesion or rash.  Neurological:     General: No focal deficit present.     Mental Status: She is alert and oriented to person, place, and time. Mental status is at baseline.  Psychiatric:        Mood and Affect: Mood normal.        Behavior: Behavior normal.        Thought Content: Thought content normal.        Judgment: Judgment normal.     Results for orders placed or performed in visit on 12/25/18  Lipid Panel w/o Chol/HDL Ratio  Result Value Ref Range   Cholesterol, Total 225 (H) 100 - 199 mg/dL   Triglycerides 837 (H) 0 - 149 mg/dL   HDL 60 >29 mg/dL   VLDL Cholesterol Cal 48 (H) 5 - 40 mg/dL   LDL Calculated 021 (H) 0 - 99 mg/dL      Assessment & Plan:   Problem List Items  Addressed This Visit      Cardiovascular and Mediastinum   Essential hypertension, benign    Has not been taking her medicine. Will restart her medicine and recheck 1 month with labs.       Relevant Medications   atorvastatin (LIPITOR) 40 MG tablet   lisinopril (ZESTRIL) 10 MG tablet   metoprolol succinate (TOPROL-XL) 25 MG 24 hr tablet     Nervous and Auditory   Chronic low back pain (Secondary Area of Pain) (Bilateral) (L>R) - Primary (Chronic)    Offered referral to PT.  She would like to hold off. Continue to follow with pain management.      Relevant Medications   methocarbamol (ROBAXIN) 500 MG tablet   buPROPion (WELLBUTRIN SR) 150 MG 12 hr tablet   LORazepam (ATIVAN) 0.5 MG tablet   Other Relevant Orders   UA/M w/rflx Culture, Routine     Other   Anxiety disorder    Has not been taking her medicine. Will restart her medicine and recheck 1 month with labs.       Relevant Medications   buPROPion (WELLBUTRIN SR) 150 MG 12 hr tablet   LORazepam (ATIVAN) 0.5 MG tablet   Mixed hyperlipidemia    Has not been taking her medicine. Will restart her medicine and recheck 1 month with labs.       Relevant Medications   atorvastatin (LIPITOR) 40 MG tablet   lisinopril (ZESTRIL) 10 MG tablet   metoprolol succinate (TOPROL-XL) 25 MG 24 hr tablet    Other Visit Diagnoses    Female genital prolapse, unspecified type       Referral to GYN made today.    Relevant Orders   Ambulatory referral to Obstetrics / Gynecology       Follow up plan: Return in about 4 weeks (around 01/14/2020) for BP and mood.

## 2019-12-18 LAB — UA/M W/RFLX CULTURE, ROUTINE
Bilirubin, UA: NEGATIVE
Glucose, UA: NEGATIVE
Ketones, UA: NEGATIVE
Leukocytes,UA: NEGATIVE
Nitrite, UA: NEGATIVE
Protein,UA: NEGATIVE
Specific Gravity, UA: 1.02 (ref 1.005–1.030)
Urobilinogen, Ur: 0.2 mg/dL (ref 0.2–1.0)
pH, UA: 7 (ref 5.0–7.5)

## 2019-12-18 LAB — MICROSCOPIC EXAMINATION
Bacteria, UA: NONE SEEN
WBC, UA: NONE SEEN /hpf (ref 0–5)

## 2020-01-09 ENCOUNTER — Encounter: Payer: Self-pay | Admitting: Obstetrics and Gynecology

## 2020-01-09 ENCOUNTER — Other Ambulatory Visit: Payer: Self-pay

## 2020-01-09 ENCOUNTER — Ambulatory Visit (INDEPENDENT_AMBULATORY_CARE_PROVIDER_SITE_OTHER): Payer: 59 | Admitting: Obstetrics and Gynecology

## 2020-01-09 VITALS — BP 148/105 | HR 120 | Ht 62.0 in | Wt 181.2 lb

## 2020-01-09 DIAGNOSIS — N393 Stress incontinence (female) (male): Secondary | ICD-10-CM | POA: Diagnosis not present

## 2020-01-09 DIAGNOSIS — N8189 Other female genital prolapse: Secondary | ICD-10-CM
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
## 2020-01-14 ENCOUNTER — Encounter: Payer: BLUE CROSS/BLUE SHIELD | Admitting: Obstetrics and Gynecology

## 2020-01-17 ENCOUNTER — Ambulatory Visit: Payer: Self-pay | Admitting: Family Medicine

## 2020-01-24 ENCOUNTER — Ambulatory Visit (INDEPENDENT_AMBULATORY_CARE_PROVIDER_SITE_OTHER): Payer: 59 | Admitting: Family Medicine

## 2020-01-24 ENCOUNTER — Encounter: Payer: Self-pay | Admitting: Family Medicine

## 2020-01-24 ENCOUNTER — Other Ambulatory Visit: Payer: Self-pay

## 2020-01-24 VITALS — BP 138/92 | HR 87 | Temp 97.8°F | Wt 183.2 lb

## 2020-01-24 DIAGNOSIS — Z23 Encounter for immunization: Secondary | ICD-10-CM | POA: Diagnosis not present

## 2020-01-24 DIAGNOSIS — I10 Essential (primary) hypertension: Secondary | ICD-10-CM

## 2020-01-24 DIAGNOSIS — F411 Generalized anxiety disorder: Secondary | ICD-10-CM

## 2020-01-24 DIAGNOSIS — E782 Mixed hyperlipidemia: Secondary | ICD-10-CM | POA: Diagnosis not present

## 2020-01-24 MED ORDER — BUPROPION HCL ER (XL) 300 MG PO TB24: 300.0000 mg | ORAL_TABLET | Freq: Every day | ORAL | 1 refills | Status: DC

## 2020-01-24 MED ORDER — ESCITALOPRAM OXALATE 10 MG PO TABS: ORAL_TABLET | ORAL | 3 refills | Status: DC

## 2020-01-24 NOTE — Progress Notes (Signed)
BP (!) 138/92 (BP Location: Left Arm, Patient Position: Sitting, Cuff Size: Normal)   Pulse 87   Temp 97.8 F (36.6 C) (Oral)   Wt 183 lb 3.2 oz (83.1 kg)   SpO2 95%   BMI 33.51 kg/m    Subjective:    Patient ID: Lindsey Drake, female    DOB: 11/24/1965, 54 y.o.   MRN: [id number]  HPI: Lindsey Drake is a 54 y.o. female  Chief Complaint  Patient presents with  . Anxiety  . Blood Pressure Check  . Hyperlipidemia   HYPERTENSION / HYPERLIPIDEMIA Satisfied with current treatment? no Duration of hypertension: chronic BP monitoring frequency: not checking BP medication side effects: no Duration of hyperlipidemia: chronic Cholesterol medication side effects: no Cholesterol supplements: none Past cholesterol medications: atorvastatin Medication compliance: excellent compliance Aspirin: no Recent stressors: yes Recurrent headaches: no Visual changes: no Palpitations: no Dyspnea: no Chest pain: no Lower extremity edema: no Dizzy/lightheaded: no  ANXIETY/DEPRESSION- Worse.  Duration:exacerbated Anxious mood: yes   Excessive worrying: yes Irritability: yes  Sweating: no Nausea: no Palpitations:no Hyperventilation: no Panic attacks: yes Agoraphobia: no  Obscessions/compulsions: no Depressed mood: yes Depression screen [facility] 2/9 12/17/2019 11/21/2018 06/15/2018 03/20/2018 02/14/2018  Decreased Interest 3 3 1 1 3   Down, Depressed, Hopeless 3 3 1  0 1  PHQ - 2 Score 6 6 2 1 4   Altered sleeping 3 3 3 3 3   Tired, decreased energy 3 3 1 1 3   Change in appetite 1 2 0 2 2  Feeling bad or failure about yourself  0 0 0 0 0  Trouble concentrating 2 2 0 0 0  Moving slowly or fidgety/restless 0 0 0 0 0  Suicidal thoughts 0 0 0 0 0  PHQ-9 Score 15 16 6 7 12   Difficult doing work/chores Somewhat difficult Somewhat difficult Somewhat difficult Somewhat difficult Somewhat difficult  Some recent data might be hidden   Anhedonia: no Weight changes: no Insomnia: yes   Hypersomnia:  yes Fatigue/loss of energy: yes Feelings of worthlessness: yes Feelings of guilt: yes Impaired concentration/indecisiveness: yes Suicidal ideations: no  Crying spells: yes Recent Stressors/Life Changes: yes   Relationship problems: no   Family stress: yes     Financial stress: yes    Job stress: no    Recent death/loss: no   Relevant past medical, surgical, family and social history reviewed and updated as indicated. Interim medical history since our last visit reviewed. Allergies and medications reviewed and updated.  Review of Systems  Constitutional: Negative.   Respiratory: Negative.   Cardiovascular: Negative.   Gastrointestinal: Negative.   Musculoskeletal: Negative.   Skin: Negative.   Neurological: Negative.   Psychiatric/Behavioral: Positive for dysphoric mood. Negative for agitation, behavioral problems, confusion, decreased concentration, hallucinations, self-injury, sleep disturbance and suicidal ideas. The patient is nervous/anxious. The patient is not hyperactive.     Per HPI unless specifically indicated above     Objective:    BP (!) 138/92 (BP Location: Left Arm, Patient Position: Sitting, Cuff Size: Normal)   Pulse 87   Temp 97.8 F (36.6 C) (Oral)   Wt 183 lb 3.2 oz (83.1 kg)   SpO2 95%   BMI 33.51 kg/m   Wt Readings from Last 3 Encounters:  01/24/20 183 lb 3.2 oz (83.1 kg)  01/09/20 181 lb 3.2 oz (82.2 kg)  12/17/19 184 lb (83.5 kg)    Physical Exam Vitals and nursing note reviewed.  Constitutional:      General: She is not  in acute distress.    Appearance: Normal appearance. She is not ill-appearing, toxic-appearing or diaphoretic.  HENT:     Head: Normocephalic and atraumatic.     Right Ear: External ear normal.     Left Ear: External ear normal.     Nose: Nose normal.     Mouth/Throat:     Mouth: Mucous membranes are moist.     Pharynx: Oropharynx is clear.  Eyes:     General: No scleral icterus.       Right eye: No discharge.         Left eye: No discharge.     Extraocular Movements: Extraocular movements intact.     Conjunctiva/sclera: Conjunctivae normal.     Pupils: Pupils are equal, round, and reactive to light.  Cardiovascular:     Rate and Rhythm: Normal rate and regular rhythm.     Pulses: Normal pulses.     Heart sounds: Normal heart sounds. No murmur heard.  No friction rub. No gallop.   Pulmonary:     Effort: Pulmonary effort is normal. No respiratory distress.     Breath sounds: Normal breath sounds. No stridor. No wheezing, rhonchi or rales.  Chest:     Chest wall: No tenderness.  Musculoskeletal:        General: Normal range of motion.     Cervical back: Normal range of motion and neck supple.  Skin:    General: Skin is warm and dry.     Capillary Refill: Capillary refill takes less than 2 seconds.     Coloration: Skin is not jaundiced or pale.     Findings: No bruising, erythema, lesion or rash.  Neurological:     General: No focal deficit present.     Mental Status: She is alert and oriented to person, place, and time. Mental status is at baseline.  Psychiatric:        Mood and Affect: Mood normal.        Behavior: Behavior normal.        Thought Content: Thought content normal.        Judgment: Judgment normal.     Results for orders placed or performed in visit on 01/24/20  CBC with Differential/Platelet  Result Value Ref Range   WBC 10.6 3.4 - 10.8 x10E3/uL   RBC 4.40 3.77 - 5.28 x10E6/uL   Hemoglobin 13.4 11.1 - 15.9 g/dL   Hematocrit 67.5 91.6 - 46.6 %   MCV 87 79 - 97 fL   MCH 30.5 26.6 - 33.0 pg   MCHC 35.0 31 - 35 g/dL   RDW 38.4 66.5 - 99.3 %   Platelets 312 150 - 450 x10E3/uL   Neutrophils 59 Not Estab. %   Lymphs 33 Not Estab. %   Monocytes 6 Not Estab. %   Eos 1 Not Estab. %   Basos 1 Not Estab. %   Neutrophils Absolute 6.2 1 - 7 x10E3/uL   Lymphocytes Absolute 3.5 (H) 0 - 3 x10E3/uL   Monocytes Absolute 0.6 0 - 0 x10E3/uL   EOS (ABSOLUTE) 0.2 0.0 - 0.4 x10E3/uL    Basophils Absolute 0.1 0 - 0 x10E3/uL   Immature Granulocytes 0 Not Estab. %   Immature Grans (Abs) 0.0 0.0 - 0.1 x10E3/uL  Comprehensive metabolic panel  Result Value Ref Range   Glucose 90 65 - 99 mg/dL   BUN 15 6 - 24 mg/dL   Creatinine, Ser 5.70 0.57 - 1.00 mg/dL   GFR calc non Af  Amer 84 >59 mL/min/1.73   GFR calc Af Amer 97 >59 mL/min/1.73   BUN/Creatinine Ratio 19 9 - 23   Sodium 139 134 - 144 mmol/L   Potassium 4.2 3.5 - 5.2 mmol/L   Chloride 103 96 - 106 mmol/L   CO2 25 20 - 29 mmol/L   Calcium 9.1 8.7 - 10.2 mg/dL   Total Protein 6.8 6.0 - 8.5 g/dL   Albumin 4.2 3.8 - 4.9 g/dL   Globulin, Total 2.6 1.5 - 4.5 g/dL   Albumin/Globulin Ratio 1.6 1.2 - 2.2   Bilirubin Total 0.2 0.0 - 1.2 mg/dL   Alkaline Phosphatase 89 48 - 121 IU/L   AST 16 0 - 40 IU/L   ALT 17 0 - 32 IU/L  Lipid Panel w/o Chol/HDL Ratio  Result Value Ref Range   Cholesterol, Total 206 (H) 100 - 199 mg/dL   Triglycerides 993 (H) 0 - 149 mg/dL   HDL 68 >71 mg/dL   VLDL Cholesterol Cal 34 5 - 40 mg/dL   LDL Chol Calc (NIH) 696 (H) 0 - 99 mg/dL      Assessment & Plan:   Problem List Items Addressed This Visit      Cardiovascular and Mediastinum   Essential hypertension, benign - Primary    Doing much better. Continue current regimen. Continue to monitor. Call with any concerns.       Relevant Orders   CBC with Differential/Platelet (Completed)   Comprehensive metabolic panel (Completed)     Other   Anxiety disorder    Still not under good control. Will add lexapro. Continue wellbutrin and PRN lorazepam. Call with any concerns. Recheck 1 month.       Relevant Medications   escitalopram (LEXAPRO) 10 MG tablet   buPROPion (WELLBUTRIN XL) 300 MG 24 hr tablet   Other Relevant Orders   CBC with Differential/Platelet (Completed)   Comprehensive metabolic panel (Completed)   Mixed hyperlipidemia    Under good control on current regimen. Continue current regimen. Continue to monitor. Call with any  concerns. Refills given. Labs drawn today.        Relevant Orders   CBC with Differential/Platelet (Completed)   Comprehensive metabolic panel (Completed)   Lipid Panel w/o Chol/HDL Ratio (Completed)    Other Visit Diagnoses    Need for influenza vaccination       Flu shot given today.   Relevant Orders   Flu Vaccine QUAD 6+ mos PF IM (Fluarix Quad PF) (Completed)       Follow up plan: Return in about 4 weeks (around 02/21/2020).

## 2020-01-25 LAB — CBC WITH DIFFERENTIAL/PLATELET
Basophils Absolute: 0.1 10*3/uL (ref 0.0–0.2)
Basos: 1 %
EOS (ABSOLUTE): 0.2 10*3/uL (ref 0.0–0.4)
Eos: 1 %
Hematocrit: 38.3 % (ref 34.0–46.6)
Hemoglobin: 13.4 g/dL (ref 11.1–15.9)
Immature Grans (Abs): 0 10*3/uL (ref 0.0–0.1)
Immature Granulocytes: 0 %
Lymphocytes Absolute: 3.5 10*3/uL — ABNORMAL HIGH (ref 0.7–3.1)
Lymphs: 33 %
MCH: 30.5 pg (ref 26.6–33.0)
MCHC: 35 g/dL (ref 31.5–35.7)
MCV: 87 fL (ref 79–97)
Monocytes Absolute: 0.6 10*3/uL (ref 0.1–0.9)
Monocytes: 6 %
Neutrophils Absolute: 6.2 10*3/uL (ref 1.4–7.0)
Neutrophils: 59 %
Platelets: 312 10*3/uL (ref 150–450)
RBC: 4.4 x10E6/uL (ref 3.77–5.28)
RDW: 13.5 % (ref 11.7–15.4)
WBC: 10.6 10*3/uL (ref 3.4–10.8)

## 2020-01-25 LAB — COMPREHENSIVE METABOLIC PANEL
ALT: 17 IU/L (ref 0–32)
AST: 16 IU/L (ref 0–40)
Albumin/Globulin Ratio: 1.6 (ref 1.2–2.2)
Albumin: 4.2 g/dL (ref 3.8–4.9)
Alkaline Phosphatase: 89 IU/L (ref 48–121)
BUN/Creatinine Ratio: 19 (ref 9–23)
BUN: 15 mg/dL (ref 6–24)
Bilirubin Total: 0.2 mg/dL (ref 0.0–1.2)
CO2: 25 mmol/L (ref 20–29)
Calcium: 9.1 mg/dL (ref 8.7–10.2)
Chloride: 103 mmol/L (ref 96–106)
Creatinine, Ser: 0.8 mg/dL (ref 0.57–1.00)
GFR calc Af Amer: 97 mL/min/{1.73_m2} (ref >59)
GFR calc non Af Amer: 84 mL/min/{1.73_m2} (ref >59)
Globulin, Total: 2.6 g/dL (ref 1.5–4.5)
Glucose: 90 mg/dL (ref 65–99)
Potassium: 4.2 mmol/L (ref 3.5–5.2)
Sodium: 139 mmol/L (ref 134–144)
Total Protein: 6.8 g/dL (ref 6.0–8.5)

## 2020-01-25 LAB — LIPID PANEL W/O CHOL/HDL RATIO
Cholesterol, Total: 206 mg/dL — ABNORMAL HIGH (ref 100–199)
HDL: 68 mg/dL (ref >39)
LDL Chol Calc (NIH): 104 mg/dL — ABNORMAL HIGH (ref 0–99)
Triglycerides: 199 mg/dL — ABNORMAL HIGH (ref 0–149)
VLDL Cholesterol Cal: 34 mg/dL (ref 5–40)

## 2020-01-28 NOTE — Assessment & Plan Note (Signed)
Still not under good control. Will add lexapro. Continue wellbutrin and PRN lorazepam. Call with any concerns. Recheck 1 month.

## 2020-01-28 NOTE — Assessment & Plan Note (Signed)
Under good control on current regimen. Continue current regimen. Continue to monitor. Call with any concerns. Refills given. Labs drawn today.   

## 2020-01-28 NOTE — Assessment & Plan Note (Signed)
Doing much better. Continue current regimen. Continue to monitor. Call with any concerns.  

## 2020-01-29 ENCOUNTER — Other Ambulatory Visit: Payer: Self-pay

## 2020-01-29 ENCOUNTER — Ambulatory Visit (INDEPENDENT_AMBULATORY_CARE_PROVIDER_SITE_OTHER): Payer: 59 | Admitting: Obstetrics and Gynecology

## 2020-01-29 ENCOUNTER — Encounter: Payer: Self-pay | Admitting: Obstetrics and Gynecology

## 2020-01-29 VITALS — BP 150/84 | HR 132 | Ht 62.0 in | Wt 181.0 lb

## 2020-01-29 DIAGNOSIS — N8189 Other female genital prolapse: Secondary | ICD-10-CM

## 2020-01-29 DIAGNOSIS — N393 Stress incontinence (female) (male): Secondary | ICD-10-CM
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
## 2020-01-30 ENCOUNTER — Encounter: Payer: Self-pay | Admitting: Family Medicine

## 2020-02-22 ENCOUNTER — Ambulatory Visit: Payer: 59 | Admitting: Family Medicine

## 2020-03-05 ENCOUNTER — Telehealth: Payer: Self-pay | Admitting: Surgical

## 2020-03-05 NOTE — Telephone Encounter (Signed)
LM for patient to return call to schedule pre op appointment for surgery. Surgery should be back at full capacity by the end of October. If she calls back please schedule for pre op appointment.

## 2020-03-07 ENCOUNTER — Ambulatory Visit (INDEPENDENT_AMBULATORY_CARE_PROVIDER_SITE_OTHER): Payer: 59 | Admitting: Family Medicine

## 2020-03-07 ENCOUNTER — Other Ambulatory Visit: Payer: Self-pay

## 2020-03-07 DIAGNOSIS — F411 Generalized anxiety disorder: Secondary | ICD-10-CM

## 2020-03-07 NOTE — Progress Notes (Signed)
BP 137/88   Pulse (!) 108   Temp 98.2 F (36.8 C) (Oral)   Resp 16   Wt 182 lb 9.6 oz (82.8 kg)   SpO2 98%   BMI 33.40 kg/m    Subjective:    Patient ID: Lindsey Drake, female    DOB: 1966-05-24, 54 y.o.   MRN: [id number]  HPI: Lindsey Drake is a 54 y.o. female  Chief Complaint  Patient presents with  . Anxiety    Patient comes in office today for one month follow up at last visit patient was started on Lexapro 10mg  Patient reports good compliance on medication and fair symtpom control. Paitent reports symptoms are stable at this time and has noticed no improvement with new medication  . Hypertension    Patient returns to office today for one month follow up, patient reports good compliance and tolerance on medicatin. Patient denies symptoms of chest pain. chest pressure, palpitations or headahces.   ANXIETY/DEPRESSION- she has some marital problems that she doesn't know what she wants to do about. This is the source of most of her stress and depression Duration: chronic, about 2 years.  Status:uncontrolled Anxious mood: yes  Excessive worrying: yes Irritability: yes  Sweating: no Nausea: no Palpitations:yes Hyperventilation: no Panic attacks: yes Agoraphobia: yes  Obscessions/compulsions: yes Depressed mood: yes Depression screen [facility] 2/9 12/17/2019 11/21/2018 06/15/2018 03/20/2018 02/14/2018  Decreased Interest 3 3 1 1 3   Down, Depressed, Hopeless 3 3 1  0 1  PHQ - 2 Score 6 6 2 1 4   Altered sleeping 3 3 3 3 3   Tired, decreased energy 3 3 1 1 3   Change in appetite 1 2 0 2 2  Feeling bad or failure about yourself  0 0 0 0 0  Trouble concentrating 2 2 0 0 0  Moving slowly or fidgety/restless 0 0 0 0 0  Suicidal thoughts 0 0 0 0 0  PHQ-9 Score 15 16 6 7 12   Difficult doing work/chores Somewhat difficult Somewhat difficult Somewhat difficult Somewhat difficult Somewhat difficult  Some recent data might be hidden   GAD 7 : Generalized Anxiety Score 11/21/2018 06/15/2018  03/20/2018 02/14/2018  Nervous, Anxious, on Edge 3 1 1 2   Control/stop worrying 3 1 2 2   Worry too much - different things 3 1 2 3   Trouble relaxing 3 0 0 0  Restless 0 0 0 0  Easily annoyed or irritable 3 1 0 3  Afraid - awful might happen 1 0 0 0  Total GAD 7 Score 16 4 5 10   Anxiety Difficulty Somewhat difficult Not difficult at all Not difficult at all Extremely difficult   Anhedonia: no Weight changes: no Insomnia: yes   Hypersomnia: no Fatigue/loss of energy: yes Feelings of worthlessness: yes Feelings of guilt: yes Impaired concentration/indecisiveness: yes Suicidal ideations: no  Crying spells: yes Recent Stressors/Life Changes: yes   Relationship problems: yes   Family stress: yes     Financial stress: yes    Job stress: yes    Recent death/loss: no  Relevant past medical, surgical, family and social history reviewed and updated as indicated. Interim medical history since our last visit reviewed. Allergies and medications reviewed and updated.  Review of Systems  Constitutional: Positive for fatigue. Negative for activity change, appetite change, chills, diaphoresis, fever and unexpected weight change.  HENT: Negative.   Respiratory: Negative.   Cardiovascular: Negative.   Gastrointestinal: Negative.   Musculoskeletal: Negative.   Skin: Negative.   Psychiatric/Behavioral: Positive  for agitation and dysphoric mood. Negative for behavioral problems, confusion, decreased concentration, hallucinations, self-injury, sleep disturbance and suicidal ideas. The patient is nervous/anxious. The patient is not hyperactive.     Per HPI unless specifically indicated above     Objective:    BP 137/88   Pulse (!) 108   Temp 98.2 F (36.8 C) (Oral)   Resp 16   Wt 182 lb 9.6 oz (82.8 kg)   SpO2 98%   BMI 33.40 kg/m   Wt Readings from Last 3 Encounters:  03/07/20 182 lb 9.6 oz (82.8 kg)  01/29/20 181 lb (82.1 kg)  01/24/20 183 lb 3.2 oz (83.1 kg)    Physical  Exam Vitals and nursing note reviewed.  Constitutional:      General: She is not in acute distress.    Appearance: Normal appearance. She is not ill-appearing, toxic-appearing or diaphoretic.  HENT:     Head: Normocephalic and atraumatic.     Right Ear: External ear normal.     Left Ear: External ear normal.     Nose: Nose normal.     Mouth/Throat:     Mouth: Mucous membranes are moist.     Pharynx: Oropharynx is clear.  Eyes:     General: No scleral icterus.       Right eye: No discharge.        Left eye: No discharge.     Extraocular Movements: Extraocular movements intact.     Conjunctiva/sclera: Conjunctivae normal.     Pupils: Pupils are equal, round, and reactive to light.  Cardiovascular:     Rate and Rhythm: Normal rate and regular rhythm.     Pulses: Normal pulses.     Heart sounds: Normal heart sounds. No murmur heard.  No friction rub. No gallop.   Pulmonary:     Effort: Pulmonary effort is normal. No respiratory distress.     Breath sounds: Normal breath sounds. No stridor. No wheezing, rhonchi or rales.  Chest:     Chest wall: No tenderness.  Musculoskeletal:        General: Normal range of motion.     Cervical back: Normal range of motion and neck supple.  Skin:    General: Skin is warm and dry.     Capillary Refill: Capillary refill takes less than 2 seconds.     Coloration: Skin is not jaundiced or pale.     Findings: No bruising, erythema, lesion or rash.  Neurological:     General: No focal deficit present.     Mental Status: She is alert and oriented to person, place, and time. Mental status is at baseline.  Psychiatric:        Mood and Affect: Mood is anxious and depressed.        Behavior: Behavior normal.        Thought Content: Thought content normal.        Judgment: Judgment normal.     Results for orders placed or performed in visit on 01/24/20  CBC with Differential/Platelet  Result Value Ref Range   WBC 10.6 3.4 - 10.8 x10E3/uL   RBC  4.40 3.77 - 5.28 x10E6/uL   Hemoglobin 13.4 11.1 - 15.9 g/dL   Hematocrit 72.5 36.6 - 46.6 %   MCV 87 79 - 97 fL   MCH 30.5 26.6 - 33.0 pg   MCHC 35.0 31 - 35 g/dL   RDW 44.0 34.7 - 42.5 %   Platelets 312 150 - 450 x10E3/uL  Neutrophils 59 Not Estab. %   Lymphs 33 Not Estab. %   Monocytes 6 Not Estab. %   Eos 1 Not Estab. %   Basos 1 Not Estab. %   Neutrophils Absolute 6.2 1 - 7 x10E3/uL   Lymphocytes Absolute 3.5 (H) 0 - 3 x10E3/uL   Monocytes Absolute 0.6 0 - 0 x10E3/uL   EOS (ABSOLUTE) 0.2 0.0 - 0.4 x10E3/uL   Basophils Absolute 0.1 0 - 0 x10E3/uL   Immature Granulocytes 0 Not Estab. %   Immature Grans (Abs) 0.0 0.0 - 0.1 x10E3/uL  Comprehensive metabolic panel  Result Value Ref Range   Glucose 90 65 - 99 mg/dL   BUN 15 6 - 24 mg/dL   Creatinine, Ser 0.86 0.57 - 1.00 mg/dL   GFR calc non Af Amer 84 >59 mL/min/1.73   GFR calc Af Amer 97 >59 mL/min/1.73   BUN/Creatinine Ratio 19 9 - 23   Sodium 139 134 - 144 mmol/L   Potassium 4.2 3.5 - 5.2 mmol/L   Chloride 103 96 - 106 mmol/L   CO2 25 20 - 29 mmol/L   Calcium 9.1 8.7 - 10.2 mg/dL   Total Protein 6.8 6.0 - 8.5 g/dL   Albumin 4.2 3.8 - 4.9 g/dL   Globulin, Total 2.6 1.5 - 4.5 g/dL   Albumin/Globulin Ratio 1.6 1.2 - 2.2   Bilirubin Total 0.2 0.0 - 1.2 mg/dL   Alkaline Phosphatase 89 48 - 121 IU/L   AST 16 0 - 40 IU/L   ALT 17 0 - 32 IU/L  Lipid Panel w/o Chol/HDL Ratio  Result Value Ref Range   Cholesterol, Total 206 (H) 100 - 199 mg/dL   Triglycerides 761 (H) 0 - 149 mg/dL   HDL 68 >95 mg/dL   VLDL Cholesterol Cal 34 5 - 40 mg/dL   LDL Chol Calc (NIH) 093 (H) 0 - 99 mg/dL      Assessment & Plan:   Problem List Items Addressed This Visit      Other   Anxiety disorder    After long discussion with patient today, she has several social stressors that are contributing to her symptoms. Discussed increasing medicine, but it's unlikely to fix the problem. We will get her to talk to our social worker with the CCM  team and gave her a list of counselors today. Continue current regimen for now. Call with any concerns. Recheck 1 month.       Relevant Orders   Referral to Chronic Care Management Services       Follow up plan: Return in about 4 weeks (around 04/04/2020).  >50 minutes spent with patient today

## 2020-03-09 ENCOUNTER — Encounter: Payer: Self-pay | Admitting: Family Medicine

## 2020-03-09 NOTE — Assessment & Plan Note (Signed)
After long discussion with patient today, she has several social stressors that are contributing to her symptoms. Discussed increasing medicine, but it's unlikely to fix the problem. We will get her to talk to our social worker with the CCM team and gave her a list of counselors today. Continue current regimen for now. Call with any concerns. Recheck 1 month.

## 2020-03-10 ENCOUNTER — Telehealth: Payer: Self-pay

## 2020-03-10 NOTE — Chronic Care Management (AMB) (Signed)
  Care Management   Outreach Note  03/10/2020 Name: Lindsey Drake MRN: [id number] DOB: [birth year]  Referred by: Dorcas Carrow, DO Reason for referral : Care Coordination (outreach to schedule referral for LCSW)   An unsuccessful telephone outreach was attempted today. The patient was referred to the case management team for assistance with care management and care coordination.   Follow Up Plan: A HIPAA compliant phone message was left for the patient providing contact information and requesting a return call.  The care management team will reach out to the patient again over the next 7 days.  If patient returns call to provider office, please advise to call Embedded Care Management Care Guide Penne Lash at [phone]  Penne Lash, RMA Care Guide, Embedded Care Coordination [facility]  [place], [place] [address] Direct Dial: [phone] [email or web address] Website: [email or web address]

## 2020-03-11 ENCOUNTER — Telehealth: Payer: Self-pay | Admitting: *Deleted

## 2020-03-11 NOTE — Telephone Encounter (Signed)
  Care Management   Unsuccessful Call Note 03/11/2020 Name: Lindsey Drake MRN: [id number] DOB: [birth year]  Patient  is a 54 year old female  who sees Olevia Perches, DO for primary care. Olevia Perches asked the CCM team to consult the patient for Mental Health Counseling and Resources.  Referral was placed 03/09/20 Patient's last office visit was 03/07/20.     This social worker was unable to reach patient via telephone today for initial intake call. I have left HIPAA compliant voicemail asking patient to return my call. (unsuccessful outreach #1).   Plan: Will follow-up within 7 business days via telephone.     Verna Czech, [place] Embedded Social Worker [phone]

## 2020-03-17 NOTE — Chronic Care Management (AMB) (Signed)
  Care Management   Note  03/17/2020 Name: Lindsey Drake MRN: [id number] DOB: Aug 06, 1965  Lindsey Drake is a 54 y.o. year old female who is a primary care patient of Dorcas Carrow, DO. I reached out to Sula Rumple by phone today in response to a referral sent by [name]'s health plan.    [name] was given information about care management services today including:  1. Care management services include personalized support from designated clinical staff supervised by her physician, including individualized plan of care and coordination with other care providers 2. 24/7 contact phone numbers for assistance for urgent and routine care needs. 3. The patient may stop care management services at any time by phone call to the office staff.  Patient did not agree to enrollment in care management services and does not wish to consider at this time.  Follow up plan: Patient declines further follow up and engagement by the care management team. Appropriate care team members and provider have been notified via electronic communication.   Penne Lash, RMA Care Guide, Embedded Care Coordination [facility]  [place], [place] [address] Direct Dial: [phone] [email or web address]: [email or web address]

## 2020-03-18 ENCOUNTER — Encounter: Payer: Self-pay | Admitting: Obstetrics and Gynecology

## 2020-03-18 ENCOUNTER — Ambulatory Visit (INDEPENDENT_AMBULATORY_CARE_PROVIDER_SITE_OTHER): Payer: 59 | Admitting: Obstetrics and Gynecology

## 2020-03-18 ENCOUNTER — Other Ambulatory Visit: Payer: Self-pay

## 2020-03-18 VITALS — BP 160/96 | HR 109 | Ht 62.0 in | Wt 181.8 lb

## 2020-03-18 DIAGNOSIS — N812 Incomplete uterovaginal prolapse: Secondary | ICD-10-CM | POA: Diagnosis not present

## 2020-03-18 DIAGNOSIS — N393 Stress incontinence (female) (male): Secondary | ICD-10-CM | POA: Diagnosis not present

## 2020-03-18 DIAGNOSIS — N8189 Other female genital prolapse: Secondary | ICD-10-CM | POA: Diagnosis not present
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
## 2020-03-26 ENCOUNTER — Other Ambulatory Visit: Payer: Self-pay

## 2020-03-26 ENCOUNTER — Other Ambulatory Visit
Admission: RE | Admit: 2020-03-26 | Discharge: 2020-03-26 | Disposition: A | Discharge status: Home / Self Care | Payer: 59 | Source: Ambulatory Visit | Attending: Obstetrics and Gynecology | Admitting: Obstetrics and Gynecology

## 2020-03-26 HISTORY — DX: Nausea with vomiting, unspecified: R11.2

## 2020-03-26 HISTORY — DX: Depression, unspecified: F32.A

## 2020-03-26 HISTORY — DX: Other specified postprocedural states: Z98.890

## 2020-03-26 NOTE — Patient Instructions (Signed)
Your procedure is scheduled on: Monday March 31, 2020 Report to Day Surgery on the 2nd floor of the [facility]. CHECK IN AT REGISTRATION UNLESS INSTRUCTED DIFFERENTLY To find out your arrival time, please call [phone] between 1PM - 3PM on: March 28, 2020 Friday   REMEMBER: Instructions that are not followed completely may result in serious medical risk, up to and including death; or upon the discretion of your surgeon and anesthesiologist your surgery may need to be rescheduled.  Do not eat food after midnight the night before surgery.  No gum chewing, lozengers or hard candies.  You may however, drink CLEAR liquids up to 2 hours before you are scheduled to arrive for your surgery. Do not drink anything within 2 hours of your scheduled arrival time.  Clear liquids include: - water  - apple juice without pulp - black coffee or tea (Do NOT add milk or creamers to the coffee or tea) Do NOT drink anything that is not on this list.  Type 1 and Type 2 diabetics should only drink water.  In addition, your doctor has ordered for you to drink the provided  Ensure Pre-Surgery Clear Carbohydrate Drink  Drinking this carbohydrate drink up to two hours before surgery helps to reduce insulin resistance and improve patient outcomes. Please complete drinking 2 hours prior to scheduled arrival time.  TAKE THESE MEDICATIONS THE MORNING OF SURGERY WITH A SIP OF WATER: ATORVASTATIN  BUPROPION BUSPIRONE ESCITALOPRAM METOPROLOL    One week prior to surgery: Stop Anti-inflammatories (NSAIDS) such as Advil, Aleve, Ibuprofen, Motrin, Naproxen, Naprosyn and ASPIRIN OR  Aspirin based products such as Excedrin, Goodys Powder, BC Powder. Stop ANY OVER THE COUNTER supplements until after surgery. STOP GLUCOSAMINE (You may continue taking Tylenol, Vitamin D, Vitamin B, and multivitamin.)  No Alcohol for 24 hours before or after surgery.  No Smoking including e-cigarettes for 24 hours prior to  surgery.  No chewable tobacco products for at least 6 hours prior to surgery.  No nicotine patches on the day of surgery.  Do not use any "recreational" drugs for at least a week prior to your surgery.  Please be advised that the combination of cocaine and anesthesia may have negative outcomes, up to and including death. If you test positive for cocaine, your surgery will be cancelled.  On the morning of surgery brush your teeth with toothpaste and water, you may rinse your mouth with mouthwash if you wish. Do not swallow any toothpaste or mouthwash.  Do not wear jewelry, make-up, hairpins, clips or nail polish.  Do not wear lotions, powders, or perfumes OR DEODORANT   Do not shave 48 hours prior to surgery.   Contact lenses, hearing aids and dentures may not be worn into surgery.   [facility] is not responsible for any missing/lost belongings or valuables.   TAKE SHOWER MORNING OF SURGERY  Notify your doctor if there is any change in your medical condition (cold, fever, infection).  Wear comfortable clothing (specific to your surgery type) to the hospital.  Plan for stool softeners for home use; pain medications have a tendency to cause constipation. You can also help prevent constipation by eating foods high in fiber such as fruits and vegetables and drinking plenty of fluids as your diet allows.  After surgery, you can help prevent lung complications by doing breathing exercises.  Take deep breaths and cough every 1-2 hours. Your doctor may order a device called an Incentive Spirometer to help you take deep breaths.  When coughing or sneezing, hold a pillow firmly against your incision with both hands. This is called "splinting." Doing this helps protect your incision. It also decreases belly discomfort.  If you are being admitted to the hospital overnight YOU MAY BRING A SMALL BAG  If you are being discharged the day of surgery, you will not be allowed to drive home. You will  need a responsible adult (18 years or older) to drive you home and stay with you that night.   Please call the Pre-admissions Testing Dept. at [phone] if you have any questions about these instructions.  Visitation Policy:  Patients undergoing a surgery or procedure may have one family member or support person with them as long as that person is not COVID-19 positive or experiencing its symptoms.  That person may remain in the waiting area during the procedure.  Inpatient Visitation Update:   In an effort to ensure the safety of our team members and our patients, we are implementing a change to our visitation policy:  Effective Monday, Aug. 9, at 7 a.m., inpatients will be allowed one support person.  o The support person may change daily.  o The support person must pass our screening, gel in and out, and wear a mask at all times, including in the patient's room.  o Patients must also wear a mask when staff or their support person are in the room.  o Masking is required regardless of vaccination status.  Systemwide, no visitors 17 or younger.

## 2020-03-28 ENCOUNTER — Encounter
Admission: RE | Admit: 2020-03-28 | Discharge: 2020-03-28 | Disposition: A | Discharge status: Home / Self Care | Payer: 59 | Source: Ambulatory Visit | Attending: Obstetrics and Gynecology | Admitting: Obstetrics and Gynecology

## 2020-03-28 ENCOUNTER — Other Ambulatory Visit: Payer: Self-pay

## 2020-03-28 DIAGNOSIS — I10 Essential (primary) hypertension: Secondary | ICD-10-CM | POA: Diagnosis not present

## 2020-03-28 DIAGNOSIS — Z20822 Contact with and (suspected) exposure to covid-19: Secondary | ICD-10-CM | POA: Diagnosis not present

## 2020-03-28 DIAGNOSIS — Z01818 Encounter for other preprocedural examination: Secondary | ICD-10-CM | POA: Diagnosis present

## 2020-03-28 LAB — CBC
HCT: 39.7 % (ref 36.0–46.0)
Hemoglobin: 13.8 g/dL (ref 12.0–15.0)
MCH: 30.8 pg (ref 26.0–34.0)
MCHC: 34.8 g/dL (ref 30.0–36.0)
MCV: 88.6 fL (ref 80.0–100.0)
Platelets: 264 10*3/uL (ref 150–400)
RBC: 4.48 MIL/uL (ref 3.87–5.11)
RDW: 13.3 % (ref 11.5–15.5)
WBC: 8.5 10*3/uL (ref 4.0–10.5)
nRBC: 0 % (ref 0.0–0.2)

## 2020-03-28 LAB — TYPE AND SCREEN
ABO/RH(D): A POS
Antibody Screen: NEGATIVE

## 2020-03-29 LAB — SARS CORONAVIRUS 2 (TAT 6-24 HRS): SARS Coronavirus 2: NEGATIVE

## 2020-03-30 MED ORDER — FAMOTIDINE 20 MG PO TABS
20.0000 mg | ORAL_TABLET | Freq: Once | ORAL | Status: AC
  Administered 2020-03-31: 20 mg via ORAL

## 2020-03-30 MED ORDER — LACTATED RINGERS IV SOLN: INTRAVENOUS | Status: DC

## 2020-03-30 MED ORDER — CHLORHEXIDINE GLUCONATE 0.12 % MT SOLN
15.0000 mL | Freq: Once | OROMUCOSAL | Status: AC
  Administered 2020-03-31: 15 mL via OROMUCOSAL

## 2020-03-30 MED ORDER — ORAL CARE MOUTH RINSE: 15.0000 mL | Freq: Once | OROMUCOSAL | Status: AC

## 2020-03-31 ENCOUNTER — Encounter
Admission: RE | Disposition: A | Discharge status: Home / Self Care | Payer: Self-pay | Source: Home / Self Care | Attending: Obstetrics and Gynecology

## 2020-03-31 ENCOUNTER — Observation Stay
Admission: RE | Admit: 2020-03-31 | Discharge: 2020-04-01 | Disposition: A | Discharge status: Home / Self Care | Payer: 59 | Attending: Obstetrics and Gynecology | Admitting: Obstetrics and Gynecology

## 2020-03-31 ENCOUNTER — Encounter: Payer: Self-pay | Admitting: Obstetrics and Gynecology

## 2020-03-31 ENCOUNTER — Other Ambulatory Visit: Payer: Self-pay

## 2020-03-31 ENCOUNTER — Inpatient Hospital Stay: Payer: 59

## 2020-03-31 DIAGNOSIS — I10 Essential (primary) hypertension: Secondary | ICD-10-CM | POA: Insufficient documentation

## 2020-03-31 DIAGNOSIS — N8189 Other female genital prolapse: Secondary | ICD-10-CM | POA: Diagnosis present

## 2020-03-31 DIAGNOSIS — Z79899 Other long term (current) drug therapy: Secondary | ICD-10-CM | POA: Diagnosis not present

## 2020-03-31 DIAGNOSIS — R339 Retention of urine, unspecified: Secondary | ICD-10-CM | POA: Diagnosis not present

## 2020-03-31 DIAGNOSIS — N393 Stress incontinence (female) (male): Secondary | ICD-10-CM | POA: Diagnosis not present

## 2020-03-31 DIAGNOSIS — N812 Incomplete uterovaginal prolapse: Principal | ICD-10-CM | POA: Insufficient documentation

## 2020-03-31 DIAGNOSIS — Z9889 Other specified postprocedural states: Secondary | ICD-10-CM

## 2020-03-31 HISTORY — PX: BLADDER SUSPENSION: SHX72

## 2020-03-31 HISTORY — PX: ANTERIOR AND POSTERIOR REPAIR: SHX5121

## 2020-03-31 SURGERY — ANTERIOR (CYSTOCELE) AND POSTERIOR REPAIR (RECTOCELE)
Anesthesia: General

## 2020-03-31 MED ORDER — PROPOFOL 500 MG/50ML IV EMUL
INTRAVENOUS | Status: DC | PRN
  Administered 2020-03-31: 10 ug/kg/min via INTRAVENOUS

## 2020-03-31 MED ORDER — SUGAMMADEX SODIUM 200 MG/2ML IV SOLN
INTRAVENOUS | Status: DC | PRN
  Administered 2020-03-31: 200 mg via INTRAVENOUS

## 2020-03-31 MED ORDER — DEXAMETHASONE SODIUM PHOSPHATE 10 MG/ML IJ SOLN
INTRAMUSCULAR | Status: AC
  Filled 2020-03-31: qty 1

## 2020-03-31 MED ORDER — LIDOCAINE HCL (PF) 2 % IJ SOLN
INTRAMUSCULAR | Status: AC
  Filled 2020-03-31: qty 5

## 2020-03-31 MED ORDER — CEFAZOLIN SODIUM-DEXTROSE 2-3 GM-%(50ML) IV SOLR
INTRAVENOUS | Status: DC | PRN
  Administered 2020-03-31: 2 g via INTRAVENOUS

## 2020-03-31 MED ORDER — POVIDONE-IODINE 10 % EX SWAB
2.0000 "application " | Freq: Once | CUTANEOUS | Status: AC
  Administered 2020-03-31: 2 via TOPICAL

## 2020-03-31 MED ORDER — PHENYLEPHRINE HCL (PRESSORS) 10 MG/ML IV SOLN
INTRAVENOUS | Status: DC | PRN
  Administered 2020-03-31: 150 ug via INTRAVENOUS
  Administered 2020-03-31: 100 ug via INTRAVENOUS
  Administered 2020-03-31: 150 ug via INTRAVENOUS
  Administered 2020-03-31: 100 ug via INTRAVENOUS
  Administered 2020-03-31: 150 ug via INTRAVENOUS

## 2020-03-31 MED ORDER — SCOPOLAMINE 1 MG/3DAYS TD PT72
MEDICATED_PATCH | TRANSDERMAL | Status: AC
  Filled 2020-03-31: qty 1

## 2020-03-31 MED ORDER — DEXTROSE IN LACTATED RINGERS 5 % IV SOLN: INTRAVENOUS | Status: DC

## 2020-03-31 MED ORDER — ONDANSETRON HCL 4 MG/2ML IJ SOLN
INTRAMUSCULAR | Status: AC
  Filled 2020-03-31: qty 2

## 2020-03-31 MED ORDER — FENTANYL CITRATE (PF) 100 MCG/2ML IJ SOLN
INTRAMUSCULAR | Status: DC | PRN
  Administered 2020-03-31: 50 ug via INTRAVENOUS

## 2020-03-31 MED ORDER — PROPOFOL 10 MG/ML IV BOLUS
INTRAVENOUS | Status: DC | PRN
  Administered 2020-03-31: 120 mg via INTRAVENOUS

## 2020-03-31 MED ORDER — SUCCINYLCHOLINE CHLORIDE 20 MG/ML IJ SOLN
INTRAMUSCULAR | Status: DC | PRN
  Administered 2020-03-31: 100 mg via INTRAVENOUS

## 2020-03-31 MED ORDER — VASOPRESSIN 20 UNIT/ML IV SOLN
INTRAVENOUS | Status: AC
  Filled 2020-03-31: qty 1

## 2020-03-31 MED ORDER — ONDANSETRON HCL 4 MG PO TABS: 4.0000 mg | ORAL_TABLET | Freq: Four times a day (QID) | ORAL | Status: DC | PRN

## 2020-03-31 MED ORDER — ROCURONIUM BROMIDE 100 MG/10ML IV SOLN
INTRAVENOUS | Status: DC | PRN
  Administered 2020-03-31: 15 mg via INTRAVENOUS
  Administered 2020-03-31: 40 mg via INTRAVENOUS
  Administered 2020-03-31: 10 mg via INTRAVENOUS
  Administered 2020-03-31: 15 mg via INTRAVENOUS
  Administered 2020-03-31: 10 mg via INTRAVENOUS

## 2020-03-31 MED ORDER — ESTROGENS, CONJUGATED 0.625 MG/GM VA CREA
TOPICAL_CREAM | VAGINAL | Status: DC | PRN
  Administered 2020-03-31: 1 via VAGINAL

## 2020-03-31 MED ORDER — FAMOTIDINE 20 MG PO TABS
ORAL_TABLET | ORAL | Status: AC
  Filled 2020-03-31: qty 1

## 2020-03-31 MED ORDER — KETOROLAC TROMETHAMINE 30 MG/ML IJ SOLN: 30.0000 mg | Freq: Once | INTRAMUSCULAR | Status: DC

## 2020-03-31 MED ORDER — KETOROLAC TROMETHAMINE 30 MG/ML IJ SOLN
INTRAMUSCULAR | Status: AC
  Filled 2020-03-31: qty 1

## 2020-03-31 MED ORDER — ROCURONIUM BROMIDE 10 MG/ML (PF) SYRINGE
PREFILLED_SYRINGE | INTRAVENOUS | Status: AC
  Filled 2020-03-31: qty 10

## 2020-03-31 MED ORDER — MIDAZOLAM HCL 2 MG/2ML IJ SOLN
INTRAMUSCULAR | Status: DC | PRN
  Administered 2020-03-31: 2 mg via INTRAVENOUS

## 2020-03-31 MED ORDER — FENTANYL CITRATE (PF) 100 MCG/2ML IJ SOLN
25.0000 ug | INTRAMUSCULAR | Status: DC | PRN
  Administered 2020-03-31: 25 ug via INTRAVENOUS

## 2020-03-31 MED ORDER — PROPOFOL 10 MG/ML IV BOLUS
INTRAVENOUS | Status: AC
  Filled 2020-03-31: qty 20

## 2020-03-31 MED ORDER — SCOPOLAMINE 1 MG/3DAYS TD PT72
1.0000 | MEDICATED_PATCH | TRANSDERMAL | Status: DC
  Administered 2020-03-31: 1.5 mg via TRANSDERMAL

## 2020-03-31 MED ORDER — DEXAMETHASONE SODIUM PHOSPHATE 10 MG/ML IJ SOLN
INTRAMUSCULAR | Status: DC | PRN
  Administered 2020-03-31: 10 mg via INTRAVENOUS

## 2020-03-31 MED ORDER — ONDANSETRON HCL 4 MG/2ML IJ SOLN
INTRAMUSCULAR | Status: DC | PRN
  Administered 2020-03-31: 4 mg via INTRAVENOUS

## 2020-03-31 MED ORDER — KETAMINE HCL 50 MG/ML IJ SOLN
INTRAMUSCULAR | Status: AC
  Filled 2020-03-31: qty 10

## 2020-03-31 MED ORDER — CHLORHEXIDINE GLUCONATE 0.12 % MT SOLN
OROMUCOSAL | Status: AC
  Filled 2020-03-31: qty 15

## 2020-03-31 MED ORDER — FENTANYL CITRATE (PF) 100 MCG/2ML IJ SOLN
INTRAMUSCULAR | Status: AC
  Filled 2020-03-31: qty 2

## 2020-03-31 MED ORDER — PHENYLEPHRINE HCL (PRESSORS) 10 MG/ML IV SOLN
INTRAVENOUS | Status: AC
  Filled 2020-03-31: qty 1

## 2020-03-31 MED ORDER — ESTROGENS, CONJUGATED 0.625 MG/GM VA CREA
TOPICAL_CREAM | VAGINAL | Status: AC
  Filled 2020-03-31: qty 30

## 2020-03-31 MED ORDER — KETOROLAC TROMETHAMINE 30 MG/ML IJ SOLN
INTRAMUSCULAR | Status: DC | PRN
  Administered 2020-03-31: 30 mg via INTRAVENOUS

## 2020-03-31 MED ORDER — ONDANSETRON HCL 4 MG/2ML IJ SOLN: 4.0000 mg | Freq: Once | INTRAMUSCULAR | Status: DC | PRN

## 2020-03-31 MED ORDER — SUCCINYLCHOLINE CHLORIDE 200 MG/10ML IV SOSY
PREFILLED_SYRINGE | INTRAVENOUS | Status: AC
  Filled 2020-03-31: qty 10

## 2020-03-31 MED ORDER — CEFAZOLIN SODIUM-DEXTROSE 2-4 GM/100ML-% IV SOLN
INTRAVENOUS | Status: AC
  Filled 2020-03-31: qty 100

## 2020-03-31 MED ORDER — SIMETHICONE 80 MG PO CHEW: 80.0000 mg | CHEWABLE_TABLET | Freq: Four times a day (QID) | ORAL | Status: DC | PRN

## 2020-03-31 MED ORDER — ACETAMINOPHEN 10 MG/ML IV SOLN
INTRAVENOUS | Status: AC
  Filled 2020-03-31: qty 100

## 2020-03-31 MED ORDER — ONDANSETRON HCL 4 MG/2ML IJ SOLN: 4.0000 mg | Freq: Four times a day (QID) | INTRAMUSCULAR | Status: DC | PRN

## 2020-03-31 MED ORDER — IBUPROFEN 800 MG PO TABS
800.0000 mg | ORAL_TABLET | Freq: Three times a day (TID) | ORAL | Status: DC
  Administered 2020-03-31 – 2020-04-01 (×4): 800 mg via ORAL
  Filled 2020-03-31 (×4): qty 1

## 2020-03-31 MED ORDER — FENTANYL CITRATE (PF) 100 MCG/2ML IJ SOLN
INTRAMUSCULAR | Status: AC
  Administered 2020-03-31: 25 ug via INTRAVENOUS
  Filled 2020-03-31: qty 2

## 2020-03-31 MED ORDER — VASOPRESSIN 20 UNIT/ML IV SOLN
INTRAVENOUS | Status: DC | PRN
  Administered 2020-03-31: 2 [IU] via SUBCUTANEOUS
  Administered 2020-03-31 (×2): 1.4 [IU] via SUBCUTANEOUS

## 2020-03-31 MED ORDER — KETAMINE HCL 10 MG/ML IJ SOLN
INTRAMUSCULAR | Status: DC | PRN
  Administered 2020-03-31: 20 mg via INTRAVENOUS
  Administered 2020-03-31 (×2): 10 mg via INTRAVENOUS

## 2020-03-31 MED ORDER — ACETAMINOPHEN 10 MG/ML IV SOLN
INTRAVENOUS | Status: DC | PRN
  Administered 2020-03-31: 1000 mg via INTRAVENOUS

## 2020-03-31 MED ORDER — LIDOCAINE HCL (CARDIAC) PF 100 MG/5ML IV SOSY
PREFILLED_SYRINGE | INTRAVENOUS | Status: DC | PRN
  Administered 2020-03-31: 80 mg via INTRAVENOUS

## 2020-03-31 MED ORDER — SODIUM CHLORIDE (PF) 0.9 % IJ SOLN
INTRAMUSCULAR | Status: AC
  Filled 2020-03-31: qty 10

## 2020-03-31 MED ORDER — MIDAZOLAM HCL 2 MG/2ML IJ SOLN
INTRAMUSCULAR | Status: AC
  Filled 2020-03-31: qty 2

## 2020-03-31 MED ORDER — SEVOFLURANE IN SOLN
RESPIRATORY_TRACT | Status: AC
  Filled 2020-03-31: qty 250

## 2020-03-31 MED ORDER — OXYCODONE-ACETAMINOPHEN 5-325 MG PO TABS
1.0000 | ORAL_TABLET | ORAL | Status: DC | PRN
  Administered 2020-03-31 (×2): 2 via ORAL
  Administered 2020-04-01 (×2): 1 via ORAL
  Filled 2020-03-31 (×3): qty 2
  Filled 2020-03-31: qty 1

## 2020-03-31 MED ORDER — SODIUM CHLORIDE (PF) 0.9 % IJ SOLN
INTRAMUSCULAR | Status: AC
  Filled 2020-03-31: qty 50

## 2020-03-31 MED ORDER — SILVER NITRATE-POT NITRATE 75-25 % EX MISC
CUTANEOUS | Status: AC
  Filled 2020-03-31: qty 10

## 2020-03-31 MED ORDER — SODIUM CHLORIDE 0.9 % IV SOLN
INTRAVENOUS | Status: DC | PRN
  Administered 2020-03-31: 20 ug/min via INTRAVENOUS

## 2020-03-31 SURGICAL SUPPLY — 48 items
"PENCIL ELECTRO HAND CTR " (MISCELLANEOUS) ×1 IMPLANT
ADHESIVE MASTISOL STRL (MISCELLANEOUS) ×2 IMPLANT
BAG DECANTER FOR FLEXI CONT (MISCELLANEOUS) ×2 IMPLANT
BAG DRN RND TRDRP ANRFLXCHMBR (UROLOGICAL SUPPLIES) ×1
BAG URINE DRAIN 2000ML AR STRL (UROLOGICAL SUPPLIES) ×1 IMPLANT
BLADE SURG 15 STRL SS SAFETY (BLADE) ×2 IMPLANT
BLADE SURG SZ10 CARB STEEL (BLADE) ×2 IMPLANT
CATH FOL 2WAY LX 16X5 (CATHETERS) ×1 IMPLANT
CLEANER CAUTERY TIP 5X5 PAD (MISCELLANEOUS) ×1 IMPLANT
COVER MAYO STAND REUSABLE (DRAPES) ×2 IMPLANT
COVER WAND RF STERILE (DRAPES) ×2 IMPLANT
DRAPE PERI LITHO V/GYN (MISCELLANEOUS) ×2 IMPLANT
DRAPE UTILITY 15X26 TOWEL STRL (DRAPES) ×2 IMPLANT
DRAPE XRAY CASSETTE 23X24 (DRAPES) ×2 IMPLANT
ELECT REM PT RETURN 9FT ADLT (ELECTROSURGICAL) ×2
ELECTRODE REM PT RTRN 9FT ADLT (ELECTROSURGICAL) ×1 IMPLANT
GAUZE 4X4 16PLY RFD (DISPOSABLE) ×4 IMPLANT
GAUZE PACK 2X3YD (PACKING) ×1 IMPLANT
GAUZE PACKING 1/2X5YD (GAUZE/BANDAGES/DRESSINGS) ×1 IMPLANT
GLOVE BIOGEL PI ORTHO PRO 7.5 (GLOVE) ×7
GLOVE PI ORTHO PRO STRL 7.5 (GLOVE) ×1 IMPLANT
GOWN STRL REUS W/ TWL LRG LVL3 (GOWN DISPOSABLE) ×2 IMPLANT
GOWN STRL REUS W/TWL 2XL LVL3 (GOWN DISPOSABLE) ×3 IMPLANT
GOWN STRL REUS W/TWL LRG LVL3 (GOWN DISPOSABLE) ×4
HANDLE YANKAUER SUCT BULB TIP (MISCELLANEOUS) ×2 IMPLANT
IV NS 100ML SINGLE PACK (IV SOLUTION) ×1 IMPLANT
MANIFOLD NEPTUNE II (INSTRUMENTS) ×2 IMPLANT
NDL SAFETY ECLIPSE 18X1.5 (NEEDLE) IMPLANT
NEEDLE HYPO 18GX1.5 SHARP (NEEDLE) ×2
NEEDLE HYPO 22GX1.5 SAFETY (NEEDLE) ×2 IMPLANT
PACK BASIN MINOR (MISCELLANEOUS) ×2 IMPLANT
PAD CLEANER CAUTERY TIP 5X5 (MISCELLANEOUS)
PAD OB MATERNITY 4.3X12.25 (PERSONAL CARE ITEMS) ×2 IMPLANT
PENCIL ELECTRO HAND CTR (MISCELLANEOUS) ×2 IMPLANT
SLING TRANSOBTURATOR OBTRYX (Sling) ×1 IMPLANT
STRIP CLOSURE SKIN 1/2X4 (GAUZE/BANDAGES/DRESSINGS) ×2 IMPLANT
SURGILUBE 2OZ TUBE FLIPTOP (MISCELLANEOUS) ×2 IMPLANT
SUT VIC AB 0 CT1 27 (SUTURE) ×2
SUT VIC AB 0 CT1 27XCR 8 STRN (SUTURE) IMPLANT
SUT VIC AB 0 CT1 36 (SUTURE) ×4 IMPLANT
SUT VIC AB 1 CT1 18XCR BRD 8 (SUTURE) ×2 IMPLANT
SUT VIC AB 1 CT1 8-18 (SUTURE) ×4
SUT VICRYL 0 AB UR-6 (SUTURE) ×4 IMPLANT
SYR 10ML 18GX1 1/2 (NEEDLE) ×1 IMPLANT
SYR 10ML LL (SYRINGE) ×2 IMPLANT
SYR 3ML LL SCALE MARK (SYRINGE) ×1 IMPLANT
TOWEL OR 17X26 4PK STRL BLUE (TOWEL DISPOSABLE) ×2 IMPLANT
TUBING CONNECTING 10 (TUBING) ×2 IMPLANT

## 2020-03-31 NOTE — Anesthesia Postprocedure Evaluation (Signed)
Anesthesia Post Note  Patient: Lindsey Drake  Procedure(s) Performed: ANTERIOR (CYSTOCELE) AND POSTERIOR REPAIR (RECTOCELE) (N/A ) SPARC PROCEDURE (N/A )  Patient location during evaluation: PACU Anesthesia Type: General Level of consciousness: awake and alert and oriented Pain management: pain level controlled Vital Signs Assessment: post-procedure vital signs reviewed and stable Respiratory status: spontaneous breathing, nonlabored ventilation and respiratory function stable Cardiovascular status: blood pressure returned to baseline and stable Postop Assessment: no signs of nausea or vomiting Anesthetic complications: no   No complications documented.   Last Vitals:  Vitals:   03/31/20 1053 03/31/20 1156  BP: 116/83 127/87  Pulse: 72 68  Resp: 14 16  Temp: 36.6 C 36.6 C  SpO2: 98% 97%    Last Pain:  Vitals:   03/31/20 1203  TempSrc:   PainSc: 10-Worst pain ever                 Henley Blyth

## 2020-03-31 NOTE — Addendum Note (Signed)
Addendum  created 03/31/20 1414 by Waunita Schooner, RN   Charge Capture section accepted

## 2020-03-31 NOTE — Interval H&P Note (Signed)
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  The various methods of treatment have been discussed with the patient and family. After consideration of risks, benefits and other options for treatment, the patient has consented to  Procedure(s) with comments: ANTERIOR (CYSTOCELE) AND POSTERIOR REPAIR (RECTOCELE) (N/A) SPARC PROCEDURE (N/A) - TOT SLING as a surgical intervention.  The patient's history has been reviewed, patient examined, no change in status, stable for surgery.  I have reviewed the patient's chart and labs.  Questions were answered to the patient's satisfaction.     Brennan Bailey

## 2020-03-31 NOTE — Transfer of Care (Signed)
Immediate Anesthesia Transfer of Care Note  Patient: Lindsey Drake  Procedure(s) Performed: ANTERIOR (CYSTOCELE) AND POSTERIOR REPAIR (RECTOCELE) (N/A ) SPARC PROCEDURE (N/A )  Patient Location: PACU  Anesthesia Type:General  Level of Consciousness: drowsy  Airway & Oxygen Therapy: Patient Spontanous Breathing and Patient connected to face mask oxygen  Post-op Assessment: Report given to RN and Post -op Vital signs reviewed and stable  Post vital signs: Reviewed and stable  Last Vitals:  Vitals Value Taken Time  BP 127/87 03/31/20 1156  Temp 36.6 C 03/31/20 1156  Pulse 68 03/31/20 1156  Resp 16 03/31/20 1156  SpO2 97 % 03/31/20 1156    Last Pain:  Vitals:   03/31/20 1203  TempSrc:   PainSc: 10-Worst pain ever         Complications: No complications documented.
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
## 2020-03-31 NOTE — Op Note (Signed)
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  She provided exposure, dissection, suctioning, retraction, and general support and assistance during the procedure.    ANESTHESIA: General  ESTIMATED BLOOD LOSS: 24mL  COMPLICATIONS: None  DRAINS: Foley to gravity  DISPOSITION: Stable to recovery room  PROCEDURE: The vaginal mucosa beginning at the vaginal cuff and overlying the bladder, was grasped with Allis clamps and injected with a dilute Pitressin solution in the midline. A midline incision was made to the level of the urethra. The vaginal mucosa was dissected laterally from the underlying attenuated fascia. A Foley catheter was placed within the bladder and the bladder was emptied. Clear urine was noted. The obturator foramina were identified in the usual manner bilaterally and marked with a marking pen the skin and subcutaneous tissues were injected with a dilute Pitressin solution. Stab incisions were made and the TOT trochars were placed through these incisions onto the operator's finger in the vagina which was retracted and bladder medially. The vaginal tape was then placed on the trochars and reversed through these incisions. A Kelly clamp was placed under the tape and the sleeves of the tape were removed. The tape was noted to be correctly positioned underneath the urethra without twists. The excess tape was removed at  the level of the skin. Steri-Strips were applied over these small skin incisions. A typical Kelly plication was performed carefully covering the tension-free vaginal tape with thickened fascia. The bladder was plicated several sutures of 3-0 Vicryl.  A shelf of fascia was then approximated in the midline placing the bladder back in its more anatomic position.The excess vaginal mucosa was trimmed. Vaginal mucosa was then closed in the midline with interrupted sutures to the level of the vaginal cuff. The vaginal cuff was closed with Vicryl suture. Hemostasis was noted. The posterior fourchette at approximately the hymenal ring was grasped using Allis clamps. The posterior vaginal mucosa was injected in the midline with a dilute Pitressin solution. A midline incision was made through the vaginal mucosa to the level of the vaginal cuff, and the vaginal mucosa was dissected laterally exposing the underlying attenuated fascia. Peritoneum over lying fatty tissue was identified and the enterocoele sac was tied of and approximated in a purse-string manner. Beginning at the vaginal cuff the attenuated fascia was grasped laterally and approximated in the midline thickening and tightening the fascia. These sutures were carried down to the level of the perineum. The excess vaginal mucosa was trimmed. The vagina was closed with interrupted sutures beginning at the vaginal cuff and carried down toward the perineal body. The perineal body was reinforced with multiple sutures of Vicryl. The mucosa was then closed over the perineal body in a subcuticular manner. Hemostasis was noted.  Vaginal packing was placed.  Clear urine was noted in the Foley.   Elonda Husky, M.D. 03/31/2020 9:52 AM

## 2020-03-31 NOTE — Anesthesia Procedure Notes (Signed)
Procedure Name: Intubation Date/Time: 03/31/2020 7:38 AM Performed by: Yves Dill, MD Pre-anesthesia Checklist: Patient identified, Emergency Drugs available, Suction available and Patient being monitored Patient Re-evaluated:Patient Re-evaluated prior to induction Oxygen Delivery Method: Circle system utilized Preoxygenation: Pre-oxygenation with 100% oxygen Induction Type: IV induction Ventilation: Mask ventilation without difficulty Laryngoscope Size: McGraph and 3 Grade View: Grade I Tube type: Oral Tube size: 7.0 mm Number of attempts: 1 Airway Equipment and Method: Stylet and Oral airway Placement Confirmation: ETT inserted through vocal cords under direct vision,  positive ETCO2 and breath sounds checked- equal and bilateral Secured at: 24 cm Tube secured with: Tape Dental Injury: Teeth and Oropharynx as per pre-operative assessment

## 2020-04-01 DIAGNOSIS — R339 Retention of urine, unspecified: Secondary | ICD-10-CM | POA: Diagnosis not present

## 2020-04-01 DIAGNOSIS — N812 Incomplete uterovaginal prolapse: Secondary | ICD-10-CM | POA: Diagnosis not present

## 2020-04-01 MED ORDER — METOPROLOL SUCCINATE ER 25 MG PO TB24
25.0000 mg | ORAL_TABLET | Freq: Every day | ORAL | Status: DC
  Administered 2020-04-01: 25 mg via ORAL
  Filled 2020-04-01: qty 1

## 2020-04-01 MED ORDER — NITROFURANTOIN MONOHYD MACRO 100 MG PO CAPS: 100.0000 mg | ORAL_CAPSULE | ORAL | 0 refills | Status: AC

## 2020-04-01 NOTE — Discharge Instructions (Signed)
Patient to start bladder training on Saturday at 8 AM (04/05/20). Patient to clamp foley for 2 hours at a time throughout the day (pt to unclamp foley at 2 hour mark long enough for bladder to completely drain then immediately reclamp) then leave foley unclamped all night. Start clamping every 2 hours again Sunday morning at 8 AM (04/06/20) then leave foley unclamped Sunday night. Patient to clamp foley 1 hour before appointment Monday morning so voiding trial can be done in office. MD to remove foley in office on Monday morning.

## 2020-04-01 NOTE — Progress Notes (Signed)
Patient discharged, Patient discharged, instructions and follow up appointment given to patient.  Patient verbalized understanding.  Patient taken out via wheelchair.

## 2020-04-01 NOTE — Progress Notes (Signed)
Possible clot visualized under/at urethra during in and out catheter. Few clots visualized in back of vagina as well. MD notified. No new orders given. Will continue to assess.   Pt only able to void 25 ml at 1105. Bladder scan showed post void residual of 750 ml. In and out cath done and got out 800 ml of urine. MD notified. Orders given to do voiding trail again in about 21 hours.    Inge Rise, RN

## 2020-04-01 NOTE — Discharge Summary (Signed)
Discharge Summary  Admit date: 03/31/2020  Discharge Date and Time:04/01/2020  5:10 PM  Discharge to:  Home  Admission Diagnosis: Present on Admission: **None**                   Pelvic relaxation.     Cystocele   Rectocele   SUI Discharge  Diagnoses: Active Problems:   Post-operative state   Same as Above    Urinary retention  OR Procedures:   Procedure(s): ANTERIOR (CYSTOCELE) AND POSTERIOR REPAIR (RECTOCELE) SPARC PROCEDURE Date -------------------                              Discharge Day Progress Note:   Subjective:   The patient does not have complaints.  She is ambulating well. She is taking PO well. Her pain is well controlled with her current medications. She is urinating without difficulty and is passing flatus.   Objective:  BP 133/78 (BP Location: Right Arm)   Pulse 70   Temp 98 F (36.7 C) (Oral)   Resp 18   SpO2 97% Comment: Room Air    Abdomen:                         clean, dry, no drainage    Assessment:   Doing well.  Normal progress as expected.   Incomplete bladder emptying   Plan:        Discharge home.                       Medications as directed.  Macrobid daily   Hospital Course:  No notes on file  Pt did very well.  Minimal pain.  Pt voiding but incomplete emptying  Condition at Discharge:  good Discharge Medications:  Allergies as of 04/01/2020   No Known Allergies     Medication List    TAKE these medications   atorvastatin 40 MG tablet Commonly known as: LIPITOR TAKE 1 TABLET(40 MG) BY MOUTH DAILY   buPROPion 300 MG 24 hr tablet Commonly known as: Wellbutrin XL Take 1 tablet (300 mg total) by mouth daily.   busPIRone 5 MG tablet Commonly known as: BUSPAR Take 1-3 tablets (5-15 mg total) by mouth 2 (two) times daily.   escitalopram 10 MG tablet Commonly known as: Lexapro 1/2 tab daily for 1 week, then increase to 1 tab daily   ibuprofen 200 MG tablet Commonly known as: ADVIL Take 200 mg by mouth every 6  (six) hours as needed.   lisinopril 10 MG tablet Commonly known as: ZESTRIL TAKE 1 TABLET(10 MG) BY MOUTH DAILY   LORazepam 0.5 MG tablet Commonly known as: Ativan Take 1 tablet (0.5 mg total) by mouth daily as needed for anxiety.   metoprolol succinate 25 MG 24 hr tablet Commonly known as: TOPROL-XL Take 1 tablet (25 mg total) by mouth daily.   multivitamin tablet Take 1 tablet by mouth daily.   nitrofurantoin (macrocrystal-monohydrate) 100 MG capsule Commonly known as: Macrobid Take 1 capsule (100 mg total) by mouth 1 day or 1 dose.   OSTEO BI-FLEX REGULAR STRENGTH PO Take by mouth.            Discharge Care Instructions  (From admission, onward)         Start     Ordered   04/01/20 0000  No dressing needed       Comments:  Keep wound area clean and dry as directed   04/01/20 1710           Follow Up:    Follow-up Information    Linzie Collin, MD. Go on 04/07/2020.   Specialties: Obstetrics and Gynecology, Radiology Why: Follow up appointment on Monday November 15th at 10:00 AM with Dr. Ralph Dowdy information: [address] [place] [address] [phone]               Elonda Husky, M.D. 04/01/2020 5:10 PM

## 2020-04-03 ENCOUNTER — Telehealth: Payer: Self-pay

## 2020-04-03 NOTE — Telephone Encounter (Signed)
Please advise 

## 2020-04-03 NOTE — Telephone Encounter (Signed)
Patient called in stating that she needs pain medications as she stated she just had surgery and is in a lot of discomfort. Could you please advise?

## 2020-04-04 ENCOUNTER — Ambulatory Visit: Payer: 59 | Admitting: Family Medicine

## 2020-04-07 ENCOUNTER — Encounter: Payer: Self-pay | Admitting: Obstetrics and Gynecology

## 2020-04-07 ENCOUNTER — Other Ambulatory Visit: Payer: Self-pay

## 2020-04-07 ENCOUNTER — Ambulatory Visit (INDEPENDENT_AMBULATORY_CARE_PROVIDER_SITE_OTHER): Payer: 59 | Admitting: Obstetrics and Gynecology

## 2020-04-07 VITALS — BP 124/88 | HR 96 | Ht 62.0 in | Wt 181.8 lb

## 2020-04-07 DIAGNOSIS — R339 Retention of urine, unspecified: Secondary | ICD-10-CM

## 2020-04-07 DIAGNOSIS — Z9889 Other specified postprocedural states: Secondary | ICD-10-CM
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
## 2020-05-08 ENCOUNTER — Encounter: Payer: 59 | Admitting: Obstetrics and Gynecology

## 2020-05-13 ENCOUNTER — Ambulatory Visit (INDEPENDENT_AMBULATORY_CARE_PROVIDER_SITE_OTHER): Payer: 59 | Admitting: Obstetrics and Gynecology

## 2020-05-13 ENCOUNTER — Other Ambulatory Visit: Payer: Self-pay

## 2020-05-13 ENCOUNTER — Encounter: Payer: Self-pay | Admitting: Obstetrics and Gynecology

## 2020-05-13 VITALS — BP 144/92 | Ht 62.0 in | Wt 185.2 lb

## 2020-05-13 DIAGNOSIS — Z9889 Other specified postprocedural states: Secondary | ICD-10-CM
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
## 2020-05-29 ENCOUNTER — Other Ambulatory Visit: Payer: Self-pay

## 2020-05-29 ENCOUNTER — Encounter: Payer: Self-pay | Admitting: Orthopedic Surgery

## 2020-05-30 ENCOUNTER — Other Ambulatory Visit
Admission: RE | Admit: 2020-05-30 | Discharge: 2020-05-30 | Disposition: A | Discharge status: Home / Self Care | Payer: 59 | Source: Ambulatory Visit | Attending: Orthopedic Surgery | Admitting: Orthopedic Surgery

## 2020-05-30 ENCOUNTER — Other Ambulatory Visit: Payer: 59

## 2020-05-30 DIAGNOSIS — Z01812 Encounter for preprocedural laboratory examination: Secondary | ICD-10-CM | POA: Diagnosis present

## 2020-05-30 DIAGNOSIS — Z20822 Contact with and (suspected) exposure to covid-19: Secondary | ICD-10-CM | POA: Diagnosis not present

## 2020-05-30 LAB — SARS CORONAVIRUS 2 (TAT 6-24 HRS): SARS Coronavirus 2: NEGATIVE

## 2020-06-02 ENCOUNTER — Other Ambulatory Visit: Payer: 59

## 2020-06-02 ENCOUNTER — Other Ambulatory Visit: Payer: Self-pay | Admitting: Orthopedic Surgery

## 2020-06-03 ENCOUNTER — Ambulatory Visit: Payer: 59 | Admitting: Anesthesiology

## 2020-06-03 ENCOUNTER — Encounter
Admission: RE | Disposition: A | Discharge status: Home / Self Care | Payer: Self-pay | Source: Home / Self Care | Attending: Orthopedic Surgery

## 2020-06-03 ENCOUNTER — Other Ambulatory Visit: Payer: Self-pay

## 2020-06-03 ENCOUNTER — Encounter: Payer: Self-pay | Admitting: Orthopedic Surgery

## 2020-06-03 ENCOUNTER — Ambulatory Visit
Admission: RE | Admit: 2020-06-03 | Discharge: 2020-06-03 | Disposition: A | Discharge status: Home / Self Care | Payer: 59 | Attending: Orthopedic Surgery | Admitting: Orthopedic Surgery

## 2020-06-03 DIAGNOSIS — Z79899 Other long term (current) drug therapy: Secondary | ICD-10-CM | POA: Diagnosis not present

## 2020-06-03 DIAGNOSIS — Z8249 Family history of ischemic heart disease and other diseases of the circulatory system: Secondary | ICD-10-CM | POA: Diagnosis not present

## 2020-06-03 DIAGNOSIS — M7542 Impingement syndrome of left shoulder: Secondary | ICD-10-CM | POA: Diagnosis not present

## 2020-06-03 DIAGNOSIS — M19012 Primary osteoarthritis, left shoulder: Secondary | ICD-10-CM | POA: Diagnosis not present

## 2020-06-03 DIAGNOSIS — Z833 Family history of diabetes mellitus: Secondary | ICD-10-CM | POA: Insufficient documentation

## 2020-06-03 DIAGNOSIS — W19XXXA Unspecified fall, initial encounter: Secondary | ICD-10-CM | POA: Diagnosis not present

## 2020-06-03 DIAGNOSIS — M75102 Unspecified rotator cuff tear or rupture of left shoulder, not specified as traumatic: Secondary | ICD-10-CM | POA: Insufficient documentation

## 2020-06-03 DIAGNOSIS — M7582 Other shoulder lesions, left shoulder: Secondary | ICD-10-CM | POA: Insufficient documentation

## 2020-06-03 DIAGNOSIS — Y939 Activity, unspecified: Secondary | ICD-10-CM | POA: Insufficient documentation

## 2020-06-03 HISTORY — DX: Motion sickness, initial encounter: T75.3XXA

## 2020-06-03 HISTORY — DX: Presence of spectacles and contact lenses: Z97.3

## 2020-06-03 HISTORY — PX: SHOULDER ARTHROSCOPY WITH SUBACROMIAL DECOMPRESSION AND OPEN ROTATOR C: SHX5688

## 2020-06-03 SURGERY — SHOULDER ARTHROSCOPY WITH SUBACROMIAL DECOMPRESSION AND OPEN ROTATOR CUFF REPAIR, OPEN BICEPS TENDON REPAIR
Anesthesia: Regional | Site: Shoulder | Laterality: Left

## 2020-06-03 MED ORDER — ASPIRIN EC 325 MG PO TBEC: 325.0000 mg | DELAYED_RELEASE_TABLET | Freq: Every day | ORAL | 0 refills | Status: AC

## 2020-06-03 MED ORDER — ACETAMINOPHEN 500 MG PO TABS: 1000.0000 mg | ORAL_TABLET | Freq: Three times a day (TID) | ORAL | 2 refills | Status: AC

## 2020-06-03 MED ORDER — MIDAZOLAM HCL 5 MG/5ML IJ SOLN
INTRAMUSCULAR | Status: DC | PRN
  Administered 2020-06-03: 2 mg via INTRAVENOUS

## 2020-06-03 MED ORDER — LACTATED RINGERS IR SOLN
Status: DC | PRN
  Administered 2020-06-03 (×10): 3000 mL

## 2020-06-03 MED ORDER — EPHEDRINE SULFATE 50 MG/ML IJ SOLN
INTRAMUSCULAR | Status: DC | PRN
  Administered 2020-06-03: 10 mg via INTRAVENOUS
  Administered 2020-06-03 (×2): 5 mg via INTRAVENOUS
  Administered 2020-06-03 (×2): 10 mg via INTRAVENOUS
  Administered 2020-06-03 (×2): 5 mg via INTRAVENOUS

## 2020-06-03 MED ORDER — CEFAZOLIN SODIUM-DEXTROSE 2-4 GM/100ML-% IV SOLN
2.0000 g | INTRAVENOUS | Status: AC
  Administered 2020-06-03: 2 g via INTRAVENOUS

## 2020-06-03 MED ORDER — PROPOFOL 10 MG/ML IV BOLUS
INTRAVENOUS | Status: DC | PRN
  Administered 2020-06-03: 200 mg via INTRAVENOUS

## 2020-06-03 MED ORDER — BUPIVACAINE LIPOSOME 1.3 % IJ SUSP
INTRAMUSCULAR | Status: DC | PRN
  Administered 2020-06-03: 20 mL via PERINEURAL

## 2020-06-03 MED ORDER — DEXAMETHASONE SODIUM PHOSPHATE 4 MG/ML IJ SOLN
INTRAMUSCULAR | Status: DC | PRN
  Administered 2020-06-03: 4 mg via INTRAVENOUS

## 2020-06-03 MED ORDER — LACTATED RINGERS IV SOLN: INTRAVENOUS | Status: DC

## 2020-06-03 MED ORDER — FENTANYL CITRATE (PF) 100 MCG/2ML IJ SOLN: 25.0000 ug | INTRAMUSCULAR | Status: DC | PRN

## 2020-06-03 MED ORDER — LIDOCAINE HCL (CARDIAC) PF 100 MG/5ML IV SOSY
PREFILLED_SYRINGE | INTRAVENOUS | Status: DC | PRN
  Administered 2020-06-03: 50 mg via INTRATRACHEAL

## 2020-06-03 MED ORDER — GLYCOPYRROLATE 0.2 MG/ML IJ SOLN
INTRAMUSCULAR | Status: DC | PRN
  Administered 2020-06-03: .1 mg via INTRAVENOUS

## 2020-06-03 MED ORDER — ONDANSETRON 4 MG PO TBDP: 4.0000 mg | ORAL_TABLET | Freq: Three times a day (TID) | ORAL | 0 refills | Status: DC | PRN

## 2020-06-03 MED ORDER — SCOPOLAMINE 1 MG/3DAYS TD PT72
1.0000 | MEDICATED_PATCH | TRANSDERMAL | Status: DC
  Administered 2020-06-03: 1.5 mg via TRANSDERMAL

## 2020-06-03 MED ORDER — OXYCODONE HCL 5 MG/5ML PO SOLN: 5.0000 mg | Freq: Once | ORAL | Status: DC | PRN

## 2020-06-03 MED ORDER — BUPIVACAINE HCL (PF) 0.5 % IJ SOLN
INTRAMUSCULAR | Status: DC | PRN
  Administered 2020-06-03: 20 mL via PERINEURAL

## 2020-06-03 MED ORDER — OXYCODONE HCL 5 MG PO TABS: 5.0000 mg | ORAL_TABLET | ORAL | 0 refills | Status: DC | PRN

## 2020-06-03 MED ORDER — ONDANSETRON HCL 4 MG/2ML IJ SOLN
INTRAMUSCULAR | Status: DC | PRN
  Administered 2020-06-03: 4 mg via INTRAVENOUS

## 2020-06-03 MED ORDER — OXYCODONE HCL 5 MG PO TABS: 5.0000 mg | ORAL_TABLET | Freq: Once | ORAL | Status: DC | PRN

## 2020-06-03 MED ORDER — LACTATED RINGERS IV SOLN: INTRAVENOUS | Status: DC | PRN

## 2020-06-03 MED ORDER — FENTANYL CITRATE (PF) 100 MCG/2ML IJ SOLN
INTRAMUSCULAR | Status: DC | PRN
  Administered 2020-06-03: 50 ug via INTRAVENOUS

## 2020-06-03 SURGICAL SUPPLY — 64 items
ADAPTER IRRIG TUBE 2 SPIKE SOL (ADAPTER) ×4 IMPLANT
ADH SKN CLS APL DERMABOND .7 (GAUZE/BANDAGES/DRESSINGS) ×1
ADPR TBG 2 SPK PMP STRL ASCP (ADAPTER) ×2
ANCH SUT 2 SWLK 19.1 CLS EYLT (Anchor) ×1 IMPLANT
ANCH SUT SHRT 12.5 CANN EYLT (Anchor) ×1 IMPLANT
ANCHOR ICONIX SPEED 2.3 (Anchor) ×2 IMPLANT
ANCHOR SUT BIOCOMP LK 2.9X12.5 (Anchor) ×2 IMPLANT
ANCHOR SWIVELOCK BIO 4.75X19.1 (Anchor) ×2 IMPLANT
APL PRP STRL LF DISP 70% ISPRP (MISCELLANEOUS) ×1
BUR BR 5.5 12 FLUTE (BURR) ×2 IMPLANT
BUR RADIUS 4.0X18.5 (BURR) ×2 IMPLANT
CANNULA PART THRD DISP 5.75X7 (CANNULA) ×4 IMPLANT
CANNULA PARTIAL THREAD 2X7 (CANNULA) ×2 IMPLANT
CHLORAPREP W/TINT 26 (MISCELLANEOUS) ×2 IMPLANT
COOLER POLAR GLACIER W/PUMP (MISCELLANEOUS) ×2 IMPLANT
COVER LIGHT HANDLE UNIVERSAL (MISCELLANEOUS) ×4 IMPLANT
DERMABOND ADVANCED (GAUZE/BANDAGES/DRESSINGS) ×1
DERMABOND ADVANCED .7 DNX12 (GAUZE/BANDAGES/DRESSINGS) ×1 IMPLANT
DRAPE IMP U-DRAPE 54X76 (DRAPES) ×4 IMPLANT
DRAPE INCISE IOBAN 66X45 STRL (DRAPES) ×2 IMPLANT
DRAPE SHEET LG 3/4 BI-LAMINATE (DRAPES) ×2 IMPLANT
DRAPE U-SHAPE 48X52 POLY STRL (PACKS) ×4 IMPLANT
DRSG TEGADERM 4X4.75 (GAUZE/BANDAGES/DRESSINGS) ×6 IMPLANT
ELECT REM PT RETURN 9FT ADLT (ELECTROSURGICAL) ×2
ELECTRODE REM PT RTRN 9FT ADLT (ELECTROSURGICAL) ×1 IMPLANT
GAUZE SPONGE 4X4 12PLY STRL (GAUZE/BANDAGES/DRESSINGS) ×2 IMPLANT
GAUZE XEROFORM 1X8 LF (GAUZE/BANDAGES/DRESSINGS) ×2 IMPLANT
GLOVE BIOGEL PI IND STRL 8 (GLOVE) ×1 IMPLANT
GLOVE BIOGEL PI INDICATOR 8 (GLOVE) ×1
GOWN STRL REIN 2XL XLG LVL4 (GOWN DISPOSABLE) ×2 IMPLANT
GOWN STRL REUS W/ TWL LRG LVL3 (GOWN DISPOSABLE) ×1 IMPLANT
GOWN STRL REUS W/TWL LRG LVL3 (GOWN DISPOSABLE) ×2
IV LACTATED RINGER IRRG 3000ML (IV SOLUTION) ×24
IV LR IRRIG 3000ML ARTHROMATIC (IV SOLUTION) ×12 IMPLANT
KIT INSERTION 2.9 PUSHLOCK (KITS) ×2 IMPLANT
KIT STABILIZATION SHOULDER (MISCELLANEOUS) ×2 IMPLANT
KIT TURNOVER KIT A (KITS) ×2 IMPLANT
MANIFOLD 4PT FOR NEPTUNE1 (MISCELLANEOUS) ×2 IMPLANT
MASK FACE SPIDER DISP (MASK) ×2 IMPLANT
MAT ABSORB  FLUID 56X50 GRAY (MISCELLANEOUS) ×4
MAT ABSORB FLUID 56X50 GRAY (MISCELLANEOUS) ×2 IMPLANT
NDL SAFETY ECLIPSE 18X1.5 (NEEDLE) ×1 IMPLANT
NEEDLE HYPO 18GX1.5 SHARP (NEEDLE) ×2
PACK ARTHROSCOPY SHOULDER (MISCELLANEOUS) ×2 IMPLANT
PAD WRAPON POLAR SHDR UNIV (MISCELLANEOUS) ×1 IMPLANT
PASSER SUT FIRSTPASS SELF (INSTRUMENTS) ×2 IMPLANT
PASSER SUT SWIFTSTITCH HIP CRT (INSTRUMENTS) ×2 IMPLANT
PENCIL SMOKE EVACUATOR (MISCELLANEOUS) ×2 IMPLANT
SET TUBE SUCT SHAVER OUTFL 24K (TUBING) ×2 IMPLANT
SET TUBE TIP INTRA-ARTICULAR (MISCELLANEOUS) ×2 IMPLANT
SUT ETHILON 3-0 (SUTURE) ×2 IMPLANT
SUT MNCRL 4-0 (SUTURE) ×2
SUT MNCRL 4-0 27XMFL (SUTURE) ×1
SUT VIC AB 0 CT1 36 (SUTURE) ×2 IMPLANT
SUT VIC AB 2-0 CT2 27 (SUTURE) ×2 IMPLANT
SUTURE MNCRL 4-0 27XMF (SUTURE) ×1 IMPLANT
SUTURE TAPE FIBERLINK 1.3 LOOP (SUTURE) ×2 IMPLANT
SUTURETAPE FIBERLINK 1.3 LOOP (SUTURE) ×4
SYR 10ML LL (SYRINGE) ×2 IMPLANT
TAPE MICROFOAM 4IN (TAPE) ×2 IMPLANT
TUBING ARTHRO INFLOW-ONLY STRL (TUBING) ×2 IMPLANT
TUBING CONNECTING 10 (TUBING) ×2 IMPLANT
WAND WEREWOLF FLOW 90D (MISCELLANEOUS) ×2 IMPLANT
WRAPON POLAR PAD SHDR UNIV (MISCELLANEOUS) ×2

## 2020-06-03 NOTE — Transfer of Care (Signed)
Immediate Anesthesia Transfer of Care Note  Patient: Lindsey Drake  Procedure(s) Performed: Left shoulder arthroscopy, rotator cuff repair, biceps tenodesis, distal clavicle excision, subacromial decompression (Left Shoulder)  Patient Location: PACU  Anesthesia Type: General, Regional  Level of Consciousness: awake, alert  and patient cooperative  Airway and Oxygen Therapy: Patient Spontanous Breathing and Patient connected to supplemental oxygen  Post-op Assessment: Post-op Vital signs reviewed, Patient's Cardiovascular Status Stable, Respiratory Function Stable, Patent Airway and No signs of Nausea or vomiting  Post-op Vital Signs: Reviewed and stable  Complications: No complications documented.

## 2020-06-03 NOTE — Discharge Instructions (Signed)
Post-Op Instructions - Rotator Cuff Repair  1. Bracing: You will wear a shoulder immobilizer or sling for 4 weeks.   2. Driving: No driving for 3 weeks post-op. When driving, do not wear the immobilizer. Ideally, we recommend no driving for 4 weeks while sling is in place as one arm will be immobilized.   3. Activity: No active lifting for 2 months. Wrist, hand, and elbow motion only. Avoid lifting the upper arm away from the body except for hygiene. You are permitted to bend and straighten the elbow passively only (no active elbow motion). You may use your hand and wrist for typing, writing, and managing utensils (cutting food). Do not lift more than a coffee cup for 8 weeks.  When sleeping or resting, inclined positions (recliner chair or wedge pillow) and a pillow under the forearm for support may provide better comfort for up to 4 weeks.  Avoid long distance travel for 4 weeks.  Return to normal activities after rotator cuff repair repair normally takes 6 months on average. If rehab goes very well, may be able to do most activities at 4 months, except overhead or contact sports.  4. Physical Therapy: Begins 3-7 days after surgery, and proceed 1 time per week for the first 4-6 weeks, then 1-2 times per week until ~20 weeks post-op.  5. Medications:  - You will be provided a prescription for narcotic pain medicine. After surgery, take 1-2 narcotic tablets every 4 hours if needed for severe pain.  - A prescription for anti-nausea medication will be provided in case the narcotic medicine causes nausea - take 1 tablet every 6 hours only if nauseated.   - Take tylenol 1000 mg (2 Extra Strength tablets or 3 regular strength) every 8 hours for pain.  May decrease or stop tylenol 5 days after surgery if you are having minimal pain. - Take ASA 325mg /day x 2 weeks to help prevent DVTs/PEs (blood clots).  - DO NOT take ANY nonsteroidal anti-inflammatory pain medications (Advil, Motrin, Ibuprofen, Aleve,  Naproxen, or Naprosyn). These medicines can inhibit healing of your shoulder repair.    If you are taking prescription medication for anxiety, depression, insomnia, muscle spasm, chronic pain, or for attention deficit disorder, you are advised that you are at a higher risk of adverse effects with use of narcotics post-op, including narcotic addiction/dependence, depressed breathing, death. If you use non-prescribed substances: alcohol, marijuana, cocaine, heroin, methamphetamines, etc., you are at a higher risk of adverse effects with use of narcotics post-op, including narcotic addiction/dependence, depressed breathing, death. You are advised that taking > 50 morphine milligram equivalents (MME) of narcotic pain medication per day results in twice the risk of overdose or death. For your prescription provided: oxycodone 5 mg - taking more than 6 tablets per day would result in > 50 morphine milligram equivalents (MME) of narcotic pain medication. Be advised that we will prescribe narcotics short-term, for acute post-operative pain only - 3 weeks for major operations such as shoulder repair/reconstruction surgeries.     6. Post-Op Appointment:  Your first post-op appointment will be 10-14 days post-op.  7. Work or School: For most, but not all procedures, we advise staying out of work or school for at least 1 to 2 weeks in order to recover from the stress of surgery and to allow time for healing.   If you need a work or school note this can be provided.   8. Smoking: If you are a smoker, you need to refrain from  smoking in the postoperative period. The nicotine in cigarettes will inhibit healing of your shoulder repair and decrease the chance of successful repair. Similarly, nicotine containing products (gum, patches) should be avoided.   Post-operative Brace: Apply and remove the brace you received as you were instructed to at the time of fitting and as described in detail as the braces  instructions for use indicate.  Wear the brace for the period of time prescribed by your physician.  The brace can be cleaned with soap and water and allowed to air dry only.  Should the brace result in increased pain, decreased feeling (numbness/tingling), increased swelling or an overall worsening of your medical condition, please contact your doctor immediately.  If an emergency situation occurs as a result of wearing the brace after normal business hours, please dial 911 and seek immediate medical attention.  Let your doctor know if you have any further questions about the brace issued to you. Refer to the shoulder sling instructions for use if you have any questions regarding the correct fit of your shoulder sling.  [place]ooting: [phone]  Video that illustrates how to properly use a shoulder sling: "Instructions for Proper Use of an Orthopaedic Sling" [email or web address]         Information for Discharge Teaching: EXPAREL (bupivacaine liposome injectable suspension)   Your surgeon or anesthesiologist gave you EXPAREL(bupivacaine) to help control your pain after surgery.   EXPAREL is a local anesthetic that provides pain relief by numbing the tissue around the surgical site.  EXPAREL is designed to release pain medication over time and can control pain for up to 72 hours.  Depending on how you respond to EXPAREL, you may require less pain medication during your recovery.  Possible side effects:  Temporary loss of sensation or ability to move in the area where bupivacaine was injected.  Nausea, vomiting, constipation  Rarely, numbness and tingling in your mouth or lips, lightheadedness, or anxiety may occur.  Call your doctor right away if you think you may be experiencing any of these sensations, or if you have other questions regarding possible side effects.  Follow all other discharge instructions given to you by your  surgeon or nurse. Eat a healthy diet and drink plenty of water or other fluids.  If you return to the hospital for any reason within 96 hours following the administration of EXPAREL, it is important for health care providers to know that you have received this anesthetic. A teal colored band has been placed on your arm with the date, time and amount of EXPAREL you have received in order to alert and inform your health care providers. Please leave this armband in place for the full 96 hours following administration, and then you may remove the band.  Scopolamine skin patches REMOVE PATCH IN 72 HOURS AND [place] HANDS IMMEDIATELY What is this medicine? SCOPOLAMINE (skoe POL a meen) is used to prevent nausea and vomiting caused by motion sickness, anesthesia and surgery. This medicine may be used for other purposes; ask your health care provider or pharmacist if you have questions. COMMON BRAND NAME(S): Transderm Scop What should I tell my health care provider before I take this medicine? They need to know if you have any of these conditions:  are scheduled to have a gastric secretion test  glaucoma  heart disease  kidney disease  liver disease  lung or breathing disease, like asthma  mental illness  prostate disease  seizures  stomach or intestine problems  trouble passing urine  an unusual or allergic reaction to scopolamine, atropine, other medicines, foods, dyes, or preservatives  pregnant or trying to get pregnant  breast-feeding How should I use this medicine? This medicine is for external use only. Follow the directions on the prescription label. Wear only 1 patch at a time. Choose an area behind the ear, that is clean, dry, hairless and free from any cuts or irritation. Wipe the area with a clean dry tissue. Peel off the plastic backing of the skin patch, trying not to touch the adhesive side with your hands. Do not cut the patches. Firmly apply to the area you have chosen,  with the metallic side of the patch to the skin and the tan-colored side showing. Once firmly in place, wash your hands well with soap and water. Do not get this medicine into your eyes. After removing the patch, wash your hands and the area behind your ear thoroughly with soap and water. The patch will still contain some medicine after use. To avoid accidental contact or ingestion by children or pets, fold the used patch in half with the sticky side together and throw away in the trash out of the reach of children and pets. If you need to use a second patch after you remove the first, place it behind the other ear. A special MedGuide will be given to you by the pharmacist with each prescription and refill. Be sure to read this information carefully each time. Talk to your pediatrician regarding the use of this medicine in children. Special care may be needed. Overdosage: If you think you have taken too much of this medicine contact a poison control center or emergency room at once. NOTE: This medicine is only for you. Do not share this medicine with others. What if I miss a dose? This does not apply. This medicine is not for regular use. What may interact with this medicine?  alcohol  antihistamines for allergy cough and cold  atropine  certain medicines for anxiety or sleep  certain medicines for bladder problems like oxybutynin, tolterodine  certain medicines for depression like amitriptyline, fluoxetine, sertraline  certain medicines for stomach problems like dicyclomine, hyoscyamine  certain medicines for Parkinson's disease like benztropine, trihexyphenidyl  certain medicines for seizures like phenobarbital, primidone  general anesthetics like halothane, isoflurane, methoxyflurane, propofol  ipratropium  local anesthetics like lidocaine, pramoxine, tetracaine  medicines that relax muscles for surgery  phenothiazines like chlorpromazine, mesoridazine, prochlorperazine,  thioridazine  narcotic medicines for pain  other belladonna alkaloids This list may not describe all possible interactions. Give your health care provider a list of all the medicines, herbs, non-prescription drugs, or dietary supplements you use. Also tell them if you smoke, drink alcohol, or use illegal drugs. Some items may interact with your medicine. What should I watch for while using this medicine? Limit contact with water while swimming and bathing because the patch may fall off. If the patch falls off, throw it away and put a new one behind the other ear. You may get drowsy or dizzy. Do not drive, use machinery, or do anything that needs mental alertness until you know how this medicine affects you. Do not stand or sit up quickly, especially if you are an older patient. This reduces the risk of dizzy or fainting spells. Alcohol may interfere with the effect of this medicine. Avoid alcoholic drinks. Your mouth may get dry. Chewing sugarless gum or sucking hard candy, and drinking plenty of water may help. Contact  your healthcare professional if the problem does not go away or is severe. This medicine may cause dry eyes and blurred vision. If you wear contact lenses, you may feel some discomfort. Lubricating drops may help. See your healthcare professional if the problem does not go away or is severe. If you are going to need surgery, an MRI, CT scan, or other procedure, tell your healthcare professional that you are using this medicine. You may need to remove the patch before the procedure. What side effects may I notice from receiving this medicine? Side effects that you should report to your doctor or health care professional as soon as possible:  allergic reactions like skin rash, itching or hives; swelling of the face, lips, or tongue  blurred vision  changes in vision  confusion  dizziness  eye pain  fast, irregular heartbeat  hallucinations, loss of contact with  reality  nausea, vomiting  pain or trouble passing urine  restlessness  seizures  skin irritation  stomach pain Side effects that usually do not require medical attention (report to your doctor or health care professional if they continue or are bothersome):  drowsiness  dry mouth  headache  sore throat This list may not describe all possible side effects. Call your doctor for medical advice about side effects. You may report side effects to FDA at 1-800-FDA-1088. Where should I keep my medicine? Keep out of the reach of children. Store at room temperature between 20 and 25 degrees C (68 and 77 degrees F). Keep this medicine in the foil package until ready to use. Throw away any unused medicine after the expiration date. NOTE: This sheet is a summary. It may not cover all possible information. If you have questions about this medicine, talk to your doctor, pharmacist, or health care provider.  2021 Elsevier/Gold Standard (2017-07-29 16:14:46)   General Anesthesia, Adult, Care After This sheet gives you information about how to care for yourself after your procedure. Your health care provider may also give you more specific instructions. If you have problems or questions, contact your health care provider. What can I expect after the procedure? After the procedure, the following side effects are common:  Pain or discomfort at the IV site.  Nausea.  Vomiting.  Sore throat.  Trouble concentrating.  Feeling cold or chills.  Weak or tired.  Sleepiness and fatigue.  Soreness and body aches. These side effects can affect parts of the body that were not involved in surgery. Follow these instructions at home:  For at least 24 hours after the procedure:  Have a responsible adult stay with you. It is important to have someone help care for you until you are awake and alert.  Rest as needed.  Do not: ? Participate in activities in which you could fall or become  injured. ? Drive. ? Use heavy machinery. ? Drink alcohol. ? Take sleeping pills or medicines that cause drowsiness. ? Make important decisions or sign legal documents. ? Take care of children on your own. Eating and drinking  Follow any instructions from your health care provider about eating or drinking restrictions.  When you feel hungry, start by eating small amounts of foods that are soft and easy to digest (bland), such as toast. Gradually return to your regular diet.  Drink enough fluid to keep your urine pale yellow.  If you vomit, rehydrate by drinking water, juice, or clear broth. General instructions  If you have sleep apnea, surgery and certain medicines can increase your  risk for breathing problems. Follow instructions from your health care provider about wearing your sleep device: ? Anytime you are sleeping, including during daytime naps. ? While taking prescription pain medicines, sleeping medicines, or medicines that make you drowsy.  Return to your normal activities as told by your health care provider. Ask your health care provider what activities are safe for you.  Take over-the-counter and prescription medicines only as told by your health care provider.  If you smoke, do not smoke without supervision.  Keep all follow-up visits as told by your health care provider. This is important. Contact a health care provider if:  You have nausea or vomiting that does not get better with medicine.  You cannot eat or drink without vomiting.  You have pain that does not get better with medicine.  You are unable to pass urine.  You develop a skin rash.  You have a fever.  You have redness around your IV site that gets worse. Get help right away if:  You have difficulty breathing.  You have chest pain.  You have blood in your urine or stool, or you vomit blood. Summary  After the procedure, it is common to have a sore throat or nausea. It is also common to  feel tired.  Have a responsible adult stay with you for the first 24 hours after general anesthesia. It is important to have someone help care for you until you are awake and alert.  When you feel hungry, start by eating small amounts of foods that are soft and easy to digest (bland), such as toast. Gradually return to your regular diet.  Drink enough fluid to keep your urine pale yellow.  Return to your normal activities as told by your health care provider. Ask your health care provider what activities are safe for you. This information is not intended to replace advice given to you by your health care provider. Make sure you discuss any questions you have with your health care provider. Document Revised: 05/13/2017 Document Reviewed: 12/24/2016 Elsevier Patient Education  2020 [employer].

## 2020-06-03 NOTE — Anesthesia Procedure Notes (Signed)
Procedure Name: LMA Insertion Date/Time: 06/03/2020 11:57 AM Performed by: Jimmy Picket, CRNA Pre-anesthesia Checklist: Patient identified, Emergency Drugs available, Suction available, Timeout performed and Patient being monitored Patient Re-evaluated:Patient Re-evaluated prior to induction Oxygen Delivery Method: Circle system utilized Preoxygenation: Pre-oxygenation with 100% oxygen Induction Type: IV induction LMA: LMA inserted LMA Size: 4.0 Number of attempts: 1 Placement Confirmation: positive ETCO2 and breath sounds checked- equal and bilateral Tube secured with: Tape

## 2020-06-03 NOTE — Anesthesia Postprocedure Evaluation (Signed)
Anesthesia Post Note  Patient: [name]  Procedure(s) Performed: Left shoulder arthroscopy, rotator cuff repair, biceps tenodesis, distal clavicle excision, subacromial decompression (Left Shoulder)     Patient location during evaluation: PACU Anesthesia Type: Regional and General Level of consciousness: awake and alert Pain management: pain level controlled Vital Signs Assessment: post-procedure vital signs reviewed and stable Respiratory status: spontaneous breathing and nonlabored ventilation Cardiovascular status: blood pressure returned to baseline Postop Assessment: no apparent nausea or vomiting Anesthetic complications: no   No complications documented.  Estha Few [employer]

## 2020-06-03 NOTE — H&P (Signed)
Paper H&P to be scanned into permanent record. H&P reviewed. No significant changes noted.  

## 2020-06-03 NOTE — Anesthesia Preprocedure Evaluation (Signed)
Anesthesia Evaluation  Patient identified by MRN, date of birth, ID band Patient awake    Reviewed: Allergy & Precautions, NPO status , Patient's Chart, lab work & pertinent test results  History of Anesthesia Complications (+) PONV and history of anesthetic complications  Airway Mallampati: III  TM Distance: <3 FB     Dental  (+) Chipped, Caps   Pulmonary neg pulmonary ROS,    Pulmonary exam normal        Cardiovascular hypertension, Normal cardiovascular exam     Neuro/Psych PSYCHIATRIC DISORDERS Anxiety Depression negative neurological ROS     GI/Hepatic Neg liver ROS, GERD  Controlled,  Endo/Other  negative endocrine ROS  Renal/GU negative Renal ROS     Musculoskeletal  (+) Arthritis , Traumatic tear of left rotator cuff   Abdominal (+) + obese,   Peds negative pediatric ROS (+)  Hematology negative hematology ROS (+)   Anesthesia Other Findings   Reproductive/Obstetrics                             Anesthesia Physical  Anesthesia Plan  ASA: II  Anesthesia Plan: General and Regional   Post-op Pain Management: GA combined w/ Regional for post-op pain   Induction: Intravenous  PONV Risk Score and Plan: 4 or greater and Ondansetron, Dexamethasone, Midazolam, Scopolamine patch - Pre-op and Treatment may vary due to age or medical condition  Airway Management Planned: LMA  Additional Equipment: None  Intra-op Plan:   Post-operative Plan: Extubation in OR  Informed Consent: I have reviewed the patients History and Physical, chart, labs and discussed the procedure including the risks, benefits and alternatives for the proposed anesthesia with the patient or authorized representative who has indicated his/her understanding and acceptance.     Dental advisory given  Plan Discussed with: CRNA  Anesthesia Plan Comments:         Anesthesia Quick Evaluation

## 2020-06-03 NOTE — Progress Notes (Signed)
Assisted Ryan Bialas ANMD with left, ultrasound guided, interscalene  block. Side rails up, monitors on throughout procedure. See vital signs in flow sheet. Tolerated Procedure well. °

## 2020-06-03 NOTE — Anesthesia Procedure Notes (Signed)
Anesthesia Regional Block: Interscalene brachial plexus block   Pre-Anesthetic Checklist: ,, timeout performed, Correct Patient, Correct Site, Correct Laterality, Correct Procedure, Correct Position, site marked, Risks and benefits discussed,  Surgical consent,  Pre-op evaluation,  At surgeon's request and post-op pain management  Laterality: Left  Prep: chloraprep       Needles:  Injection technique: Single-shot  Needle Type: Stimiplex     Needle Length: 5cm  Needle Gauge: 21     Additional Needles:   Procedures:,,,, ultrasound used (permanent image in chart),,,,  Narrative:  Start time: 06/03/2020 10:28 AM End time: 06/03/2020 10:32 AM Injection made incrementally with aspirations every 5 mL.  Performed by: Personally  Anesthesiologist: Fletcher Anon, MD  Additional Notes: Functioning IV was confirmed and monitors applied. Ultrasound guidance: relevant anatomy identified, needle position confirmed, local anesthetic spread visualized around nerve(s)., vascular puncture avoided.  Image printed for medical record.  Negative aspiration and no paresthesias; incremental administration of local anesthetic. The patient tolerated the procedure well. Vitals signes recorded in RN notes.

## 2020-06-03 NOTE — Op Note (Signed)
SURGERY DATE: 06/03/2020   PRE-OP DIAGNOSIS:  1. Left subacromial impingement 2. Left biceps tendinopathy 3. Left rotator cuff tear 4. Left acromioclavicular joint arthritis  POST-OP DIAGNOSIS: 1. Left subacromial impingement 2. Left biceps tendinopathy 3. Left rotator cuff tear 4. Left acromioclavicular joint arthritis  PROCEDURES:  1. Left arthroscopic rotator cuff repair 2. Left arthroscopic biceps tenodesis 3. Left arthroscopic subacromial decompression 4. Left arthroscopic distal clavicle excision 5. Left arthroscopic extensive debridement of shoulder (glenohumeral and subacromial spaces)  SURGEON: Rosealee Albee, MD  ANESTHESIA: Gen with interscalene block  ESTIMATED BLOOD LOSS: 5cc  DRAINS:  none  TOTAL IV FLUIDS: per anesthesia   SPECIMENS: none  IMPLANTS:   - Arthrex 2.29mm Pushlock - x1 - Arthrex 4.5mm SwiveLock - x1 - Iconix SPEED all-suture anchor double loaded with 1.87mm and 2.82mm tape x 1   OPERATIVE FINDINGS:  Examination under anesthesia: A careful examination under anesthesia was performed.  Passive range of motion was: FF: 160; ER at side: 45; ER in abduction: 90; IR in abduction: 45.  Anterior load shift: NT.  Posterior load shift: NT.  Sulcus in neutral: NT.  Sulcus in ER: NT.    Intra-operative findings: A thorough arthroscopic examination of the shoulder was performed.  The findings are: 1. Biceps tendon: tendinopathy with erythema at biceps anchor 2. Superior labrum: Type I SLAP tear 3. Posterior labrum and capsule: normal 4. Inferior capsule and inferior recess: normal 5. Glenoid cartilage surface: Normal  6. Supraspinatus attachment: Full-thickness tear of the anterior supraspinatus 7. Posterior rotator cuff attachment: normal 8. Humeral head articular cartilage: normal 9. Rotator interval: Significant synovitis 10: Subscapularis tendon: attachment intact 11. Anterior labrum: Mild degeneration 12. IGHL: normal  OPERATIVE REPORT:    Indications for procedure: Lindsey Drake is a 55 y.o. female who initially had a fall on 04/10/2020.  She had immediate pain in the left shoulder afterwards and had significant difficulty lifting her arm overhead.  Clinical exam and MRI were concerning for full-thickness rotator cuff tear, biceps tendinopathy, subacromial impingement with subacromial spur, and acromioclavicular joint arthritis. After discussion of risks, benefits, and alternatives to surgery, the patient elected to proceed.     Procedure in detail: I identified Lindsey Drake in the pre-operative holding area.  I marked the operative shoulder with my initials. I reviewed the risks and benefits of the proposed surgical intervention, and the patient (and/or patient's guardian) wished to proceed.  Anesthesia was then performed with an interscalene block.  The patient was transferred to the operative suite and placed in the beach chair position.    SCDs were placed on the lower extremities. Appropriate IV antibiotics were administered within 1 hour before incision. The operative upper extremity was then prepped and draped in standard fashion. A time out was performed confirming the correct extremity, correct patient and correct procedure.   I then created a standard posterior portal with an 11 blade. The glenohumeral joint was easily entered with a blunt trochar and the arthroscope introduced. The findings of diagnostic arthroscopy are described above. I debrided degenerative tissue including anterior labrum and also debrided and coagulated the inflamed synovium about the rotator interval to obtain hemostasis and reduce the risk of post-operative swelling using an Arthrocare radiofrequency device.   I then turned my attention to the arthroscopic biceps tenodesis.  I used the loop n tack technique to pass a fiber tape through the biceps in a locked fashion adjacent to the biceps anchor.  A hole for a 2.9 mm  Arthrex PushLock was drilled in  the bicipital groove just superior to the subscapularis tendon insertion.  The fiber tape was loaded onto the push lock anchor and impacted into place into the previously drilled hole in the bicipital groove.  This appropriately secured the biceps into the bicipital groove and took it off of tension.  Next, the arthroscope was then introduced into the subacromial space. A direct lateral portal was created with an 11-blade after spinal needle localization. An extensive subacromial bursectomy was performed using a combination of the shaver and Arthrocare wand. The entire acromial undersurface was exposed and the CA ligament was subperiosteally elevated to expose the anterior acromial hook. A burr was used to create a flat anterior and lateral aspect of the acromion, converting it from a Type 2 to a Type 1 acromion. Care was made to keep the deltoid fascia intact.  I then turned my attention to the arthroscopic distal clavicle excision. I identified the acromioclavicular joint. Surrounding bursal tissue was debrided and the edges of the joint were identified. I used the 5.82mm barrel burr to remove the distal clavicle parallel to the edge of the acromion. I was able to fit two widths of the burr into the space between the distal clavicle and acromion, signifying that I had removed ~33mm of distal clavicle. This was confirmed by viewing anteriorly and introducing a probe with measuring marks from the lateral portal. Hemostasis was achieved with an Arthrocare wand.   Then, I created an accessory posterolateral portal to assist with visualization and instrumentation.  I debrided the poor quality edges of the supraspinatus tendon.  This was a U-shaped tear of the anterior supraspinatus.  I prepared the footprint using a burr to expose bleeding bone.   I then percutaneously placed an Iconix SPEED anchor at the articular margin. I then shuttled all 4 strands of tape through the rotator cuff just lateral to the  musculotendinous junction using a suture passer. I then loaded all 4 tapes onto a 4.75 mm Arthrex SwiveLock anchor.  This was fixed approximately 1 cm distal to the lateral edge of the greater tuberosity for the lateral row fixation.. This afforded appropriate reduction of the rotator cuff tear with homogeneous compression of the tendon across the prepared footprint.  The repair was stable to external and internal rotation.  Fluid was evacuated from the shoulder, and the portals were closed with 3-0 Nylon. Xeroform was applied to the portals. A sterile dressing was applied, followed by a Polar Care sleeve and a SlingShot shoulder immobilizer/sling. The patient awoke from anesthesia without difficulty and was transferred to the PACU in stable condition.     COMPLICATIONS: none  DISPOSITION: plan for discharge home after recovery in PACU   POSTOPERATIVE PLAN: Remain in sling (except hygiene and elbow/wrist/hand RoM exercises as instructed by PT) x 4 weeks and NWB for this time. PT to begin 3-4 days after surgery.  Use small/medium rotator cuff repair rehab protocol.

## 2020-06-04 ENCOUNTER — Encounter: Payer: Self-pay | Admitting: Orthopedic Surgery

## 2020-06-12 ENCOUNTER — Other Ambulatory Visit: Payer: Self-pay | Admitting: Family Medicine

## 2020-06-14 ENCOUNTER — Other Ambulatory Visit: Payer: Self-pay | Admitting: Family Medicine

## 2020-06-15 ENCOUNTER — Other Ambulatory Visit: Payer: Self-pay | Admitting: Family Medicine

## 2020-06-15 NOTE — Telephone Encounter (Signed)
Requested Prescriptions  Pending Prescriptions Disp Refills  . lisinopril (ZESTRIL) 10 MG tablet [Pharmacy Med Name: LISINOPRIL 10MG  TABLETS] 90 tablet 0    Sig: TAKE 1 TABLET(10 MG) BY MOUTH DAILY     Cardiovascular:  ACE Inhibitors Passed - 06/15/2020 12:32 PM      Passed - Cr in normal range and within 180 days    Creatinine, Ser  Date Value Ref Range Status  01/24/2020 0.80 0.57 - 1.00 mg/dL Final         Passed - K in normal range and within 180 days    Potassium  Date Value Ref Range Status  01/24/2020 4.2 3.5 - 5.2 mmol/L Final         Passed - Patient is not pregnant      Passed - Last BP in normal range    BP Readings from Last 1 Encounters:  06/03/20 114/77         Passed - Valid encounter within last 6 months    Recent Outpatient Visits          3 months ago Generalized anxiety disorder   [facility] [place], Megan P, DO   4 months ago Essential hypertension, benign   [facility] [place], Megan P, DO   6 months ago Chronic bilateral low back pain with left-sided sciatica   [facility] [place], Megan P, DO   1 year ago Routine general medical examination at a health care facility   [facility] [place], [facility] P, DO   2 years ago Generalized anxiety disorder   [facility] [place], [place], DO

## 2020-07-04 ENCOUNTER — Other Ambulatory Visit: Payer: Self-pay | Admitting: Family Medicine

## 2020-07-15 ENCOUNTER — Other Ambulatory Visit: Payer: Self-pay | Admitting: Family Medicine

## 2020-07-23 ENCOUNTER — Other Ambulatory Visit: Payer: Self-pay | Admitting: Family Medicine

## 2020-08-12 ENCOUNTER — Encounter: Payer: Self-pay | Admitting: Obstetrics and Gynecology

## 2020-08-12 ENCOUNTER — Ambulatory Visit (INDEPENDENT_AMBULATORY_CARE_PROVIDER_SITE_OTHER): Payer: 59 | Admitting: Obstetrics and Gynecology

## 2020-08-12 ENCOUNTER — Other Ambulatory Visit: Payer: Self-pay

## 2020-08-12 VITALS — BP 132/92 | HR 108 | Ht 62.0 in | Wt 185.1 lb

## 2020-08-12 DIAGNOSIS — N8189 Other female genital prolapse: Secondary | ICD-10-CM | POA: Diagnosis not present
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
## 2021-04-09 ENCOUNTER — Other Ambulatory Visit: Payer: Self-pay

## 2021-04-09 ENCOUNTER — Ambulatory Visit (INDEPENDENT_AMBULATORY_CARE_PROVIDER_SITE_OTHER): Payer: 59 | Admitting: Family Medicine

## 2021-04-09 ENCOUNTER — Encounter: Payer: Self-pay | Admitting: Family Medicine

## 2021-04-09 ENCOUNTER — Ambulatory Visit: Payer: Self-pay | Admitting: *Deleted

## 2021-04-09 ENCOUNTER — Ambulatory Visit
Admission: RE | Admit: 2021-04-09 | Discharge: 2021-04-09 | Disposition: A | Discharge status: Home / Self Care | Payer: 59 | Source: Ambulatory Visit | Attending: Family Medicine | Admitting: Family Medicine

## 2021-04-09 VITALS — BP 126/74 | HR 102 | Temp 97.5°F | Ht 62.0 in | Wt 176.0 lb

## 2021-04-09 DIAGNOSIS — R609 Edema, unspecified: Secondary | ICD-10-CM | POA: Insufficient documentation

## 2021-04-09 DIAGNOSIS — E782 Mixed hyperlipidemia: Secondary | ICD-10-CM | POA: Diagnosis not present

## 2021-04-09 DIAGNOSIS — D72829 Elevated white blood cell count, unspecified: Secondary | ICD-10-CM

## 2021-04-09 DIAGNOSIS — Z1159 Encounter for screening for other viral diseases: Secondary | ICD-10-CM

## 2021-04-09 DIAGNOSIS — Z23 Encounter for immunization: Secondary | ICD-10-CM

## 2021-04-09 DIAGNOSIS — F411 Generalized anxiety disorder: Secondary | ICD-10-CM

## 2021-04-09 DIAGNOSIS — I10 Essential (primary) hypertension: Secondary | ICD-10-CM | POA: Diagnosis not present

## 2021-04-09 LAB — URINALYSIS, ROUTINE W REFLEX MICROSCOPIC
Bilirubin, UA: NEGATIVE
Glucose, UA: NEGATIVE
Ketones, UA: NEGATIVE
Leukocytes,UA: NEGATIVE
Nitrite, UA: NEGATIVE
Specific Gravity, UA: >1.03 — ABNORMAL HIGH (ref 1.005–1.030)
Urobilinogen, Ur: 0.2 mg/dL (ref 0.2–1.0)
pH, UA: 5 (ref 5.0–7.5)

## 2021-04-09 LAB — MICROALBUMIN, URINE WAIVED
Creatinine, Urine Waived: 300 mg/dL (ref 10–300)
Microalb, Ur Waived: 80 mg/L — ABNORMAL HIGH (ref 0–19)

## 2021-04-09 LAB — MICROSCOPIC EXAMINATION
Bacteria, UA: NONE SEEN
WBC, UA: NONE SEEN /hpf (ref 0–5)

## 2021-04-09 MED ORDER — BUSPIRONE HCL 5 MG PO TABS: 5.0000 mg | ORAL_TABLET | Freq: Two times a day (BID) | ORAL | 1 refills | Status: DC

## 2021-04-09 MED ORDER — ATORVASTATIN CALCIUM 40 MG PO TABS: ORAL_TABLET | ORAL | 1 refills | Status: DC

## 2021-04-09 MED ORDER — ESCITALOPRAM OXALATE 10 MG PO TABS: ORAL_TABLET | ORAL | 2 refills | Status: DC

## 2021-04-09 NOTE — Telephone Encounter (Signed)
Call received from Tobi Bastos, from [facility]  imaging , with venous doppler results that are negative for DVT.

## 2021-04-09 NOTE — Progress Notes (Signed)
BP 126/74   Pulse (!) 102   Temp (!) 97.5 F (36.4 C)   Ht '5\' 2"'  (1.575 m)   Wt 176 lb (79.8 kg)   SpO2 97%   BMI 32.19 kg/m    Subjective:    Patient ID: Lindsey Drake, female    DOB: 10-01-1965, 55 y.o.   MRN: [id number]  HPI: Lindsey Drake is a 55 y.o. female  Chief Complaint  Patient presents with   Edema    Patient states both her feet are swollen for about two weeks. Patient states she is having pain from her knee down in both legs   Has been having swelling in her legs for about 2 weeks. Has gotten a little better with restarting her meloxicam, but has been unable to even walk on her legs because they have been so painful. Making it hard to sleep or do anything. Has made her very anxious.  HYPERTENSION / HYPERLIPIDEMIA Satisfied with current treatment? yes- hasn't been on anything Duration of hypertension: chronic BP monitoring frequency: not checking BP medication side effects: no Past BP meds: metoprolol, lisinopril Duration of hyperlipidemia: chronic Cholesterol medication side effects: hasn't been on anything, but did well when she was on it Cholesterol supplements: none Past cholesterol medications: atorvastatin Medication compliance: poor compliance Aspirin: no Recent stressors: yes Recurrent headaches: no Visual changes: no Palpitations: no Dyspnea: no Chest pain: no Lower extremity edema: no Dizzy/lightheaded: no  ANXIETY/STRESS Duration: chronic Status:exacerbated Anxious mood: yes  Excessive worrying: yes Irritability: yes  Sweating: yes Nausea: no Palpitations:yes Hyperventilation: no Panic attacks: no Agoraphobia: no  Obscessions/compulsions: yes Depressed mood: yes Depression screen [facility] 2/9 04/09/2021 12/17/2019 11/21/2018 06/15/2018 03/20/2018  Decreased Interest '3 3 3 1 1  ' Down, Depressed, Hopeless '2 3 3 1 ' 0  PHQ - 2 Score '5 6 6 2 1  ' Altered sleeping '3 3 3 3 3  ' Tired, decreased energy '3 3 3 1 1  ' Change in appetite '3 1 2 ' 0 2   Feeling bad or failure about yourself  0 0 0 0 0  Trouble concentrating '2 2 2 ' 0 0  Moving slowly or fidgety/restless 0 0 0 0 0  Suicidal thoughts 0 0 0 0 0  PHQ-9 Score '16 15 16 6 7  ' Difficult doing work/chores - Somewhat difficult Somewhat difficult Somewhat difficult Somewhat difficult  Some recent data might be hidden   Anhedonia: no Weight changes: no Insomnia: no   Hypersomnia: no Fatigue/loss of energy: yes Feelings of worthlessness: no Feelings of guilt: yes Impaired concentration/indecisiveness: yes Suicidal ideations: no  Crying spells: no Recent Stressors/Life Changes: yes   Relationship problems: yes   Family stress: no     Financial stress: no    Job stress: no    Recent death/loss: no  Relevant past medical, surgical, family and social history reviewed and updated as indicated. Interim medical history since our last visit reviewed. Allergies and medications reviewed and updated.  Review of Systems  Constitutional: Negative.   Respiratory: Negative.    Cardiovascular:  Positive for leg swelling. Negative for chest pain and palpitations.  Gastrointestinal: Negative.   Genitourinary: Negative.   Musculoskeletal: Negative.   Neurological: Negative.   Psychiatric/Behavioral: Negative.     Per HPI unless specifically indicated above     Objective:    BP 126/74   Pulse (!) 102   Temp (!) 97.5 F (36.4 C)   Ht '5\' 2"'  (1.575 m)   Wt 176 lb (79.8 kg)  SpO2 97%   BMI 32.19 kg/m   Wt Readings from Last 3 Encounters:  04/09/21 176 lb (79.8 kg)  08/12/20 185 lb 1.6 oz (84 kg)  06/03/20 186 lb (84.4 kg)    Physical Exam Vitals and nursing note reviewed.  Constitutional:      General: She is not in acute distress.    Appearance: Normal appearance. She is not ill-appearing, toxic-appearing or diaphoretic.  HENT:     Head: Normocephalic and atraumatic.     Right Ear: External ear normal.     Left Ear: External ear normal.     Nose: Nose normal.      Mouth/Throat:     Mouth: Mucous membranes are moist.     Pharynx: Oropharynx is clear.  Eyes:     General: No scleral icterus.       Right eye: No discharge.        Left eye: No discharge.     Extraocular Movements: Extraocular movements intact.     Conjunctiva/sclera: Conjunctivae normal.     Pupils: Pupils are equal, round, and reactive to light.  Cardiovascular:     Rate and Rhythm: Normal rate and regular rhythm.     Pulses: Normal pulses.     Heart sounds: Normal heart sounds. No murmur heard.   No friction rub. No gallop.  Pulmonary:     Effort: Pulmonary effort is normal. No respiratory distress.     Breath sounds: Normal breath sounds. No stridor. No wheezing, rhonchi or rales.  Chest:     Chest wall: No tenderness.  Musculoskeletal:        General: Normal range of motion.     Cervical back: Normal range of motion and neck supple.     Comments: Tense edema bilaterally, + pain  Skin:    General: Skin is warm and dry.     Capillary Refill: Capillary refill takes less than 2 seconds.     Coloration: Skin is not jaundiced or pale.     Findings: No bruising, erythema, lesion or rash.  Neurological:     General: No focal deficit present.     Mental Status: She is alert and oriented to person, place, and time. Mental status is at baseline.  Psychiatric:        Mood and Affect: Mood normal.        Behavior: Behavior normal.        Thought Content: Thought content normal.        Judgment: Judgment normal.    Results for orders placed or performed in visit on 04/09/21  Microscopic Examination   Urine  Result Value Ref Range   WBC, UA None seen 0 - 5 /hpf   RBC 0-2 0 - 2 /hpf   Epithelial Cells (non renal) 0-10 0 - 10 /hpf   Casts Present (A) None seen /lpf   Cast Type Hyaline casts N/A   Mucus, UA Present (A) Not Estab.   Bacteria, UA None seen None seen/Few  Comprehensive metabolic panel  Result Value Ref Range   Glucose 79 70 - 99 mg/dL   BUN 14 6 - 24 mg/dL    Creatinine, Ser 0.65 0.57 - 1.00 mg/dL   eGFR 103 >59 mL/min/1.73   BUN/Creatinine Ratio 22 9 - 23   Sodium 141 134 - 144 mmol/L   Potassium 4.2 3.5 - 5.2 mmol/L   Chloride 102 96 - 106 mmol/L   CO2 23 20 - 29 mmol/L   Calcium 9.5  8.7 - 10.2 mg/dL   Total Protein 6.7 6.0 - 8.5 g/dL   Albumin 4.1 3.8 - 4.9 g/dL   Globulin, Total 2.6 1.5 - 4.5 g/dL   Albumin/Globulin Ratio 1.6 1.2 - 2.2   Bilirubin Total <0.2 0.0 - 1.2 mg/dL   Alkaline Phosphatase 96 44 - 121 IU/L   AST 44 (H) 0 - 40 IU/L   ALT 31 0 - 32 IU/L  CBC with Differential/Platelet  Result Value Ref Range   WBC 10.4 3.4 - 10.8 x10E3/uL   RBC 4.45 3.77 - 5.28 x10E6/uL   Hemoglobin 12.4 11.1 - 15.9 g/dL   Hematocrit 37.5 34.0 - 46.6 %   MCV 84 79 - 97 fL   MCH 27.9 26.6 - 33.0 pg   MCHC 33.1 31.5 - 35.7 g/dL   RDW 12.7 11.7 - 15.4 %   Platelets 421 150 - 450 x10E3/uL   Neutrophils 64 Not Estab. %   Lymphs 28 Not Estab. %   Monocytes 6 Not Estab. %   Eos 2 Not Estab. %   Basos 0 Not Estab. %   Neutrophils Absolute 6.6 1.4 - 7.0 x10E3/uL   Lymphocytes Absolute 3.0 0.7 - 3.1 x10E3/uL   Monocytes Absolute 0.6 0.1 - 0.9 x10E3/uL   EOS (ABSOLUTE) 0.2 0.0 - 0.4 x10E3/uL   Basophils Absolute 0.0 0.0 - 0.2 x10E3/uL   Immature Granulocytes 0 Not Estab. %   Immature Grans (Abs) 0.0 0.0 - 0.1 x10E3/uL  Lipid Panel w/o Chol/HDL Ratio  Result Value Ref Range   Cholesterol, Total 215 (H) 100 - 199 mg/dL   Triglycerides 266 (H) 0 - 149 mg/dL   HDL 44 >39 mg/dL   VLDL Cholesterol Cal 47 (H) 5 - 40 mg/dL   LDL Chol Calc (NIH) 124 (H) 0 - 99 mg/dL  Microalbumin, Urine Waived  Result Value Ref Range   Microalb, Ur Waived 80 (H) 0 - 19 mg/L   Creatinine, Urine Waived 300 10 - 300 mg/dL   Microalb/Creat Ratio 30-300 (H) <30 mg/g  TSH  Result Value Ref Range   TSH 1.360 0.450 - 4.500 uIU/mL  Urinalysis, Routine w reflex microscopic  Result Value Ref Range   Specific Gravity, UA >1.030 (H) 1.005 - 1.030   pH, UA 5.0 5.0 - 7.5    Color, UA Yellow Yellow   Appearance Ur Cloudy (A) Clear   Leukocytes,UA Negative Negative   Protein,UA 1+ (A) Negative/Trace   Glucose, UA Negative Negative   Ketones, UA Negative Negative   RBC, UA 1+ (A) Negative   Bilirubin, UA Negative Negative   Urobilinogen, Ur 0.2 0.2 - 1.0 mg/dL   Nitrite, UA Negative Negative   Microscopic Examination See below:   VITAMIN D 25 Hydroxy (Vit-D Deficiency, Fractures)  Result Value Ref Range   Vit D, 25-Hydroxy 38.3 30.0 - 100.0 ng/mL  B Nat Peptide  Result Value Ref Range   BNP WILL FOLLOW   Hepatitis C Antibody  Result Value Ref Range   Hep C Virus Ab 0.2 0.0 - 0.9 s/co ratio  Sed Rate (ESR)  Result Value Ref Range   Sed Rate 57 (H) 0 - 40 mm/hr  Antinuclear Antib (ANA)  Result Value Ref Range   Anti Nuclear Antibody (ANA) WILL FOLLOW   Babesia microti Antibody Panel  Result Value Ref Range   Babesia microti IgM WILL FOLLOW    Babesia microti IgG WILL FOLLOW   Ehrlichia Antibody Panel  Result Value Ref Range   E.Chaffeensis (HME)  IgG WILL FOLLOW    E. Chaffeensis (HME) IgM Titer WILL FOLLOW    HGE IgG Titer WILL FOLLOW    HGE IgM Titer WILL FOLLOW   Rocky mtn spotted fvr abs pnl(IgG+IgM)  Result Value Ref Range   RMSF IgG WILL FOLLOW    RMSF IgM WILL FOLLOW   Rheumatoid Arthritis Profile  Result Value Ref Range   Rhuematoid fact SerPl-aCnc <10.0 <35.6 IU/mL   Cyclic Citrullin Peptide Ab WILL FOLLOW   Lyme Disease Serology w/Reflex  Result Value Ref Range   Lyme Total Antibody EIA WILL FOLLOW       Assessment & Plan:   Problem List Items Addressed This Visit       Cardiovascular and Mediastinum   Essential hypertension, benign    Doing well off medicine. Will hold on restarting and continue to monitor closely. Call with any concerns.       Relevant Medications   atorvastatin (LIPITOR) 40 MG tablet   Other Relevant Orders   Comprehensive metabolic panel (Completed)   CBC with Differential/Platelet (Completed)    Microalbumin, Urine Waived (Completed)   Urinalysis, Routine w reflex microscopic (Completed)     Other   Anxiety disorder    Will restart her lexapro and recheck 1 month. May need to go back on her wellbutrin if needed.       Relevant Medications   busPIRone (BUSPAR) 5 MG tablet   escitalopram (LEXAPRO) 10 MG tablet   Elevated WBC count    Rechecking labs today. Await results. Treat as needed.       Relevant Orders   CBC with Differential/Platelet (Completed)   Mixed hyperlipidemia    Has been off her medicine. Will restart her meds and recheck 1 month. Call with any concerns. Continue to monitor.       Relevant Medications   atorvastatin (LIPITOR) 40 MG tablet   Other Relevant Orders   Lipid Panel w/o Chol/HDL Ratio (Completed)   Other Visit Diagnoses     Peripheral edema    -  Primary   Tense edema today with pain. No pitting. Will check [place] to r/o DVT. Will check labs. Await results. Treat as needed. Continue with meloxicam.    Relevant Orders   Comprehensive metabolic panel (Completed)   CBC with Differential/Platelet (Completed)   TSH (Completed)   Urinalysis, Routine w reflex microscopic (Completed)   VITAMIN D 25 Hydroxy (Vit-D Deficiency, Fractures) (Completed)   B Nat Peptide (Completed)   RA Qn+CCP(IgG/A)+SjoSSA+SjoSSB   Sed Rate (ESR) (Completed)   Antinuclear Antib (ANA) (Completed)   Lyme Disease Serology w/Reflex   Babesia microti Antibody Panel (Completed)   Ehrlichia Antibody Panel (Completed)   Rocky mtn spotted fvr abs pnl(IgG+IgM) (Completed)   US Venous Img Lower Bilateral (Completed)   Rheumatoid Arthritis Profile   Need for hepatitis C screening test       Labs drawn today. Await results.    Relevant Orders   Hepatitis C Antibody (Completed)   Need for influenza vaccination       Flu shot given today.   Relevant Orders   Flu Vaccine QUAD 6+ mos PF IM (Fluarix Quad PF) (Completed)        Follow up plan: Return in about 4 weeks (around  05/07/2021), or physical.

## 2021-04-10 ENCOUNTER — Encounter: Payer: Self-pay | Admitting: Family Medicine

## 2021-04-10 NOTE — Assessment & Plan Note (Signed)
Has been off her medicine. Will restart her meds and recheck 1 month. Call with any concerns. Continue to monitor.

## 2021-04-10 NOTE — Assessment & Plan Note (Signed)
Doing well off medicine. Will hold on restarting and continue to monitor closely. Call with any concerns.

## 2021-04-10 NOTE — Assessment & Plan Note (Signed)
Rechecking labs today. Await results. Treat as needed.  °

## 2021-04-10 NOTE — Telephone Encounter (Signed)
Routing to provider, FYI.  

## 2021-04-10 NOTE — Assessment & Plan Note (Signed)
Will restart her lexapro and recheck 1 month. May need to go back on her wellbutrin if needed.

## 2021-04-13 LAB — CBC WITH DIFFERENTIAL/PLATELET
Basophils Absolute: 0 10*3/uL (ref 0.0–0.2)
Basos: 0 %
EOS (ABSOLUTE): 0.2 10*3/uL (ref 0.0–0.4)
Eos: 2 %
Hematocrit: 37.5 % (ref 34.0–46.6)
Hemoglobin: 12.4 g/dL (ref 11.1–15.9)
Immature Grans (Abs): 0 10*3/uL (ref 0.0–0.1)
Immature Granulocytes: 0 %
Lymphocytes Absolute: 3 10*3/uL (ref 0.7–3.1)
Lymphs: 28 %
MCH: 27.9 pg (ref 26.6–33.0)
MCHC: 33.1 g/dL (ref 31.5–35.7)
MCV: 84 fL (ref 79–97)
Monocytes Absolute: 0.6 10*3/uL (ref 0.1–0.9)
Monocytes: 6 %
Neutrophils Absolute: 6.6 10*3/uL (ref 1.4–7.0)
Neutrophils: 64 %
Platelets: 421 10*3/uL (ref 150–450)
RBC: 4.45 x10E6/uL (ref 3.77–5.28)
RDW: 12.7 % (ref 11.7–15.4)
WBC: 10.4 10*3/uL (ref 3.4–10.8)

## 2021-04-13 LAB — LIPID PANEL W/O CHOL/HDL RATIO
Cholesterol, Total: 215 mg/dL — ABNORMAL HIGH (ref 100–199)
HDL: 44 mg/dL (ref >39)
LDL Chol Calc (NIH): 124 mg/dL — ABNORMAL HIGH (ref 0–99)
Triglycerides: 266 mg/dL — ABNORMAL HIGH (ref 0–149)
VLDL Cholesterol Cal: 47 mg/dL — ABNORMAL HIGH (ref 5–40)

## 2021-04-13 LAB — ROCKY MTN SPOTTED FVR ABS PNL(IGG+IGM)
RMSF IgG: NEGATIVE
RMSF IgM: 0.53 index (ref 0.00–0.89)

## 2021-04-13 LAB — EHRLICHIA ANTIBODY PANEL
E. Chaffeensis (HME) IgM Titer: NEGATIVE
E.Chaffeensis (HME) IgG: NEGATIVE
HGE IgG Titer: NEGATIVE
HGE IgM Titer: NEGATIVE

## 2021-04-13 LAB — COMPREHENSIVE METABOLIC PANEL
ALT: 31 IU/L (ref 0–32)
AST: 44 IU/L — ABNORMAL HIGH (ref 0–40)
Albumin/Globulin Ratio: 1.6 (ref 1.2–2.2)
Albumin: 4.1 g/dL (ref 3.8–4.9)
Alkaline Phosphatase: 96 IU/L (ref 44–121)
BUN/Creatinine Ratio: 22 (ref 9–23)
BUN: 14 mg/dL (ref 6–24)
Bilirubin Total: <0.2 mg/dL (ref 0.0–1.2)
CO2: 23 mmol/L (ref 20–29)
Calcium: 9.5 mg/dL (ref 8.7–10.2)
Chloride: 102 mmol/L (ref 96–106)
Creatinine, Ser: 0.65 mg/dL (ref 0.57–1.00)
Globulin, Total: 2.6 g/dL (ref 1.5–4.5)
Glucose: 79 mg/dL (ref 70–99)
Potassium: 4.2 mmol/L (ref 3.5–5.2)
Sodium: 141 mmol/L (ref 134–144)
Total Protein: 6.7 g/dL (ref 6.0–8.5)
eGFR: 103 mL/min/{1.73_m2} (ref >59)

## 2021-04-13 LAB — RHEUMATOID ARTHRITIS PROFILE
Cyclic Citrullin Peptide Ab: <1 units (ref 0–19)
Rheumatoid fact SerPl-aCnc: <10 IU/mL (ref <14.0)

## 2021-04-13 LAB — VITAMIN D 25 HYDROXY (VIT D DEFICIENCY, FRACTURES): Vit D, 25-Hydroxy: 38.3 ng/mL (ref 30.0–100.0)

## 2021-04-13 LAB — ANA: Anti Nuclear Antibody (ANA): NEGATIVE

## 2021-04-13 LAB — BRAIN NATRIURETIC PEPTIDE: BNP: 16.1 pg/mL (ref 0.0–100.0)

## 2021-04-13 LAB — BABESIA MICROTI ANTIBODY PANEL
Babesia microti IgG: <1:10 {titer}
Babesia microti IgM: <1:10 {titer}

## 2021-04-13 LAB — LYME DISEASE SEROLOGY W/REFLEX: Lyme Total Antibody EIA: NEGATIVE

## 2021-04-13 LAB — HEPATITIS C ANTIBODY: Hep C Virus Ab: 0.2 s/co ratio (ref 0.0–0.9)

## 2021-04-13 LAB — TSH: TSH: 1.36 u[IU]/mL (ref 0.450–4.500)

## 2021-04-13 LAB — SEDIMENTATION RATE: Sed Rate: 57 mm/hr — ABNORMAL HIGH (ref 0–40)

## 2021-04-20 ENCOUNTER — Telehealth: Payer: Self-pay | Admitting: Family Medicine

## 2021-04-20 DIAGNOSIS — R609 Edema, unspecified: Secondary | ICD-10-CM

## 2021-04-20 DIAGNOSIS — M2550 Pain in unspecified joint: Secondary | ICD-10-CM

## 2021-04-20 NOTE — Telephone Encounter (Signed)
Copied from CRM [id number]. Topic: General - Other >> Apr 17, 2021 11:52 AM [name], Lindsey Drake wrote: Reason for CRM: Pt requested Drake call to go over latest lab results, pt stated she saw them on MyChart but does not understand what is going on, please advise.

## 2021-04-21 NOTE — Telephone Encounter (Signed)
Went over provider result with patient, patient states she is in a lot of pain and was very frustrated about not having a diagnosis. Patient states she can hardly walk and would like to be referred to a rheumatologist.

## 2021-04-23 NOTE — Telephone Encounter (Signed)
Patient states she is still having pain and would like an appointment for next week. Patient scheduled for next Thursday.

## 2021-04-23 NOTE — Telephone Encounter (Signed)
Please let her know that her referral has been placed, but if she is getting worse, we should see her again to consider x-rays etc.

## 2021-04-30 ENCOUNTER — Encounter: Payer: Self-pay | Admitting: Family Medicine

## 2021-04-30 ENCOUNTER — Ambulatory Visit
Admission: RE | Admit: 2021-04-30 | Discharge: 2021-04-30 | Disposition: A | Discharge status: Home / Self Care | Payer: 59 | Attending: Family Medicine | Admitting: Family Medicine

## 2021-04-30 ENCOUNTER — Ambulatory Visit (INDEPENDENT_AMBULATORY_CARE_PROVIDER_SITE_OTHER): Payer: 59 | Admitting: Family Medicine

## 2021-04-30 ENCOUNTER — Ambulatory Visit
Admission: RE | Admit: 2021-04-30 | Discharge: 2021-04-30 | Disposition: A | Discharge status: Home / Self Care | Payer: 59 | Source: Ambulatory Visit | Attending: Family Medicine | Admitting: Family Medicine

## 2021-04-30 ENCOUNTER — Other Ambulatory Visit: Payer: Self-pay

## 2021-04-30 VITALS — BP 108/75 | HR 91 | Temp 98.0°F | Wt 176.0 lb

## 2021-04-30 DIAGNOSIS — M47816 Spondylosis without myelopathy or radiculopathy, lumbar region: Secondary | ICD-10-CM

## 2021-04-30 MED ORDER — BACLOFEN 10 MG PO TABS: 10.0000 mg | ORAL_TABLET | Freq: Every evening | ORAL | 0 refills | Status: DC | PRN

## 2021-04-30 MED ORDER — KETOROLAC TROMETHAMINE 60 MG/2ML IM SOLN
60.0000 mg | Freq: Once | INTRAMUSCULAR | Status: AC
  Administered 2021-04-30: 60 mg via INTRAMUSCULAR

## 2021-04-30 MED ORDER — GABAPENTIN 400 MG PO CAPS: ORAL_CAPSULE | ORAL | 3 refills | Status: DC

## 2021-04-30 NOTE — Assessment & Plan Note (Signed)
In acute exacerbation. Has an appointment with PM&R on Tuesday. Will increase her gabapentin to 400mg  2-3x a day. Toradol shot today. Baclofen at bedtime. Will get x-ray. Likely will need new MRI. Will get her into PT as last place was not covered by her insurance. Continue to monitor. Call with any concerns.

## 2021-04-30 NOTE — Progress Notes (Signed)
BP 108/75   Pulse 91   Temp 98 F (36.7 C)   Wt 176 lb (79.8 kg)   SpO2 97%   BMI 32.19 kg/m    Subjective:    Patient ID: Lindsey Drake, female    DOB: 1966-04-29, 55 y.o.   MRN: [id number]  HPI: Lindsey Drake is a 55 y.o. female  Chief Complaint  Patient presents with   Leg Pain    Patient states she is experiencing a lot of leg pain, both sides from the hip down. Patient states she is not able to sleep at night due to pain.   BACK/LEG PAIN Duration: 3 weeks Mechanism of injury: unknown Location: bilateral low back and legs Onset: sudden Severity: severe Quality: spasming, severe, shooting Frequency: constant Radiation: R leg below the knee and L leg below the knee Aggravating factors: movement, sitting, anything Alleviating factors: nothing Status: worse Treatments attempted: gabapentin, tramadol, meloxicam, methocarbamol  Relief with NSAIDs?: mild Nighttime pain:  yes Paresthesias / decreased sensation:  no Bowel / bladder incontinence:  no Fevers:  no Dysuria / urinary frequency:  no   Relevant past medical, surgical, family and social history reviewed and updated as indicated. Interim medical history since our last visit reviewed. Allergies and medications reviewed and updated.  Review of Systems  Constitutional: Negative.   Respiratory: Negative.    Cardiovascular: Negative.   Gastrointestinal: Negative.   Musculoskeletal:  Positive for back pain and myalgias. Negative for arthralgias, gait problem, joint swelling, neck pain and neck stiffness.  Skin: Negative.   Neurological:  Positive for weakness and numbness. Negative for dizziness, tremors, seizures, syncope, facial asymmetry, speech difficulty, light-headedness and headaches.  Psychiatric/Behavioral: Negative.     Per HPI unless specifically indicated above     Objective:    BP 108/75   Pulse 91   Temp 98 F (36.7 C)   Wt 176 lb (79.8 kg)   SpO2 97%   BMI 32.19 kg/m   Wt Readings  from Last 3 Encounters:  04/30/21 176 lb (79.8 kg)  04/09/21 176 lb (79.8 kg)  08/12/20 185 lb 1.6 oz (84 kg)    Physical Exam Vitals and nursing note reviewed.  Constitutional:      General: She is in acute distress.     Appearance: Normal appearance. She is not ill-appearing, toxic-appearing or diaphoretic.  HENT:     Head: Normocephalic and atraumatic.     Right Ear: External ear normal.     Left Ear: External ear normal.     Nose: Nose normal.     Mouth/Throat:     Mouth: Mucous membranes are moist.     Pharynx: Oropharynx is clear.  Eyes:     General: No scleral icterus.       Right eye: No discharge.        Left eye: No discharge.     Extraocular Movements: Extraocular movements intact.     Conjunctiva/sclera: Conjunctivae normal.     Pupils: Pupils are equal, round, and reactive to light.  Cardiovascular:     Rate and Rhythm: Normal rate and regular rhythm.     Pulses: Normal pulses.     Heart sounds: Normal heart sounds. No murmur heard.   No friction rub. No gallop.  Pulmonary:     Effort: Pulmonary effort is normal. No respiratory distress.     Breath sounds: Normal breath sounds. No stridor. No wheezing, rhonchi or rales.  Chest:     Chest wall: No  tenderness.  Musculoskeletal:        General: Normal range of motion.     Cervical back: Normal range of motion and neck supple.  Skin:    General: Skin is warm and dry.     Capillary Refill: Capillary refill takes less than 2 seconds.     Coloration: Skin is not jaundiced or pale.     Findings: No bruising, erythema, lesion or rash.  Neurological:     General: No focal deficit present.     Mental Status: She is alert and oriented to person, place, and time. Mental status is at baseline.  Psychiatric:        Mood and Affect: Mood normal.        Behavior: Behavior normal.        Thought Content: Thought content normal.        Judgment: Judgment normal.    Results for orders placed or performed in visit on  04/09/21  Microscopic Examination   Urine  Result Value Ref Range   WBC, UA None seen 0 - 5 /hpf   RBC 0-2 0 - 2 /hpf   Epithelial Cells (non renal) 0-10 0 - 10 /hpf   Casts Present (A) None seen /lpf   Cast Type Hyaline casts N/A   Mucus, UA Present (A) Not Estab.   Bacteria, UA None seen None seen/Few  Comprehensive metabolic panel  Result Value Ref Range   Glucose 79 70 - 99 mg/dL   BUN 14 6 - 24 mg/dL   Creatinine, Ser 0.65 0.57 - 1.00 mg/dL   eGFR 103 >59 mL/min/1.73   BUN/Creatinine Ratio 22 9 - 23   Sodium 141 134 - 144 mmol/L   Potassium 4.2 3.5 - 5.2 mmol/L   Chloride 102 96 - 106 mmol/L   CO2 23 20 - 29 mmol/L   Calcium 9.5 8.7 - 10.2 mg/dL   Total Protein 6.7 6.0 - 8.5 g/dL   Albumin 4.1 3.8 - 4.9 g/dL   Globulin, Total 2.6 1.5 - 4.5 g/dL   Albumin/Globulin Ratio 1.6 1.2 - 2.2   Bilirubin Total <0.2 0.0 - 1.2 mg/dL   Alkaline Phosphatase 96 44 - 121 IU/L   AST 44 (H) 0 - 40 IU/L   ALT 31 0 - 32 IU/L  CBC with Differential/Platelet  Result Value Ref Range   WBC 10.4 3.4 - 10.8 x10E3/uL   RBC 4.45 3.77 - 5.28 x10E6/uL   Hemoglobin 12.4 11.1 - 15.9 g/dL   Hematocrit 37.5 34.0 - 46.6 %   MCV 84 79 - 97 fL   MCH 27.9 26.6 - 33.0 pg   MCHC 33.1 31.5 - 35.7 g/dL   RDW 12.7 11.7 - 15.4 %   Platelets 421 150 - 450 x10E3/uL   Neutrophils 64 Not Estab. %   Lymphs 28 Not Estab. %   Monocytes 6 Not Estab. %   Eos 2 Not Estab. %   Basos 0 Not Estab. %   Neutrophils Absolute 6.6 1.4 - 7.0 x10E3/uL   Lymphocytes Absolute 3.0 0.7 - 3.1 x10E3/uL   Monocytes Absolute 0.6 0.1 - 0.9 x10E3/uL   EOS (ABSOLUTE) 0.2 0.0 - 0.4 x10E3/uL   Basophils Absolute 0.0 0.0 - 0.2 x10E3/uL   Immature Granulocytes 0 Not Estab. %   Immature Grans (Abs) 0.0 0.0 - 0.1 x10E3/uL  Lipid Panel w/o Chol/HDL Ratio  Result Value Ref Range   Cholesterol, Total 215 (H) 100 - 199 mg/dL   Triglycerides 266 (H) 0 -  149 mg/dL   HDL 44 >39 mg/dL   VLDL Cholesterol Cal 47 (H) 5 - 40 mg/dL   LDL Chol  Calc (NIH) 124 (H) 0 - 99 mg/dL  Microalbumin, Urine Waived  Result Value Ref Range   Microalb, Ur Waived 80 (H) 0 - 19 mg/L   Creatinine, Urine Waived 300 10 - 300 mg/dL   Microalb/Creat Ratio 30-300 (H) <30 mg/g  TSH  Result Value Ref Range   TSH 1.360 0.450 - 4.500 uIU/mL  Urinalysis, Routine w reflex microscopic  Result Value Ref Range   Specific Gravity, UA >1.030 (H) 1.005 - 1.030   pH, UA 5.0 5.0 - 7.5   Color, UA Yellow Yellow   Appearance Ur Cloudy (A) Clear   Leukocytes,UA Negative Negative   Protein,UA 1+ (A) Negative/Trace   Glucose, UA Negative Negative   Ketones, UA Negative Negative   RBC, UA 1+ (A) Negative   Bilirubin, UA Negative Negative   Urobilinogen, Ur 0.2 0.2 - 1.0 mg/dL   Nitrite, UA Negative Negative   Microscopic Examination See below:   VITAMIN D 25 Hydroxy (Vit-D Deficiency, Fractures)  Result Value Ref Range   Vit D, 25-Hydroxy 38.3 30.0 - 100.0 ng/mL  B Nat Peptide  Result Value Ref Range   BNP 16.1 0.0 - 100.0 pg/mL  Hepatitis C Antibody  Result Value Ref Range   Hep C Virus Ab 0.2 0.0 - 0.9 s/co ratio  Sed Rate (ESR)  Result Value Ref Range   Sed Rate 57 (H) 0 - 40 mm/hr  Antinuclear Antib (ANA)  Result Value Ref Range   Anti Nuclear Antibody (ANA) Negative Negative  Babesia microti Antibody Panel  Result Value Ref Range   Babesia microti IgM <1:10 Neg:<1:10   Babesia microti IgG <2:02 BXI:<3:56  Ehrlichia Antibody Panel  Result Value Ref Range   E.Chaffeensis (HME) IgG Negative Neg:<1:64   E. Chaffeensis (HME) IgM Titer Negative Neg:<1:20   HGE IgG Titer Negative Neg:<1:64   HGE IgM Titer Negative Neg:<1:20  Rocky mtn spotted fvr abs pnl(IgG+IgM)  Result Value Ref Range   RMSF IgG Negative Negative   RMSF IgM 0.53 0.00 - 0.89 index  Rheumatoid Arthritis Profile  Result Value Ref Range   Rhuematoid fact SerPl-aCnc <10.0 <86.1 IU/mL   Cyclic Citrullin Peptide Ab <1 0 - 19 units  Lyme Disease Serology w/Reflex  Result Value Ref  Range   Lyme Total Antibody EIA Negative Negative      Assessment & Plan:   Problem List Items Addressed This Visit       Other   Lumbar facet syndrome (Bilateral) (R>L) - Primary (Chronic)    In acute exacerbation. Has an appointment with PM&R on Tuesday. Will increase her gabapentin to 455m 2-3x a day. Toradol shot today. Baclofen at bedtime. Will get x-ray. Likely will need new MRI. Will get her into PT as last place was not covered by her insurance. Continue to monitor. Call with any concerns.       Relevant Medications   traMADol (ULTRAM) 50 MG tablet   baclofen (LIORESAL) 10 MG tablet   Other Relevant Orders   DG Lumbar Spine Complete   Ambulatory referral to Physical Therapy     Follow up plan: Return in about 2 weeks (around 05/14/2021).

## 2021-05-01 ENCOUNTER — Other Ambulatory Visit: Payer: Self-pay | Admitting: Family Medicine

## 2021-05-01 DIAGNOSIS — M4726 Other spondylosis with radiculopathy, lumbar region: Secondary | ICD-10-CM

## 2021-05-01 DIAGNOSIS — M47816 Spondylosis without myelopathy or radiculopathy, lumbar region: Secondary | ICD-10-CM

## 2021-05-06 ENCOUNTER — Ambulatory Visit: Admission: RE | Admit: 2021-05-06 | Payer: 59 | Source: Ambulatory Visit

## 2021-05-06 ENCOUNTER — Telehealth: Payer: Self-pay

## 2021-05-06 ENCOUNTER — Telehealth: Payer: Self-pay | Admitting: Family Medicine

## 2021-05-06 DIAGNOSIS — M47816 Spondylosis without myelopathy or radiculopathy, lumbar region: Secondary | ICD-10-CM

## 2021-05-06 DIAGNOSIS — M4726 Other spondylosis with radiculopathy, lumbar region: Secondary | ICD-10-CM

## 2021-05-06 NOTE — Telephone Encounter (Signed)
Called and notified, they are trying to get in contact with patient about MRI scheduled today at 1:30

## 2021-05-06 NOTE — Telephone Encounter (Signed)
Verlon Au from [facility] called and asked if the MRI order of the lumbar could be changed to STAT so the patient could be scheduled sooner.

## 2021-05-06 NOTE — Telephone Encounter (Signed)
Can we change this? I cannot edit orders once they're in.

## 2021-05-06 NOTE — Telephone Encounter (Signed)
[place] called back regarding pt's referral to [facility] for MRI.  States that pt is in a lot of pain and asked if order can be placed STAT.  See previous encounter.  She requested to be contacted at [phone] once new orders are put in.  You can speak with [place] or [place].

## 2021-05-12 ENCOUNTER — Other Ambulatory Visit: Payer: Self-pay

## 2021-05-12 ENCOUNTER — Ambulatory Visit
Admission: RE | Admit: 2021-05-12 | Discharge: 2021-05-12 | Disposition: A | Discharge status: Home / Self Care | Payer: 59 | Source: Ambulatory Visit | Attending: Family Medicine | Admitting: Family Medicine

## 2021-05-12 DIAGNOSIS — M4726 Other spondylosis with radiculopathy, lumbar region: Secondary | ICD-10-CM | POA: Insufficient documentation

## 2021-05-12 DIAGNOSIS — M47816 Spondylosis without myelopathy or radiculopathy, lumbar region: Secondary | ICD-10-CM | POA: Insufficient documentation

## 2021-05-14 ENCOUNTER — Ambulatory Visit: Payer: 59 | Admitting: Family Medicine

## 2021-05-15 ENCOUNTER — Ambulatory Visit: Payer: 59

## 2021-05-28 ENCOUNTER — Encounter: Payer: Self-pay | Admitting: Family Medicine

## 2021-05-28 ENCOUNTER — Other Ambulatory Visit: Payer: Self-pay

## 2021-05-28 ENCOUNTER — Ambulatory Visit (INDEPENDENT_AMBULATORY_CARE_PROVIDER_SITE_OTHER): Payer: 59 | Admitting: Family Medicine

## 2021-05-28 VITALS — BP 113/82 | HR 99 | Temp 98.4°F | Ht 62.0 in | Wt 168.2 lb

## 2021-05-28 DIAGNOSIS — F411 Generalized anxiety disorder: Secondary | ICD-10-CM | POA: Diagnosis not present

## 2021-05-28 DIAGNOSIS — E782 Mixed hyperlipidemia: Secondary | ICD-10-CM

## 2021-05-28 DIAGNOSIS — S0502XA Injury of conjunctiva and corneal abrasion without foreign body, left eye, initial encounter: Secondary | ICD-10-CM

## 2021-05-28 DIAGNOSIS — I10 Essential (primary) hypertension: Secondary | ICD-10-CM | POA: Diagnosis not present

## 2021-05-28 DIAGNOSIS — Z0000 Encounter for general adult medical examination without abnormal findings: Secondary | ICD-10-CM | POA: Diagnosis not present

## 2021-05-28 DIAGNOSIS — Z1231 Encounter for screening mammogram for malignant neoplasm of breast: Secondary | ICD-10-CM

## 2021-05-28 DIAGNOSIS — M47816 Spondylosis without myelopathy or radiculopathy, lumbar region: Secondary | ICD-10-CM

## 2021-05-28 LAB — MICROALBUMIN, URINE WAIVED
Creatinine, Urine Waived: 50 mg/dL (ref 10–300)
Microalb, Ur Waived: 10 mg/L (ref 0–19)
Microalb/Creat Ratio: <30 mg/g (ref <30)

## 2021-05-28 LAB — MICROSCOPIC EXAMINATION: WBC, UA: NONE SEEN /hpf (ref 0–5)

## 2021-05-28 LAB — URINALYSIS, ROUTINE W REFLEX MICROSCOPIC
Bilirubin, UA: NEGATIVE
Glucose, UA: NEGATIVE
Ketones, UA: NEGATIVE
Leukocytes,UA: NEGATIVE
Nitrite, UA: NEGATIVE
Protein,UA: NEGATIVE
Specific Gravity, UA: 1.01 (ref 1.005–1.030)
Urobilinogen, Ur: 0.2 mg/dL (ref 0.2–1.0)
pH, UA: 5.5 (ref 5.0–7.5)

## 2021-05-28 MED ORDER — ESCITALOPRAM OXALATE 10 MG PO TABS
ORAL_TABLET | ORAL | 2 refills | Status: DC
Start: 2021-05-28 — End: 2021-08-18

## 2021-05-28 MED ORDER — ERYTHROMYCIN 5 MG/GM OP OINT: 1.0000 "application " | TOPICAL_OINTMENT | Freq: Every day | OPHTHALMIC | 0 refills | Status: DC

## 2021-05-28 NOTE — Assessment & Plan Note (Signed)
Did not start her lexapro. Will restart. Call with any concerns. Continue to monitor.

## 2021-05-28 NOTE — Assessment & Plan Note (Signed)
Doing well without medicine. Continue to monitor. Call with any concerns.

## 2021-05-28 NOTE — Assessment & Plan Note (Signed)
To see neurosurgeon on 06/11/21. Call with any concerns. Continue to monitor.

## 2021-05-28 NOTE — Patient Instructions (Signed)
Please call to schedule your mammogram: °[facility]  °Address: [address], [place], [place] [address]  °Phone: [phone] ° °
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
## 2021-05-28 NOTE — Assessment & Plan Note (Signed)
Rechecking labs today. Await results. Treat as needed.  °

## 2021-05-29 LAB — LIPID PANEL W/O CHOL/HDL RATIO
Cholesterol, Total: 203 mg/dL — ABNORMAL HIGH (ref 100–199)
HDL: 59 mg/dL (ref >39)
LDL Chol Calc (NIH): 112 mg/dL — ABNORMAL HIGH (ref 0–99)
Triglycerides: 185 mg/dL — ABNORMAL HIGH (ref 0–149)
VLDL Cholesterol Cal: 32 mg/dL (ref 5–40)

## 2021-05-29 LAB — CBC WITH DIFFERENTIAL/PLATELET
Basophils Absolute: 0 10*3/uL (ref 0.0–0.2)
Basos: 0 %
EOS (ABSOLUTE): 0.1 10*3/uL (ref 0.0–0.4)
Eos: 1 %
Hematocrit: 34.6 % (ref 34.0–46.6)
Hemoglobin: 11.5 g/dL (ref 11.1–15.9)
Immature Grans (Abs): 0 10*3/uL (ref 0.0–0.1)
Immature Granulocytes: 0 %
Lymphocytes Absolute: 3.1 10*3/uL (ref 0.7–3.1)
Lymphs: 22 %
MCH: 26.7 pg (ref 26.6–33.0)
MCHC: 33.2 g/dL (ref 31.5–35.7)
MCV: 80 fL (ref 79–97)
Monocytes Absolute: 0.7 10*3/uL (ref 0.1–0.9)
Monocytes: 5 %
Neutrophils Absolute: 10.1 10*3/uL — ABNORMAL HIGH (ref 1.4–7.0)
Neutrophils: 72 %
Platelets: 430 10*3/uL (ref 150–450)
RBC: 4.31 x10E6/uL (ref 3.77–5.28)
RDW: 12.6 % (ref 11.7–15.4)
WBC: 14.1 10*3/uL — ABNORMAL HIGH (ref 3.4–10.8)

## 2021-05-29 LAB — COMPREHENSIVE METABOLIC PANEL
ALT: 9 IU/L (ref 0–32)
AST: 17 IU/L (ref 0–40)
Albumin/Globulin Ratio: 1.6 (ref 1.2–2.2)
Albumin: 4.1 g/dL (ref 3.8–4.9)
Alkaline Phosphatase: 106 IU/L (ref 44–121)
BUN/Creatinine Ratio: 19 (ref 9–23)
BUN: 13 mg/dL (ref 6–24)
Bilirubin Total: <0.2 mg/dL (ref 0.0–1.2)
CO2: 25 mmol/L (ref 20–29)
Calcium: 9.6 mg/dL (ref 8.7–10.2)
Chloride: 101 mmol/L (ref 96–106)
Creatinine, Ser: 0.68 mg/dL (ref 0.57–1.00)
Globulin, Total: 2.6 g/dL (ref 1.5–4.5)
Glucose: 90 mg/dL (ref 70–99)
Potassium: 4.3 mmol/L (ref 3.5–5.2)
Sodium: 139 mmol/L (ref 134–144)
Total Protein: 6.7 g/dL (ref 6.0–8.5)
eGFR: 102 mL/min/{1.73_m2} (ref >59)

## 2021-05-29 LAB — TSH: TSH: 0.829 u[IU]/mL (ref 0.450–4.500)

## 2021-06-18 ENCOUNTER — Other Ambulatory Visit: Payer: Self-pay | Admitting: Neurosurgery

## 2021-06-18 DIAGNOSIS — Z01818 Encounter for other preprocedural examination: Secondary | ICD-10-CM

## 2021-07-06 ENCOUNTER — Other Ambulatory Visit
Admission: RE | Admit: 2021-07-06 | Discharge: 2021-07-06 | Disposition: A | Discharge status: Home / Self Care | Payer: 59 | Source: Ambulatory Visit | Attending: Neurosurgery | Admitting: Neurosurgery

## 2021-07-06 ENCOUNTER — Other Ambulatory Visit: Payer: Self-pay

## 2021-07-06 DIAGNOSIS — Z01818 Encounter for other preprocedural examination: Secondary | ICD-10-CM | POA: Diagnosis not present

## 2021-07-06 LAB — TYPE AND SCREEN
ABO/RH(D): A POS
Antibody Screen: NEGATIVE
Extend sample reason: TRANSFUSED

## 2021-07-06 LAB — SURGICAL PCR SCREEN
MRSA, PCR: NEGATIVE
Staphylococcus aureus: NEGATIVE

## 2021-07-06 LAB — APTT: aPTT: 33 seconds (ref 24–36)

## 2021-07-06 LAB — CBC
HCT: 36.8 % (ref 36.0–46.0)
Hemoglobin: 11.5 g/dL — ABNORMAL LOW (ref 12.0–15.0)
MCH: 25.6 pg — ABNORMAL LOW (ref 26.0–34.0)
MCHC: 31.3 g/dL (ref 30.0–36.0)
MCV: 81.8 fL (ref 80.0–100.0)
Platelets: 424 10*3/uL — ABNORMAL HIGH (ref 150–400)
RBC: 4.5 MIL/uL (ref 3.87–5.11)
RDW: 15.4 % (ref 11.5–15.5)
WBC: 11.5 10*3/uL — ABNORMAL HIGH (ref 4.0–10.5)
nRBC: 0 % (ref 0.0–0.2)

## 2021-07-06 LAB — PROTIME-INR
INR: 1 (ref 0.8–1.2)
Prothrombin Time: 13.2 seconds (ref 11.4–15.2)

## 2021-07-06 NOTE — Patient Instructions (Signed)
Your procedure is scheduled on: 07/15/21 - Wednesday Report to the Registration Desk on the 1st floor of the [facility]. To find out your arrival time, please call [phone] between 1PM - 3PM on: 07/14/21 - Tuesday  REMEMBER: Instructions that are not followed completely may result in serious medical risk, up to and including death; or upon the discretion of your surgeon and anesthesiologist your surgery may need to be rescheduled.  Do not eat food after midnight the night before surgery.  No gum chewing, lozengers or hard candies.  You may however, drink CLEAR liquids up to 2 hours before you are scheduled to arrive for your surgery. Do not drink anything within 2 hours of your scheduled arrival time.  Clear liquids include: - water  - apple juice without pulp - gatorade (not RED, PURPLE, OR BLUE) - black coffee or tea (Do NOT add milk or creamers to the coffee or tea) Do NOT drink anything that is not on this list.  TAKE THESE MEDICATIONS THE MORNING OF SURGERY WITH A SIP OF WATER:  - methocarbamol (ROBAXIN) 500 MG tablet - HYDROcodone-acetaminophen (NORCO/VICODIN) 5-325 MG tablet  One week prior to surgery: meloxicam (MOBIC) 15 MG tablet Stop Anti-inflammatories (NSAIDS) such as Advil, Aleve, Ibuprofen, Motrin, Naproxen, Naprosyn and Aspirin based products such as Excedrin, Goodys Powder, BC Powder.  Stop ANY OVER THE COUNTER supplements until after surgery.  You may however, continue to take Tylenol if needed for pain up until the day of surgery.  No Alcohol for 24 hours before or after surgery.  No Smoking including e-cigarettes for 24 hours prior to surgery.  No chewable tobacco products for at least 6 hours prior to surgery.  No nicotine patches on the day of surgery.  Do not use any "recreational" drugs for at least a week prior to your surgery.  Please be advised that the combination of cocaine and anesthesia may have negative outcomes, up to and including  death. If you test positive for cocaine, your surgery will be cancelled.  On the morning of surgery brush your teeth with toothpaste and water, you may rinse your mouth with mouthwash if you wish. Do not swallow any toothpaste or mouthwash.  Use CHG Soap or wipes as directed on instruction sheet.  Do not wear jewelry, make-up, hairpins, clips or nail polish.  Do not wear lotions, powders, or perfumes.   Do not shave body from the neck down 48 hours prior to surgery just in case you cut yourself which could leave a site for infection.  Also, freshly shaved skin may become irritated if using the CHG soap.  Contact lenses, hearing aids and dentures may not be worn into surgery.  Do not bring valuables to the hospital. [facility] is not responsible for any missing/lost belongings or valuables.   Notify your doctor if there is any change in your medical condition (cold, fever, infection).  Wear comfortable clothing (specific to your surgery type) to the hospital.  After surgery, you can help prevent lung complications by doing breathing exercises.  Take deep breaths and cough every 1-2 hours. Your doctor may order a device called an Incentive Spirometer to help you take deep breaths. When coughing or sneezing, hold a pillow firmly against your incision with both hands. This is called splinting. Doing this helps protect your incision. It also decreases belly discomfort.  If you are being admitted to the hospital overnight, leave your suitcase in the car. After surgery it may be brought to  your room.  If you are being discharged the day of surgery, you will not be allowed to drive home. You will need a responsible adult (18 years or older) to drive you home and stay with you that night.   If you are taking public transportation, you will need to have a responsible adult (18 years or older) with you. Please confirm with your physician that it is acceptable to use public transportation.    Please call the [facility] at [phone] if you have any questions about these instructions.  Surgery Visitation Policy:  Patients undergoing a surgery or procedure may have one family member or support person with them as long as that person is not COVID-19 positive or experiencing its symptoms.  That person may remain in the waiting area during the procedure and may rotate out with other people.  Inpatient Visitation:    Visiting hours are 7 a.m. to 8 p.m. Up to two visitors ages 16+ are allowed at one time in a patient room. The visitors may rotate out with other people during the day. Visitors must check out when they leave, or other visitors will not be allowed. One designated support person may remain overnight. The visitor must pass COVID-19 screenings, use hand sanitizer when entering and exiting the patients room and wear a mask at all times, including in the patients room. Patients must also wear a mask when staff or their visitor are in the room. Masking is required regardless of vaccination status.

## 2021-07-15 ENCOUNTER — Other Ambulatory Visit: Payer: Self-pay

## 2021-07-15 ENCOUNTER — Ambulatory Visit: Payer: 59 | Admitting: Urgent Care

## 2021-07-15 ENCOUNTER — Ambulatory Visit
Admission: RE | Admit: 2021-07-15 | Discharge: 2021-07-15 | Disposition: A | Discharge status: Home / Self Care | Payer: 59 | Attending: Neurosurgery | Admitting: Neurosurgery

## 2021-07-15 ENCOUNTER — Ambulatory Visit: Payer: 59

## 2021-07-15 ENCOUNTER — Encounter: Payer: Self-pay | Admitting: Neurosurgery

## 2021-07-15 ENCOUNTER — Encounter
Admission: RE | Disposition: A | Discharge status: Home / Self Care | Payer: Self-pay | Source: Home / Self Care | Attending: Neurosurgery

## 2021-07-15 DIAGNOSIS — Z79899 Other long term (current) drug therapy: Secondary | ICD-10-CM | POA: Insufficient documentation

## 2021-07-15 DIAGNOSIS — M5116 Intervertebral disc disorders with radiculopathy, lumbar region: Secondary | ICD-10-CM | POA: Insufficient documentation

## 2021-07-15 DIAGNOSIS — Z419 Encounter for procedure for purposes other than remedying health state, unspecified: Secondary | ICD-10-CM

## 2021-07-15 HISTORY — PX: LUMBAR LAMINECTOMY/DECOMPRESSION MICRODISCECTOMY: SHX5026

## 2021-07-15 LAB — TYPE AND SCREEN
ABO/RH(D): A POS
Antibody Screen: NEGATIVE

## 2021-07-15 SURGERY — LUMBAR LAMINECTOMY/DECOMPRESSION MICRODISCECTOMY 1 LEVEL
Anesthesia: General | Site: Back | Laterality: Left

## 2021-07-15 MED ORDER — CEFAZOLIN SODIUM-DEXTROSE 2-4 GM/100ML-% IV SOLN
INTRAVENOUS | Status: AC
  Filled 2021-07-15: qty 100

## 2021-07-15 MED ORDER — FENTANYL CITRATE (PF) 100 MCG/2ML IJ SOLN
INTRAMUSCULAR | Status: DC | PRN
  Administered 2021-07-15: 50 ug via INTRAVENOUS

## 2021-07-15 MED ORDER — SURGIFLO WITH THROMBIN (HEMOSTATIC MATRIX KIT) OPTIME
TOPICAL | Status: DC | PRN
Start: 2021-07-15 — End: 2021-07-15
  Administered 2021-07-15: 1 via TOPICAL

## 2021-07-15 MED ORDER — DEXAMETHASONE SODIUM PHOSPHATE 10 MG/ML IJ SOLN
INTRAMUSCULAR | Status: DC | PRN
  Administered 2021-07-15: 10 mg via INTRAVENOUS

## 2021-07-15 MED ORDER — KETAMINE HCL 50 MG/5ML IJ SOSY
PREFILLED_SYRINGE | INTRAMUSCULAR | Status: AC
  Filled 2021-07-15: qty 5

## 2021-07-15 MED ORDER — ONDANSETRON HCL 4 MG/2ML IJ SOLN
INTRAMUSCULAR | Status: DC | PRN
  Administered 2021-07-15: 4 mg via INTRAVENOUS

## 2021-07-15 MED ORDER — MIDAZOLAM HCL 2 MG/2ML IJ SOLN
INTRAMUSCULAR | Status: AC
  Filled 2021-07-15: qty 2

## 2021-07-15 MED ORDER — FAMOTIDINE 20 MG PO TABS: 20.0000 mg | ORAL_TABLET | Freq: Once | ORAL | Status: AC

## 2021-07-15 MED ORDER — KETOROLAC TROMETHAMINE 30 MG/ML IJ SOLN
INTRAMUSCULAR | Status: AC
  Filled 2021-07-15: qty 1

## 2021-07-15 MED ORDER — OXYCODONE HCL 5 MG PO TABS
ORAL_TABLET | ORAL | Status: DC
Start: 2021-07-15 — End: 2021-07-15
  Filled 2021-07-15: qty 1

## 2021-07-15 MED ORDER — OXYCODONE HCL 5 MG PO TABS: 5.0000 mg | ORAL_TABLET | ORAL | 0 refills | Status: AC | PRN

## 2021-07-15 MED ORDER — APREPITANT 40 MG PO CAPS
ORAL_CAPSULE | ORAL | Status: AC
  Filled 2021-07-15: qty 1

## 2021-07-15 MED ORDER — LIDOCAINE HCL (PF) 2 % IJ SOLN
INTRAMUSCULAR | Status: AC
  Filled 2021-07-15: qty 5

## 2021-07-15 MED ORDER — METHOCARBAMOL 500 MG PO TABS
500.0000 mg | ORAL_TABLET | Freq: Once | ORAL | Status: AC
  Administered 2021-07-15: 500 mg via ORAL

## 2021-07-15 MED ORDER — SODIUM CHLORIDE (PF) 0.9 % IJ SOLN
INTRAMUSCULAR | Status: DC | PRN
  Administered 2021-07-15: 60 mL

## 2021-07-15 MED ORDER — SUCCINYLCHOLINE CHLORIDE 200 MG/10ML IV SOSY
PREFILLED_SYRINGE | INTRAVENOUS | Status: AC
  Filled 2021-07-15: qty 10

## 2021-07-15 MED ORDER — BUPIVACAINE LIPOSOME 1.3 % IJ SUSP
INTRAMUSCULAR | Status: AC
  Filled 2021-07-15: qty 20

## 2021-07-15 MED ORDER — OXYCODONE HCL 5 MG PO TABS
5.0000 mg | ORAL_TABLET | Freq: Once | ORAL | Status: AC
  Administered 2021-07-15: 5 mg via ORAL

## 2021-07-15 MED ORDER — BUPIVACAINE-EPINEPHRINE (PF) 0.5% -1:200000 IJ SOLN
INTRAMUSCULAR | Status: AC
  Filled 2021-07-15: qty 30

## 2021-07-15 MED ORDER — APREPITANT 40 MG PO CAPS
40.0000 mg | ORAL_CAPSULE | Freq: Once | ORAL | Status: AC
  Administered 2021-07-15: 40 mg via ORAL

## 2021-07-15 MED ORDER — ORAL CARE MOUTH RINSE: 15.0000 mL | Freq: Once | OROMUCOSAL | Status: AC

## 2021-07-15 MED ORDER — PROPOFOL 10 MG/ML IV BOLUS
INTRAVENOUS | Status: DC | PRN
  Administered 2021-07-15: 150 mg via INTRAVENOUS

## 2021-07-15 MED ORDER — FENTANYL CITRATE (PF) 100 MCG/2ML IJ SOLN
INTRAMUSCULAR | Status: AC
  Administered 2021-07-15: 25 ug via INTRAVENOUS
  Filled 2021-07-15: qty 2

## 2021-07-15 MED ORDER — FAMOTIDINE 20 MG PO TABS
ORAL_TABLET | ORAL | Status: AC
  Administered 2021-07-15: 20 mg via ORAL
  Filled 2021-07-15: qty 1

## 2021-07-15 MED ORDER — ONDANSETRON HCL 4 MG/2ML IJ SOLN
INTRAMUSCULAR | Status: AC
  Filled 2021-07-15: qty 2

## 2021-07-15 MED ORDER — FENTANYL CITRATE (PF) 100 MCG/2ML IJ SOLN
INTRAMUSCULAR | Status: AC
  Filled 2021-07-15: qty 2

## 2021-07-15 MED ORDER — MIDAZOLAM HCL 2 MG/2ML IJ SOLN
INTRAMUSCULAR | Status: DC | PRN
Start: 2021-07-15 — End: 2021-07-15
  Administered 2021-07-15: 2 mg via INTRAVENOUS

## 2021-07-15 MED ORDER — BUPIVACAINE-EPINEPHRINE (PF) 0.5% -1:200000 IJ SOLN
INTRAMUSCULAR | Status: DC | PRN
  Administered 2021-07-15: 7 mL

## 2021-07-15 MED ORDER — METHYLPREDNISOLONE ACETATE 40 MG/ML IJ SUSP
INTRAMUSCULAR | Status: AC
  Filled 2021-07-15: qty 1

## 2021-07-15 MED ORDER — ONDANSETRON HCL 4 MG/2ML IJ SOLN: 4.0000 mg | Freq: Once | INTRAMUSCULAR | Status: DC | PRN

## 2021-07-15 MED ORDER — DEXAMETHASONE SODIUM PHOSPHATE 10 MG/ML IJ SOLN
INTRAMUSCULAR | Status: AC
  Filled 2021-07-15: qty 1

## 2021-07-15 MED ORDER — CHLORHEXIDINE GLUCONATE 0.12 % MT SOLN: 15.0000 mL | Freq: Once | OROMUCOSAL | Status: AC

## 2021-07-15 MED ORDER — BUPIVACAINE HCL (PF) 0.5 % IJ SOLN
INTRAMUSCULAR | Status: AC
  Filled 2021-07-15: qty 30

## 2021-07-15 MED ORDER — FENTANYL CITRATE (PF) 100 MCG/2ML IJ SOLN
25.0000 ug | INTRAMUSCULAR | Status: DC | PRN
  Administered 2021-07-15 (×3): 25 ug via INTRAVENOUS

## 2021-07-15 MED ORDER — CEFAZOLIN SODIUM-DEXTROSE 2-4 GM/100ML-% IV SOLN
2.0000 g | Freq: Once | INTRAVENOUS | Status: AC
  Administered 2021-07-15: 2 g via INTRAVENOUS

## 2021-07-15 MED ORDER — LACTATED RINGERS IV SOLN: INTRAVENOUS | Status: DC

## 2021-07-15 MED ORDER — REMIFENTANIL HCL 1 MG IV SOLR
INTRAVENOUS | Status: DC | PRN
Start: 2021-07-15 — End: 2021-07-15
  Administered 2021-07-15: .15 ug/kg/min via INTRAVENOUS

## 2021-07-15 MED ORDER — SUCCINYLCHOLINE CHLORIDE 200 MG/10ML IV SOSY
PREFILLED_SYRINGE | INTRAVENOUS | Status: DC | PRN
Start: 2021-07-15 — End: 2021-07-15
  Administered 2021-07-15: 100 mg via INTRAVENOUS

## 2021-07-15 MED ORDER — METHOCARBAMOL 500 MG PO TABS: 500.0000 mg | ORAL_TABLET | Freq: Four times a day (QID) | ORAL | 0 refills | Status: DC

## 2021-07-15 MED ORDER — SENNA 8.6 MG PO TABS: 1.0000 | ORAL_TABLET | Freq: Every day | ORAL | 0 refills | Status: DC | PRN

## 2021-07-15 MED ORDER — KETAMINE HCL 10 MG/ML IJ SOLN
INTRAMUSCULAR | Status: DC | PRN
  Administered 2021-07-15: 30 mg via INTRAVENOUS

## 2021-07-15 MED ORDER — EPHEDRINE 5 MG/ML INJ
INTRAVENOUS | Status: AC
  Filled 2021-07-15: qty 5

## 2021-07-15 MED ORDER — EPHEDRINE SULFATE (PRESSORS) 50 MG/ML IJ SOLN
INTRAMUSCULAR | Status: DC | PRN
Start: 2021-07-15 — End: 2021-07-15
  Administered 2021-07-15 (×2): 10 mg via INTRAVENOUS

## 2021-07-15 MED ORDER — PROPOFOL 500 MG/50ML IV EMUL
INTRAVENOUS | Status: DC | PRN
  Administered 2021-07-15: 125 ug/kg/min via INTRAVENOUS

## 2021-07-15 MED ORDER — LIDOCAINE HCL (CARDIAC) PF 100 MG/5ML IV SOSY
PREFILLED_SYRINGE | INTRAVENOUS | Status: DC | PRN
Start: 2021-07-15 — End: 2021-07-15
  Administered 2021-07-15: 50 mg via INTRAVENOUS

## 2021-07-15 MED ORDER — 0.9 % SODIUM CHLORIDE (POUR BTL) OPTIME
TOPICAL | Status: DC | PRN
  Administered 2021-07-15: 1000 mL

## 2021-07-15 MED ORDER — REMIFENTANIL HCL 1 MG IV SOLR
INTRAVENOUS | Status: AC
  Filled 2021-07-15: qty 1000

## 2021-07-15 MED ORDER — METHYLPREDNISOLONE ACETATE 40 MG/ML IJ SUSP
INTRAMUSCULAR | Status: DC | PRN
Start: 2021-07-15 — End: 2021-07-15
  Administered 2021-07-15: 40 mg

## 2021-07-15 MED ORDER — KETOROLAC TROMETHAMINE 30 MG/ML IJ SOLN
INTRAMUSCULAR | Status: DC | PRN
  Administered 2021-07-15: 30 mg via INTRAVENOUS

## 2021-07-15 MED ORDER — PROPOFOL 1000 MG/100ML IV EMUL
INTRAVENOUS | Status: AC
  Filled 2021-07-15: qty 100

## 2021-07-15 MED ORDER — METHOCARBAMOL 500 MG PO TABS
ORAL_TABLET | ORAL | Status: AC
  Filled 2021-07-15: qty 1

## 2021-07-15 MED ORDER — CHLORHEXIDINE GLUCONATE 0.12 % MT SOLN
OROMUCOSAL | Status: AC
  Administered 2021-07-15: 15 mL via OROMUCOSAL
  Filled 2021-07-15: qty 15

## 2021-07-15 SURGICAL SUPPLY — 55 items
BUR NEURO DRILL SOFT 3.0X3.8M (BURR) ×2 IMPLANT
CHLORAPREP W/TINT 26 (MISCELLANEOUS) ×4 IMPLANT
CNTNR SPEC 2.5X3XGRAD LEK (MISCELLANEOUS) ×1
CONT SPEC 4OZ STER OR WHT (MISCELLANEOUS) ×1
CONT SPEC 4OZ STRL OR WHT (MISCELLANEOUS) ×1
CONTAINER SPEC 2.5X3XGRAD LEK (MISCELLANEOUS) ×1 IMPLANT
COUNTER NEEDLE 20/40 LG (NEEDLE) ×2 IMPLANT
CUP MEDICINE 2OZ PLAST GRAD ST (MISCELLANEOUS) ×4 IMPLANT
DERMABOND ADVANCED (GAUZE/BANDAGES/DRESSINGS) ×1
DERMABOND ADVANCED .7 DNX12 (GAUZE/BANDAGES/DRESSINGS) ×1 IMPLANT
DRAPE C ARM PK CFD 31 SPINE (DRAPES) ×2 IMPLANT
DRAPE LAPAROTOMY 100X77 ABD (DRAPES) ×2 IMPLANT
DRAPE MICROSCOPE SPINE 48X150 (DRAPES) ×2 IMPLANT
DRAPE SURG 17X11 SM STRL (DRAPES) ×2 IMPLANT
DRSG OPSITE POSTOP 3X4 (GAUZE/BANDAGES/DRESSINGS) ×1 IMPLANT
ELECT CAUTERY BLADE TIP 2.5 (TIP) ×2
ELECT EZSTD 165MM 6.5IN (MISCELLANEOUS)
ELECT REM PT RETURN 9FT ADLT (ELECTROSURGICAL) ×2
ELECTRODE CAUTERY BLDE TIP 2.5 (TIP) ×1 IMPLANT
ELECTRODE EZSTD 165MM 6.5IN (MISCELLANEOUS) IMPLANT
ELECTRODE REM PT RTRN 9FT ADLT (ELECTROSURGICAL) ×1 IMPLANT
GAUZE 4X4 16PLY ~~LOC~~+RFID DBL (SPONGE) ×2 IMPLANT
GLOVE SURG SYN 6.5 ES PF (GLOVE) ×4 IMPLANT
GLOVE SURG SYN 6.5 PF PI (GLOVE) ×2 IMPLANT
GLOVE SURG SYN 8.5  E (GLOVE) ×6
GLOVE SURG SYN 8.5 E (GLOVE) ×3 IMPLANT
GLOVE SURG SYN 8.5 PF PI (GLOVE) ×3 IMPLANT
GLOVE SURG UNDER POLY LF SZ6.5 (GLOVE) ×2 IMPLANT
GOWN SRG LRG LVL 4 IMPRV REINF (GOWNS) ×1 IMPLANT
GOWN SRG XL LVL 3 NONREINFORCE (GOWNS) ×1 IMPLANT
GOWN STRL NON-REIN TWL XL LVL3 (GOWNS) ×2
GOWN STRL REIN LRG LVL4 (GOWNS) ×2
GRADUATE 1200CC STRL 31836 (MISCELLANEOUS) ×2 IMPLANT
GRAFT DURAGEN MATRIX 1WX1L (Tissue) IMPLANT
KIT SPINAL PRONEVIEW (KITS) ×2 IMPLANT
MANIFOLD NEPTUNE II (INSTRUMENTS) ×2 IMPLANT
MARKER SKIN DUAL TIP RULER LAB (MISCELLANEOUS) ×4 IMPLANT
NDL SAFETY ECLIPSE 18X1.5 (NEEDLE) ×1 IMPLANT
NEEDLE HYPO 18GX1.5 SHARP (NEEDLE) ×2
NEEDLE HYPO 22GX1.5 SAFETY (NEEDLE) ×2 IMPLANT
NS IRRIG 1000ML POUR BTL (IV SOLUTION) ×2 IMPLANT
PACK LAMINECTOMY NEURO (CUSTOM PROCEDURE TRAY) ×2 IMPLANT
PAD ARMBOARD 7.5X6 YLW CONV (MISCELLANEOUS) ×2 IMPLANT
SURGIFLO W/THROMBIN 8M KIT (HEMOSTASIS) ×2 IMPLANT
SUT DVC VLOC 3-0 CL 6 P-12 (SUTURE) ×2 IMPLANT
SUT VIC AB 0 CT1 27 (SUTURE) ×2
SUT VIC AB 0 CT1 27XCR 8 STRN (SUTURE) ×1 IMPLANT
SUT VIC AB 2-0 CT1 18 (SUTURE) ×2 IMPLANT
SYR 10ML LL (SYRINGE) ×2 IMPLANT
SYR 20ML LL LF (SYRINGE) ×2 IMPLANT
SYR 30ML LL (SYRINGE) ×4 IMPLANT
SYR 3ML LL SCALE MARK (SYRINGE) ×2 IMPLANT
TOWEL OR 17X26 4PK STRL BLUE (TOWEL DISPOSABLE) ×6 IMPLANT
TUBING CONNECTING 10 (TUBING) ×2 IMPLANT
WATER STERILE IRR 500ML POUR (IV SOLUTION) ×1 IMPLANT

## 2021-07-15 NOTE — Discharge Instructions (Addendum)
Your surgeon has performed an operation on your lumbar spine (low back) to relieve pressure on one or more nerves. Many times, patients feel better immediately after surgery and can overdo it. Even if you feel well, it is important that you follow these activity guidelines. If you do not let your back heal properly from the surgery, you can increase the chance of a disc herniation and/or return of your symptoms. The following are instructions to help in your recovery once you have been discharged from the hospital.   Activity    No bending, lifting, or twisting (BLT). Avoid lifting objects heavier than 10 pounds (gallon milk jug).  Where possible, avoid household activities that involve lifting, bending, pushing, or pulling such as laundry, vacuuming, grocery shopping, and childcare. Try to arrange for help from friends and family for these activities while your back heals.  Increase physical activity slowly as tolerated.  Taking short walks is encouraged, but avoid strenuous exercise. Do not jog, run, bicycle, lift weights, or participate in any other exercises unless specifically allowed by your doctor. Avoid prolonged sitting, including car rides.  Talk to your doctor before resuming sexual activity.  You should not drive until cleared by your doctor.  Until released by your doctor, you should not return to work or school.  You should rest at home and let your body heal.   You may shower two days after your surgery.  After showering, lightly dab your incision dry. Do not take a tub bath or go swimming for 3 weeks, or until approved by your doctor at your follow-up appointment.  If you smoke, we strongly recommend that you quit.  Smoking has been proven to interfere with normal healing in your back and will dramatically reduce the success rate of your surgery. Please contact [facility] (800-QUIT-NOW) and use the resources at www.[facility].com for assistance in stopping smoking.  Surgical  Incision   If you have a dressing on your incision, you may remove it three days after your surgery. Keep your incision area clean and dry.  Your incision was closed with Dermabond glue. Theglue should begin to peel away within about a week.  Diet            You may return to your usual diet. Be sure to stay hydrated.  When to Contact us  Although your surgery and recovery will likely be uneventful, you may have some residual numbness, aches, and pains in your back and/or legs. This is normal and should improve in the next few weeks.  However, should you experience any of the following, contact us immediately: New numbness or weakness Pain that is progressively getting worse, and is not relieved by your pain medications or rest Bleeding, redness, swelling, pain, or drainage from surgical incision Chills or flu-like symptoms Fever greater than 101.0 F (38.3 C) Problems with bowel or bladder functions Difficulty breathing or shortness of breath Warmth, tenderness, or swelling in your calf  Contact Information During office hours (Monday-Friday 9 am to 5 pm), please call your physician at [phone] After hours and weekends, please call [phone] and speak with the answering service, who will contact the doctor on call.  If that fails, call the [facility] Operator at [phone] and ask for the Neurosurgery Resident On Call  For a life-threatening emergency, call 911     AMBULATORY SURGERY  DISCHARGE INSTRUCTIONS   The drugs that you were given will stay in your system until tomorrow so for the next 24 hours you  should not:  Drive an automobile Make any legal decisions Drink any alcoholic beverage   You may resume regular meals tomorrow.  Today it is better to start with liquids and gradually work up to solid foods.  You may eat anything you prefer, but it is better to start with liquids, then soup and crackers, and gradually work up to solid foods.   Please notify your  doctor immediately if you have any unusual bleeding, trouble breathing, redness and pain at the surgery site, drainage, fever, or pain not relieved by medication.      Information for Discharge Teaching:  DO NOT REMOVE TEAL EXPAREL BRACELET FOR 4 DAYS (96 hours) EXPAREL (bupivacaine liposome injectable suspension)   Your surgeon or anesthesiologist gave you EXPAREL(bupivacaine) to help control your pain after surgery.  EXPAREL is a local anesthetic that provides pain relief by numbing the tissue around the surgical site. EXPAREL is designed to release pain medication over time and can control pain for up to 72 hours. Depending on how you respond to EXPAREL, you may require less pain medication during your recovery.  Possible side effects: Temporary loss of sensation or ability to move in the area where bupivacaine was injected. Nausea, vomiting, constipation Rarely, numbness and tingling in your mouth or lips, lightheadedness, or anxiety may occur. Call your doctor right away if you think you may be experiencing any of these sensations, or if you have other questions regarding possible side effects.  Follow all other discharge instructions given to you by your surgeon or nurse. Eat a healthy diet and drink plenty of water or other fluids.  If you return to the hospital for any reason within 96 hours following the administration of EXPAREL, it is important for health care providers to know that you have received this anesthetic. A teal colored band has been placed on your arm with the date, time and amount of EXPAREL you have received in order to alert and inform your health care providers. Please leave this armband in place for the full 96 hours following administration, and then you may remove the band.         Please contact your physician with any problems or Same Day Surgery at [phone], Monday through Friday 6 am to 4 pm, or [facility] number at [phone].

## 2021-07-15 NOTE — Transfer of Care (Signed)
Immediate Anesthesia Transfer of Care Note  Patient: Lindsey Drake  Procedure(s) Performed: LEFT L4-5 MICRODISCECTOMY (Left: Back)  Patient Location: PACU  Anesthesia Type:General  Level of Consciousness: awake, alert  and oriented  Airway & Oxygen Therapy: Patient Spontanous Breathing  Post-op Assessment: Report given to RN and Post -op Vital signs reviewed and stable  Post vital signs: Reviewed and stable  Last Vitals:  Vitals Value Taken Time  BP 130/84 07/15/21 0845  Temp 36.3 C 07/15/21 0839  Pulse 78 07/15/21 0849  Resp 12 07/15/21 0849  SpO2 98 % 07/15/21 0849  Vitals shown include unvalidated device data.  Last Pain:  Vitals:   07/15/21 0839  TempSrc:   PainSc: 0-No pain         Complications: No notable events documented.
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
## 2021-07-15 NOTE — H&P (Signed)
History of Present Illness: 07/15/2021 Lindsey Drake presents today with continued leg pain as noted below.  06/08/2021 Lindsey Drake is here today with a chief complaint of pain in her low back, left buttock, bilateral anterior thighs, intermittent left lateral calf, left ankle, and around the top of her foot. She reports her left leg gives out at times and both ankles are sensitive to touch. For the past week, she is unable to tell when she needs to urinate, but denies saddle anesthesia. She has a history of pelvic prolapse that she had surgery for in November 2021.  She has had issues intermittently since 2020 but has had ongoing symptoms for the last 7 months. She reports sharp burning constant pain. Is worst in her left buttock down her left leg. Any activity such as standing or walking makes it worse. Nothing really helps.  Bowel/Bladder Dysfunction: none  Conservative measures:  Physical therapy: Participated in 1 visit at [facility] 05/13/21 - was told they couldn't help her Multimodal medical therapy including regular antiinflammatories: gabapentin, norco, ibuprofen, meloxicam, methocarbamol, prednisone, tramadol Injections: has received epidural steroid injections 05/05/2021: trigger point injections in the bilateral L5 paraspinal musculature 02/24/2021: Left L4-5 and left L5-S1 transforaminal ESI (2 days of relief, dexamethasone 12mg ) 01/12/2021: Left L4-5 and left L5-S1 transforaminal ESI (3 days of good relief then return of pain) 10/30/2019: Left L4-5 and left L5-S1 transforaminal ESI (good relief for over a year) 07/23/2019: Left L4-5 and left L5-S1 transforaminal ESI (complete relief for over a month) 06/19/2019: Left L5-S1 transforaminal ESI (1 day of relief)  Past Surgery: none  Lindsey Drake has no symptoms of cervical myelopathy.  The symptoms are causing a significant impact on the patient's life.   Review of Systems:  A 10 point review of systems is negative, except  for the pertinent positives and negatives detailed in the HPI.  Past Medical History: Past Medical History:  Diagnosis Date   Abdominal pain, RUQ (right upper quadrant) 11/24/2014   Past Surgical History: Past Surgical History:  Procedure Laterality Date   LAPAROSCOPIC ASSISTED VAGINAL HYSTERECTOMY 2004  ovarie intact fibroid   EGD 01/14/2015  No repeat per RTE   COLONOSCOPY 05/06/2015  Int Hemorrhoids, Diverticulosis: CBF 04/2025   left arthroscopic rotator cuff reapir, biceps tenodesis, subacromial decompression, distal clavicle excision, extensive debridement of shoulder (glenohumeral and sunacromial spaces) Left 06/03/2020  Dr. Posey Pronto   ENDOSCOPIC CARPAL TUNNEL RELEASE   HYSTERECTOMY    Current Meds  Medication Sig   acetaminophen (TYLENOL) 500 MG tablet Take 500 mg by mouth every 6 (six) hours as needed for moderate pain.   busPIRone (BUSPAR) 5 MG tablet Take 1-3 tablets (5-15 mg total) by mouth 2 (two) times daily. (Patient taking differently: Take 5 mg by mouth 2 (two) times daily as needed (anxiety).)   gabapentin (NEURONTIN) 400 MG capsule Take 400 mg by mouth at bedtime.   HYDROcodone-acetaminophen (NORCO/VICODIN) 5-325 MG tablet Take 1 tablet by mouth in the morning and at bedtime.   meloxicam (MOBIC) 15 MG tablet Take 15 mg by mouth daily.   methocarbamol (ROBAXIN) 500 MG tablet Take 500 mg by mouth in the morning and at bedtime.   No Known Allergies   Social History: Social History   Tobacco Use   Smoking status: Never   Smokeless tobacco: Never  Substance Use Topics   Alcohol use: No  Alcohol/week: 0.0 standard drinks   Drug use: No   Family Medical History: Family History  Problem Relation Age of  Onset   High blood pressure (Hypertension) Father   Diabetes type II Father   Physical Examination:  Vitals:   07/15/21 0624  BP: (!) 142/90  Pulse: 83  Resp: 18  Temp: (!) 97.5 F (36.4 C)  SpO2: 97%    Heart sounds normal no MRG. Chest Clear to  Auscultation Bilaterally.   General: Patient is well developed, well nourished, calm, collected, and in no apparent distress. Attention to examination is appropriate.  Psychiatric: Patient is non-anxious.  Head: Pupils equal, round, and reactive to light.  ENT: Oral mucosa appears well hydrated.  Neck: Supple. Full range of motion.  Respiratory: Patient is breathing without any difficulty.  Extremities: No edema.  Vascular: Palpable dorsal pedal pulses.  Skin: On exposed skin, there are no abnormal skin lesions.  NEUROLOGICAL:   Awake, alert, oriented to person, place, and time. Speech is clear and fluent. Fund of knowledge is appropriate.   Cranial Nerves: Pupils equal round and reactive to light. Facial tone is symmetric. Facial sensation is symmetric. Shoulder shrug is symmetric. Tongue protrusion is midline. There is no pronator drift.  ROM of spine: full.  Straight leg raise at 30 degrees positive on the left  Strength: Side Biceps Triceps Deltoid Interossei Grip Wrist Ext. Wrist Flex.  R 5 5 5 5 5 5 5   L 5 5 5 5 5 5 5    Side Iliopsoas Quads Hamstring PF DF EHL  R 5 5 5 5 5 5   L 5 5 5 5 5 5    Reflexes are 1+ and symmetric at the biceps, triceps, brachioradialis, patella and achilles. Hoffman's is absent.  Clonus is not present. Toes are down-going.  Bilateral upper and lower extremity sensation is intact to light touch.  Gait is antalgic.   No evidence of dysmetria noted.  Medical Decision Making  Imaging: MRI L spine 05/12/21 IMPRESSION:  1. L4-L5 intermediate sized left asymmetric disc bulge with  narrowing of the left lateral recess and displacement of the left L5  nerve root, which has worsened slightly from the prior MRI.  Correlate for left L5 radiculopathy.  2. Mild left L4 neural foraminal stenosis.  3. Small left foraminal disc protrusion at L3-L4 without associated  stenosis.   Electronically Signed    By: Ulyses Jarred M.D.    On:  05/13/2021 11:46  I have personally reviewed the images and agree with the above interpretation.  Assessment and Plan: Lindsey Drake is a pleasant 56 y.o. 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  She has failed conservative management and had symptoms for 7 months.  No further conservative management is indicated.  We will proceed with left-sided L4-5 microdiscectomy.  Meade Maw MD, [facility] Department of Neurosurgery

## 2021-07-15 NOTE — Anesthesia Procedure Notes (Signed)
Procedure Name: Intubation Date/Time: 07/15/2021 7:25 AM Performed by: Debe Coder, CRNA Pre-anesthesia Checklist: Patient identified, Emergency Drugs available, Suction available and Patient being monitored Patient Re-evaluated:Patient Re-evaluated prior to induction Oxygen Delivery Method: Circle system utilized Preoxygenation: Pre-oxygenation with 100% oxygen Induction Type: IV induction Ventilation: Mask ventilation without difficulty Laryngoscope Size: McGraph and 3 Grade View: Grade I Tube type: Oral Tube size: 7.0 mm Number of attempts: 1 Airway Equipment and Method: Stylet and Oral airway Placement Confirmation: ETT inserted through vocal cords under direct vision, positive ETCO2 and breath sounds checked- equal and bilateral Secured at: 20 cm Tube secured with: Tape Dental Injury: Teeth and Oropharynx as per pre-operative assessment

## 2021-07-15 NOTE — Anesthesia Postprocedure Evaluation (Signed)
Anesthesia Post Note  Patient: CORA BRIERLEY  Procedure(s) Performed: LEFT L4-5 MICRODISCECTOMY (Left: Back)  Patient location during evaluation: PACU Anesthesia Type: General Level of consciousness: awake and alert Pain management: pain level controlled Vital Signs Assessment: post-procedure vital signs reviewed and stable Respiratory status: spontaneous breathing, nonlabored ventilation, respiratory function stable and patient connected to nasal cannula oxygen Cardiovascular status: blood pressure returned to baseline and stable Postop Assessment: no apparent nausea or vomiting Anesthetic complications: no   No notable events documented.   Last Vitals:  Vitals:   07/15/21 0933 07/15/21 1010  BP: (!) 134/95 (!) 143/88  Pulse: 75 78  Resp: 16 16  Temp: (!) 35.6 C   SpO2: 95% 95%    Last Pain:  Vitals:   07/15/21 1010  TempSrc:   PainSc: 2                  Cleda Mccreedy Suzann Lazaro

## 2021-07-15 NOTE — Progress Notes (Signed)
PHARMACY -  BRIEF ANTIBIOTIC NOTE   Pharmacy has received consult(s) for Cefazolin from an OR provider.  The patient's profile has been reviewed for ht/wt/allergies/indication/available labs.    One time order(s) placed for Cefazolin 2 gm per pt wt: 735. kg  Further antibiotics/pharmacy consults should be ordered by admitting physician if indicated.                       Thank you, Renda Rolls, PharmD, [facility] 07/15/2021 6:37 AM

## 2021-07-15 NOTE — Discharge Summary (Signed)
Physician Discharge Summary  Patient ID: Lindsey Drake MRN: [id number] DOB/AGE: 1965-12-10 56 y.o.  Admit date: 07/15/2021 Discharge date: 07/15/2021  Admission Diagnoses: lumbar radiculopathy  Discharge Diagnoses:  Active Problems:   * No active hospital problems. *   Discharged Condition: good  Hospital Course:  Lindsey Drake is a 56 y.o presenting with left radiating leg pain s/p left L4-5 microdiscectomy. Her interoperative course was uncomplicated and she was discharged home after ambulating, urinating, and tolerating PO intake. She was given prescriptions for Oyxcodone, Robaxin and Senna.  Consults: None  Significant Diagnostic Studies: none   Treatments: surgery: as above. Please see separately dictated operative report for further details  Discharge Exam: Blood pressure (!) 142/90, pulse 83, temperature (!) 97.5 F (36.4 C), temperature source Oral, resp. rate 18, height 5\' 2"  (1.575 m), weight 73.5 kg, SpO2 97 %. CN II-XII grossly intact 5/5 throughout BLE  Incision c/d/I with post-op bandage in place  Disposition: Discharge disposition: 01-Home or Self Care        Allergies as of 07/15/2021   No Known Allergies      Medication List     STOP taking these medications    erythromycin ophthalmic ointment   HYDROcodone-acetaminophen 5-325 MG tablet Commonly known as: NORCO/VICODIN       TAKE these medications    acetaminophen 500 MG tablet Commonly known as: TYLENOL Take 500 mg by mouth every 6 (six) hours as needed for moderate pain.   busPIRone 5 MG tablet Commonly known as: BUSPAR Take 1-3 tablets (5-15 mg total) by mouth 2 (two) times daily. What changed:  how much to take when to take this reasons to take this   escitalopram 10 MG tablet Commonly known as: LEXAPRO TAKE 1/2 TABLET BY MOUTH DAILY FOR 1 WEEK, THEN INCREASE TO 1 TABLET DAILY   gabapentin 400 MG capsule Commonly known as: NEURONTIN Take 400 mg by mouth at bedtime.    meloxicam 15 MG tablet Commonly known as: MOBIC Take 15 mg by mouth daily.   methocarbamol 500 MG tablet Commonly known as: Robaxin Take 1 tablet (500 mg total) by mouth 4 (four) times daily. What changed: when to take this   oxyCODONE 5 MG immediate release tablet Commonly known as: Roxicodone Take 1 tablet (5 mg total) by mouth every 4 (four) hours as needed for up to 5 days for severe pain.   senna 8.6 MG Tabs tablet Commonly known as: SENOKOT Take 1 tablet (8.6 mg total) by mouth daily as needed for mild constipation.        Follow-up Information     Loleta Dicker, PA Follow up in 2 week(s).   Why: Post-op follow up. This should be scheduled with [facility]. Please call the office with any questions or concerns regarding appointment date or time Contact information: [place] [address] [phone]                 Signed: Loleta Dicker 07/15/2021, 8:31 AM

## 2021-07-15 NOTE — Op Note (Signed)
Indications: Lindsey Drake is a 56 yo female with lumbar radiculopathy.  She had worsening symptoms with conservative management prompting surgical intervention.  Findings: L4-5 disc herniation  Preoperative Diagnosis: Lumbar radiculopathy Postoperative Diagnosis: same   EBL: 10 ml IVF: 900 ml Drains: none Disposition: Extubated and Stable to PACU Complications: none  No foley catheter was placed.   Preoperative Note:   Risks of surgery discussed include: infection, bleeding, stroke, coma, death, paralysis, CSF leak, nerve/spinal cord injury, numbness, tingling, weakness, complex regional pain syndrome, recurrent stenosis and/or disc herniation, vascular injury, development of instability, neck/back pain, need for further surgery, persistent symptoms, development of deformity, and the risks of anesthesia. The patient understood these risks and agreed to proceed.  Operative Note:   1) Left L4/5 microdiscectomy  The patient was then brought from the preoperative center with intravenous access established.  The patient underwent general anesthesia and endotracheal tube intubation, and was then rotated on the [place] rail top where all pressure points were appropriately padded.  The skin was then thoroughly cleansed.  Perioperative antibiotic prophylaxis was administered.  Sterile prep and drapes were then applied and a timeout was then observed.  C-arm was brought into the field under sterile conditions, and the L4-5 disc space identified and marked with an incision on the left 1cm lateral to midline.  Once this was complete a 2 cm incision was opened with the use of a #10 blade knife.  The Metrx tubes were sequentially advanced under lateral fluoroscopy until a 18 x 60 mm Metrx tube was placed over the facet and lamina and secured to the bed.    The microscope was then sterilely brought into the field and muscle creep was hemostased with a bipolar and resected with a pituitary rongeur.  A  Bovie extender was then used to expose the spinous process and lamina.  Careful attention was placed to not violate the facet capsule. A 3 mm matchstick drill bit was then used to make a hemi-laminotomy trough until the ligamentum flavum was exposed.  This was extended to the base of the spinous process.  Once this was complete and the underlying ligamentum flavum was visualized this was dissected with an up angle curette and resected with a #2 and #3 mm biting Kerrison.  The laminotomy opening was also expanded in similar fashion and hemostasis was obtained with Surgifoam and a patty as well as bone wax.  The rostral aspect of the caudal level of the lamina was also resected with a #2 biting Kerrison effort to further enhance exposure.  Once the underlying dura was visualized a Penfield 4 was then used to dissect and expose the traversing nerve root.  Once this was identified a nerve root retractor suction was used to mobilize this medially.  The venous plexus was hemostased with Surgifoam and light bipolar use.  A small penfied was then used to make a small annulotomy within the disc space and disc space contents were noted to come through the annulus.    The disc herniation was identified and dissected free using a balltip probe. The pituitary rongeur was used to remove the extruded disc fragments. Once the thecal sac and nerve root were noted to be relaxed and under less tension the ball-tipped feeler was passed along the foramen distally to to ensure no residual compression was noted.    Depo-Medrol was placed along the nerve root.  The area was irrigated. The tube system was then removed under microscopic visualization and hemostasis was obtained  with a bipolar.    The fascial layer was reapproximated with the use of a 0- Vicryl suture.  Subcutaneous tissue layer was reapproximated using 2-0 Vicryl suture.  3-0 monocryl was used on the skin. The skin was then cleansed and Dermabond was used to close the  skin opening.  Patient was then rotated back to the preoperative bed awakened from anesthesia and taken to recovery all counts are correct in this case.   I performed the entire procedure with the assistance of Manning Charity PA as an Designer, television/film set.  Venetia Night MD

## 2021-07-27 ENCOUNTER — Ambulatory Visit: Payer: 59 | Admitting: Family Medicine

## 2021-08-18 ENCOUNTER — Ambulatory Visit (INDEPENDENT_AMBULATORY_CARE_PROVIDER_SITE_OTHER): Payer: 59 | Admitting: Family Medicine

## 2021-08-18 ENCOUNTER — Encounter: Payer: Self-pay | Admitting: Family Medicine

## 2021-08-18 ENCOUNTER — Other Ambulatory Visit: Payer: Self-pay

## 2021-08-18 VITALS — BP 124/84 | HR 111 | Temp 98.0°F | Wt 165.4 lb

## 2021-08-18 DIAGNOSIS — N3001 Acute cystitis with hematuria: Secondary | ICD-10-CM | POA: Diagnosis not present

## 2021-08-18 DIAGNOSIS — R300 Dysuria: Secondary | ICD-10-CM

## 2021-08-18 LAB — URINALYSIS, ROUTINE W REFLEX MICROSCOPIC
Bilirubin, UA: NEGATIVE
Glucose, UA: NEGATIVE
Ketones, UA: NEGATIVE
Nitrite, UA: NEGATIVE
Specific Gravity, UA: >1.03 — ABNORMAL HIGH (ref 1.005–1.030)
Urobilinogen, Ur: 0.2 mg/dL (ref 0.2–1.0)
pH, UA: 5.5 (ref 5.0–7.5)

## 2021-08-18 LAB — MICROSCOPIC EXAMINATION

## 2021-08-18 MED ORDER — NITROFURANTOIN MONOHYD MACRO 100 MG PO CAPS
100.0000 mg | ORAL_CAPSULE | Freq: Two times a day (BID) | ORAL | 0 refills | Status: DC
Start: 2021-08-18 — End: 2021-09-14

## 2021-08-18 NOTE — Progress Notes (Signed)
? ?BP 124/84   Pulse (!) 111   Temp 98 ?F (36.7 ?C)   Wt 165 lb 6.4 oz (75 kg)   SpO2 97%   BMI 30.25 kg/m?   ? ?Subjective:  ? ? Patient ID: Lindsey Drake, female    DOB: 1965-10-22, 56 y.o.   MRN: [id number] ? ?HPI: ?Lindsey Drake is a 56 y.o. female ? ?Chief Complaint  ?Patient presents with  ? Urinary Tract Infection  ?  Patient states she has been having pain with urination for about a week, urine is cloudy and dark.   ? ?URINARY SYMPTOMS ?Duration: about a week ?Dysuria: yes ?Urinary frequency: yes ?Urgency: yes ?Small volume voids: no ?Symptom severity: mild ?Urinary incontinence: no ?Foul odor: no ?Hematuria: no ?Abdominal pain: no ?Back pain: no ?Suprapubic pain/pressure: no ?Flank pain: no ?Fever:  no ?Vomiting: no ?Relief with cranberry juice: no ?Relief with pyridium: no ?Status: stable ?Previous urinary tract infection: no ?Recurrent urinary tract infection: no ?Sexual activity: monogomous ?History of sexually transmitted disease: no ?Vaginal discharge: no ?Treatments attempted: increasing fluids  ? ? ?Relevant past medical, surgical, family and social history reviewed and updated as indicated. Interim medical history since our last visit reviewed. ?Allergies and medications reviewed and updated. ? ?Review of Systems  ?Constitutional: Negative.   ?Respiratory: Negative.    ?Cardiovascular: Negative.   ?Gastrointestinal: Negative.   ?Genitourinary:  Positive for dysuria. Negative for decreased urine volume, difficulty urinating, dyspareunia, enuresis, flank pain, frequency, genital sores, hematuria, menstrual problem, pelvic pain, urgency, vaginal bleeding, vaginal discharge and vaginal pain.  ?Musculoskeletal: Negative.   ?Psychiatric/Behavioral: Negative.    ? ?Per HPI unless specifically indicated above ? ?   ?Objective:  ?  ?BP 124/84   Pulse (!) 111   Temp 98 ?F (36.7 ?C)   Wt 165 lb 6.4 oz (75 kg)   SpO2 97%   BMI 30.25 kg/m?   ?Wt Readings from Last 3 Encounters:  ?08/18/21 165 lb 6.4  oz (75 kg)  ?07/15/21 162 lb (73.5 kg)  ?07/06/21 166 lb 3.6 oz (75.4 kg)  ?  ?Physical Exam ?Vitals and nursing note reviewed.  ?Constitutional:   ?   General: She is not in acute distress. ?   Appearance: Normal appearance. She is not ill-appearing, toxic-appearing or diaphoretic.  ?HENT:  ?   Head: Normocephalic and atraumatic.  ?   Right Ear: External ear normal.  ?   Left Ear: External ear normal.  ?   Nose: Nose normal.  ?   Mouth/Throat:  ?   Mouth: Mucous membranes are moist.  ?   Pharynx: Oropharynx is clear.  ?Eyes:  ?   General: No scleral icterus.    ?   Right eye: No discharge.     ?   Left eye: No discharge.  ?   Extraocular Movements: Extraocular movements intact.  ?   Conjunctiva/sclera: Conjunctivae normal.  ?   Pupils: Pupils are equal, round, and reactive to light.  ?Cardiovascular:  ?   Rate and Rhythm: Normal rate and regular rhythm.  ?   Pulses: Normal pulses.  ?   Heart sounds: Normal heart sounds. No murmur heard. ?  No friction rub. No gallop.  ?Pulmonary:  ?   Effort: Pulmonary effort is normal. No respiratory distress.  ?   Breath sounds: Normal breath sounds. No stridor. No wheezing, rhonchi or rales.  ?Chest:  ?   Chest wall: No tenderness.  ?Musculoskeletal:     ?  General: Normal range of motion.  ?   Cervical back: Normal range of motion and neck supple.  ?Skin: ?   General: Skin is warm and dry.  ?   Capillary Refill: Capillary refill takes less than 2 seconds.  ?   Coloration: Skin is not jaundiced or pale.  ?   Findings: No bruising, erythema, lesion or rash.  ?Neurological:  ?   General: No focal deficit present.  ?   Mental Status: She is alert and oriented to person, place, and time. Mental status is at baseline.  ?Psychiatric:     ?   Mood and Affect: Mood normal.     ?   Behavior: Behavior normal.     ?   Thought Content: Thought content normal.     ?   Judgment: Judgment normal.  ? ? ?Results for orders placed or performed in visit on 08/18/21  ?Microscopic Examination  ?  Urine  ?Result Value Ref Range  ? WBC, UA 6-10 (A) 0 - 5 /hpf  ? RBC 11-30R 0 - 2 /hpf  ? Epithelial Cells (non renal) 0-10 0 - 10 /hpf  ? Casts Present (A) None seen /lpf  ? Cast Type Hyaline casts N/A  ? Mucus, UA Present (A) Not Estab.  ? Bacteria, UA Moderate (A) None seen/Few  ?Urinalysis, Routine w reflex microscopic  ?Result Value Ref Range  ? Specific Gravity, UA >1.030 (H) 1.005 - 1.030  ? pH, UA 5.5 5.0 - 7.5  ? Color, UA Yellow Yellow  ? Appearance Ur Cloudy (A) Clear  ? Leukocytes,UA Trace (A) Negative  ? Protein,UA 2+ (A) Negative/Trace  ? Glucose, UA Negative Negative  ? Ketones, UA Negative Negative  ? RBC, UA 2+ (A) Negative  ? Bilirubin, UA Negative Negative  ? Urobilinogen, Ur 0.2 0.2 - 1.0 mg/dL  ? Nitrite, UA Negative Negative  ? Microscopic Examination See below:   ? ?   ?Assessment & Plan:  ? ?Problem List Items Addressed This Visit   ?None ?Visit Diagnoses   ? ? Acute cystitis with hematuria    -  Primary  ? +Leuks. Will send urine culture and treat with macrobid. Call if not getting better or getting worse.   ? Relevant Orders  ? Urine Culture  ? Dysuria      ? Relevant Orders  ? Urinalysis, Routine w reflex microscopic (Completed)  ? ?  ?  ? ?Follow up plan: ?Return if symptoms worsen or fail to improve. ? ? ? ? ? ?

## 2021-08-22 LAB — URINE CULTURE

## 2021-09-09 ENCOUNTER — Ambulatory Visit: Payer: Self-pay | Admitting: *Deleted

## 2021-09-09 NOTE — Telephone Encounter (Signed)
Summary: UTI symptoms still present  ? Patient was seen on 08/18/2021 for a UTI and states the medication that was prescribed is not working. Patient does not know the name of the medication prescribed but would like PCP to prescribe a new medication. Patient experiencing burning while urinating.   ?  ? ?Voicemail left for pt. ?

## 2021-09-09 NOTE — Telephone Encounter (Signed)
?  Chief Complaint: UTI symptoms back ?Symptoms: burning ?Frequency: no ?Pertinent Negatives: Patient denies fever ?Disposition: [] ED /[] Urgent Care (no appt availability in office) / [] Appointment(In office/virtual)/ []  [facility]/ [] Home Care/ [] Refused Recommended Disposition /[] [facility]/ [x]  Follow-up with PCP ?Additional Notes: Pt seen in office 08/18/21, OV note states if not improved to call back. She has finished the antibiotic and the symptoms never went away completely but now that are back again. Requesting new med to be called in. ? ?Reason for Disposition ? [1] Taking antibiotic > 72 hours (3 days) for UTI AND [2] painful urination or frequency not improved ? ?Answer Assessment - Initial Assessment Questions ?1. ANTIBIOTIC: "What antibiotic are you taking?" "How many times per day?" ?    Finished antibiotic and symptoms never went away and now worse ?2. DURATION: "When was the antibiotic started?" ?    At last appt ?3. MAIN SYMPTOM: "What is the main symptom you are concerned about?" ?    dysuria ?4. FEVER: "Do you have a fever?" If Yes, ask: "What is it, how was it measured, and when did it start?" ?    no ?5. OTHER SYMPTOMS: "Do you have any other symptoms?" (e.g., flank pain, vaginal discharge, blood in urine) ?    no ? ?Protocols used: Urinary Tract Infection on Antibiotic Follow-up Call - [facility] ? ?

## 2021-09-11 NOTE — Telephone Encounter (Signed)
Please let her know that her urine was sensitive to the antibiotic she was on. She is also 3 weeks out from her last UTI. I would recommend we see her to repeat her urine to make sure she doesn't have a different UTI or a vaginal infection.  ?

## 2021-09-11 NOTE — Telephone Encounter (Signed)
Left VM for patient to return call

## 2021-09-11 NOTE — Telephone Encounter (Signed)
Gave patient Dr. Henriette Combs message, she verbalized understanding. Scheduled patient for appt on Monday @ 3:20 ?

## 2021-09-14 ENCOUNTER — Encounter: Payer: Self-pay | Admitting: Family Medicine

## 2021-09-14 ENCOUNTER — Ambulatory Visit (INDEPENDENT_AMBULATORY_CARE_PROVIDER_SITE_OTHER): Payer: 59 | Admitting: Family Medicine

## 2021-09-14 VITALS — BP 113/81 | HR 101 | Temp 98.1°F | Wt 166.0 lb

## 2021-09-14 DIAGNOSIS — R3989 Other symptoms and signs involving the genitourinary system: Secondary | ICD-10-CM

## 2021-09-14 DIAGNOSIS — R300 Dysuria: Secondary | ICD-10-CM

## 2021-09-14 DIAGNOSIS — R3129 Other microscopic hematuria: Secondary | ICD-10-CM

## 2021-09-14 LAB — WET PREP FOR TRICH, YEAST, CLUE
Clue Cell Exam: NEGATIVE
Trichomonas Exam: NEGATIVE
Yeast Exam: NEGATIVE

## 2021-09-14 LAB — MICROSCOPIC EXAMINATION
Bacteria, UA: NONE SEEN
WBC, UA: NONE SEEN /hpf (ref 0–5)

## 2021-09-14 LAB — URINALYSIS, ROUTINE W REFLEX MICROSCOPIC
Bilirubin, UA: NEGATIVE
Glucose, UA: NEGATIVE
Ketones, UA: NEGATIVE
Leukocytes,UA: NEGATIVE
Nitrite, UA: NEGATIVE
Specific Gravity, UA: >1.03 — ABNORMAL HIGH (ref 1.005–1.030)
Urobilinogen, Ur: 0.2 mg/dL (ref 0.2–1.0)
pH, UA: 5.5 (ref 5.0–7.5)

## 2021-09-14 MED ORDER — NORTRIPTYLINE HCL 25 MG PO CAPS: 25.0000 mg | ORAL_CAPSULE | Freq: Every day | ORAL | 2 refills | Status: DC

## 2021-09-14 NOTE — Progress Notes (Signed)
? ?BP 113/81   Pulse (!) 101   Temp 98.1 ?F (36.7 ?C)   Wt 166 lb (75.3 kg)   SpO2 98%   BMI 30.36 kg/m?   ? ?Subjective:  ? ? Patient ID: Lindsey Drake, female    DOB: Jul 20, 1965, 56 y.o.   MRN: [id number] ? ?HPI: ?Lindsey Drake is a 56 y.o. female ? ?Chief Complaint  ?Patient presents with  ? Urinary Tract Infection  ?  Patient states she is still having burning with urination and abdominal pain  ? ?URINARY SYMPTOMS ?Duration: 2 weeks ?Dysuria: yes ?Urinary frequency: no ?Urgency: yes ?Small volume voids: no ?Symptom severity: moderate ?Urinary incontinence: no ?Foul odor: no ?Hematuria: no ?Abdominal pain: yes ?Back pain: no ?Suprapubic pain/pressure: yes ?Flank pain: no ?Fever:  no ?Vomiting: no ?Relief with cranberry juice: no ?Relief with pyridium: no ?Status: stable ?Previous urinary tract infection: yes ?Recurrent urinary tract infection: no ?Sexual activity: monogomous ?History of sexually transmitted disease: no ?Vaginal discharge: no ?Treatments attempted: antibiotics  ? ? ?Relevant past medical, surgical, family and social history reviewed and updated as indicated. Interim medical history since our last visit reviewed. ?Allergies and medications reviewed and updated. ? ?Review of Systems  ?Constitutional: Negative.   ?Respiratory: Negative.    ?Cardiovascular: Negative.   ?Genitourinary:  Positive for dysuria. Negative for decreased urine volume, difficulty urinating, dyspareunia, enuresis, flank pain, frequency, genital sores, hematuria, menstrual problem, pelvic pain, urgency, vaginal bleeding, vaginal discharge and vaginal pain.  ?Musculoskeletal: Negative.   ?Psychiatric/Behavioral: Negative.    ? ?Per HPI unless specifically indicated above ? ?   ?Objective:  ?  ?BP 113/81   Pulse (!) 101   Temp 98.1 ?F (36.7 ?C)   Wt 166 lb (75.3 kg)   SpO2 98%   BMI 30.36 kg/m?   ?Wt Readings from Last 3 Encounters:  ?09/14/21 166 lb (75.3 kg)  ?08/18/21 165 lb 6.4 oz (75 kg)  ?07/15/21 162 lb (73.5 kg)   ?  ?Physical Exam ?Vitals and nursing note reviewed.  ?Constitutional:   ?   General: She is not in acute distress. ?   Appearance: Normal appearance. She is not ill-appearing, toxic-appearing or diaphoretic.  ?HENT:  ?   Head: Normocephalic and atraumatic.  ?   Right Ear: External ear normal.  ?   Left Ear: External ear normal.  ?   Nose: Nose normal.  ?   Mouth/Throat:  ?   Mouth: Mucous membranes are moist.  ?   Pharynx: Oropharynx is clear.  ?Eyes:  ?   General: No scleral icterus.    ?   Right eye: No discharge.     ?   Left eye: No discharge.  ?   Extraocular Movements: Extraocular movements intact.  ?   Conjunctiva/sclera: Conjunctivae normal.  ?   Pupils: Pupils are equal, round, and reactive to light.  ?Cardiovascular:  ?   Rate and Rhythm: Normal rate and regular rhythm.  ?   Pulses: Normal pulses.  ?   Heart sounds: Normal heart sounds. No murmur heard. ?  No friction rub. No gallop.  ?Pulmonary:  ?   Effort: Pulmonary effort is normal. No respiratory distress.  ?   Breath sounds: Normal breath sounds. No stridor. No wheezing, rhonchi or rales.  ?Chest:  ?   Chest wall: No tenderness.  ?Musculoskeletal:     ?   General: Normal range of motion.  ?   Cervical back: Normal range of motion and neck supple.  ?  Skin: ?   General: Skin is warm and dry.  ?   Capillary Refill: Capillary refill takes less than 2 seconds.  ?   Coloration: Skin is not jaundiced or pale.  ?   Findings: No bruising, erythema, lesion or rash.  ?Neurological:  ?   General: No focal deficit present.  ?   Mental Status: She is alert and oriented to person, place, and time. Mental status is at baseline.  ?Psychiatric:     ?   Mood and Affect: Mood normal.     ?   Behavior: Behavior normal.     ?   Thought Content: Thought content normal.     ?   Judgment: Judgment normal.  ? ? ?Results for orders placed or performed in visit on 08/18/21  ?Microscopic Examination  ? Urine  ?Result Value Ref Range  ? WBC, UA 6-10 (A) 0 - 5 /hpf  ? RBC 11-30R  0 - 2 /hpf  ? Epithelial Cells (non renal) 0-10 0 - 10 /hpf  ? Casts Present (A) None seen /lpf  ? Cast Type Hyaline casts N/A  ? Mucus, UA Present (A) Not Estab.  ? Bacteria, UA Moderate (A) None seen/Few  ?Urine Culture  ? Specimen: Urine  ? UR  ?Result Value Ref Range  ? Urine Culture, Routine Final report (A)   ? Organism ID, Bacteria Escherichia coli (A)   ? Antimicrobial Susceptibility Comment   ?Urinalysis, Routine w reflex microscopic  ?Result Value Ref Range  ? Specific Gravity, UA >1.030 (H) 1.005 - 1.030  ? pH, UA 5.5 5.0 - 7.5  ? Color, UA Yellow Yellow  ? Appearance Ur Cloudy (A) Clear  ? Leukocytes,UA Trace (A) Negative  ? Protein,UA 2+ (A) Negative/Trace  ? Glucose, UA Negative Negative  ? Ketones, UA Negative Negative  ? RBC, UA 2+ (A) Negative  ? Bilirubin, UA Negative Negative  ? Urobilinogen, Ur 0.2 0.2 - 1.0 mg/dL  ? Nitrite, UA Negative Negative  ? Microscopic Examination See below:   ? ?   ?Assessment & Plan:  ? ?Problem List Items Addressed This Visit   ?None ?Visit Diagnoses   ? ? Dysuria    -  Primary  ? UA clear except trace blood. Will check culture. Concern for IC. Diet discussed referral to uro placed and start nortripytline. Recheck 1 month.   ? Relevant Orders  ? Urinalysis, Routine w reflex microscopic  ? WET PREP FOR TRICH, YEAST, CLUE  ? Other microscopic hematuria      ? Relevant Orders  ? Urine Culture  ? Ambulatory referral to Urology  ? Bladder pain      ? Relevant Orders  ? Ambulatory referral to Urology  ? ?  ?  ? ?Follow up plan: ?Return in about 4 weeks (around 10/12/2021). ? ? ? ? ? ?

## 2021-09-17 ENCOUNTER — Encounter: Payer: Self-pay | Admitting: *Deleted

## 2021-10-12 ENCOUNTER — Ambulatory Visit: Payer: 59 | Admitting: Family Medicine

## 2021-10-28 ENCOUNTER — Other Ambulatory Visit: Payer: Self-pay | Admitting: Neurosurgery

## 2021-10-28 DIAGNOSIS — M5416 Radiculopathy, lumbar region: Secondary | ICD-10-CM

## 2021-11-02 ENCOUNTER — Ambulatory Visit (INDEPENDENT_AMBULATORY_CARE_PROVIDER_SITE_OTHER): Payer: 59 | Admitting: Urology

## 2021-11-02 VITALS — BP 170/114 | HR 120 | Ht 62.0 in | Wt 166.0 lb

## 2021-11-02 DIAGNOSIS — R350 Frequency of micturition: Secondary | ICD-10-CM | POA: Diagnosis not present

## 2021-11-02 DIAGNOSIS — R3121 Asymptomatic microscopic hematuria: Secondary | ICD-10-CM | POA: Diagnosis not present

## 2021-11-02 DIAGNOSIS — N816 Rectocele: Secondary | ICD-10-CM | POA: Diagnosis not present

## 2021-11-02 LAB — MICROSCOPIC EXAMINATION: Bacteria, UA: NONE SEEN

## 2021-11-02 LAB — URINALYSIS, COMPLETE
Bilirubin, UA: NEGATIVE
Glucose, UA: NEGATIVE
Ketones, UA: NEGATIVE
Leukocytes,UA: NEGATIVE
Nitrite, UA: NEGATIVE
Protein,UA: NEGATIVE
Specific Gravity, UA: 1.01 (ref 1.005–1.030)
Urobilinogen, Ur: 0.2 mg/dL (ref 0.2–1.0)
pH, UA: 6 (ref 5.0–7.5)
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
## 2021-11-05 LAB — CULTURE, URINE COMPREHENSIVE

## 2021-11-07 ENCOUNTER — Ambulatory Visit
Admission: RE | Admit: 2021-11-07 | Discharge: 2021-11-07 | Disposition: A | Discharge status: Home / Self Care | Payer: 59 | Source: Ambulatory Visit | Attending: Neurosurgery | Admitting: Neurosurgery

## 2021-11-07 DIAGNOSIS — M5416 Radiculopathy, lumbar region: Secondary | ICD-10-CM | POA: Insufficient documentation

## 2021-11-07 MED ORDER — GADOBUTROL 1 MMOL/ML IV SOLN
7.0000 mL | Freq: Once | INTRAVENOUS | Status: AC | PRN
  Administered 2021-11-07: 7.5 mL via INTRAVENOUS

## 2021-11-26 ENCOUNTER — Telehealth: Payer: Self-pay

## 2021-11-26 NOTE — Telephone Encounter (Signed)
-----   Message from Rockey Situ sent at 11/26/2021  8:53 AM EDT ----- Regarding: pain med refill Contact: [phone] Oxycodone 5/325mg  [facility] Lindsey Drake She has not scheduled her EMG yet due to family issues but she will call them now.

## 2021-11-27 NOTE — Telephone Encounter (Signed)
Can you please notify her of the message below.

## 2021-11-27 NOTE — Telephone Encounter (Signed)
12/09/2021 EMG

## 2021-11-30 ENCOUNTER — Other Ambulatory Visit: Payer: Self-pay | Admitting: Family Medicine

## 2021-11-30 NOTE — Telephone Encounter (Signed)
Noted  

## 2021-12-29 DIAGNOSIS — M5432 Sciatica, left side: Secondary | ICD-10-CM | POA: Diagnosis not present

## 2021-12-29 DIAGNOSIS — M9903 Segmental and somatic dysfunction of lumbar region: Secondary | ICD-10-CM | POA: Diagnosis not present

## 2021-12-29 DIAGNOSIS — M9901 Segmental and somatic dysfunction of cervical region: Secondary | ICD-10-CM | POA: Diagnosis not present

## 2021-12-29 DIAGNOSIS — M62830 Muscle spasm of back: Secondary | ICD-10-CM | POA: Diagnosis not present

## 2021-12-30 DIAGNOSIS — M62830 Muscle spasm of back: Secondary | ICD-10-CM | POA: Diagnosis not present

## 2021-12-30 DIAGNOSIS — M9901 Segmental and somatic dysfunction of cervical region: Secondary | ICD-10-CM | POA: Diagnosis not present

## 2021-12-30 DIAGNOSIS — M5432 Sciatica, left side: Secondary | ICD-10-CM | POA: Diagnosis not present

## 2021-12-30 DIAGNOSIS — M9903 Segmental and somatic dysfunction of lumbar region: Secondary | ICD-10-CM | POA: Diagnosis not present

## 2021-12-31 DIAGNOSIS — M62830 Muscle spasm of back: Secondary | ICD-10-CM | POA: Diagnosis not present

## 2021-12-31 DIAGNOSIS — M9901 Segmental and somatic dysfunction of cervical region: Secondary | ICD-10-CM | POA: Diagnosis not present

## 2021-12-31 DIAGNOSIS — M5432 Sciatica, left side: Secondary | ICD-10-CM | POA: Diagnosis not present

## 2021-12-31 DIAGNOSIS — M9903 Segmental and somatic dysfunction of lumbar region: Secondary | ICD-10-CM | POA: Diagnosis not present

## 2022-01-04 DIAGNOSIS — M62830 Muscle spasm of back: Secondary | ICD-10-CM | POA: Diagnosis not present

## 2022-01-04 DIAGNOSIS — M9901 Segmental and somatic dysfunction of cervical region: Secondary | ICD-10-CM | POA: Diagnosis not present

## 2022-01-04 DIAGNOSIS — M9903 Segmental and somatic dysfunction of lumbar region: Secondary | ICD-10-CM | POA: Diagnosis not present

## 2022-01-04 DIAGNOSIS — M5432 Sciatica, left side: Secondary | ICD-10-CM | POA: Diagnosis not present

## 2022-01-07 DIAGNOSIS — M9903 Segmental and somatic dysfunction of lumbar region: Secondary | ICD-10-CM | POA: Diagnosis not present

## 2022-01-07 DIAGNOSIS — M62830 Muscle spasm of back: Secondary | ICD-10-CM | POA: Diagnosis not present

## 2022-01-07 DIAGNOSIS — M9901 Segmental and somatic dysfunction of cervical region: Secondary | ICD-10-CM | POA: Diagnosis not present

## 2022-01-07 DIAGNOSIS — M5432 Sciatica, left side: Secondary | ICD-10-CM | POA: Diagnosis not present

## 2022-01-11 ENCOUNTER — Other Ambulatory Visit: Payer: 59 | Admitting: Urology

## 2022-01-13 ENCOUNTER — Telehealth: Payer: Self-pay

## 2022-01-13 NOTE — Telephone Encounter (Signed)
-----   Message from Rockey Situ sent at 01/13/2022  3:00 PM EDT ----- Regarding: pain med Contact: [phone] She is asking for pain med refill and she is asking if we have her EMG results from 12/09/2021. I do not see anything in her chart. She said that she has called them several times to request results. She would like for our office to call and see if that will speed the process. I will call Neurology office.  Hydrocodone [facility] [place] [place]

## 2022-01-14 ENCOUNTER — Other Ambulatory Visit: Payer: Self-pay | Admitting: Neurosurgery

## 2022-01-14 MED ORDER — HYDROCODONE-ACETAMINOPHEN 5-325 MG PO TABS: 1.0000 | ORAL_TABLET | Freq: Two times a day (BID) | ORAL | 0 refills | Status: DC | PRN

## 2022-01-14 NOTE — Telephone Encounter (Signed)
The EMG report is scanned in the system now under Encounters dated 12/09/21,

## 2022-01-21 ENCOUNTER — Telehealth: Payer: Self-pay | Admitting: Neurosurgery

## 2022-01-21 NOTE — Telephone Encounter (Signed)
LVM

## 2022-01-22 ENCOUNTER — Encounter: Payer: Self-pay | Admitting: Neurosurgery

## 2022-01-22 NOTE — Telephone Encounter (Signed)
She scheduled for 2 weeks on 02/04/2022. Can you give her something for pain to get her through to the appt?

## 2022-01-28 ENCOUNTER — Encounter: Payer: Self-pay | Admitting: Family Medicine

## 2022-01-28 ENCOUNTER — Ambulatory Visit (INDEPENDENT_AMBULATORY_CARE_PROVIDER_SITE_OTHER): Payer: 59 | Admitting: Family Medicine

## 2022-01-28 ENCOUNTER — Other Ambulatory Visit: Payer: Self-pay | Admitting: Neurosurgery

## 2022-01-28 VITALS — BP 149/93 | HR 91 | Temp 98.1°F | Wt 166.9 lb

## 2022-01-28 DIAGNOSIS — N8110 Cystocele, unspecified: Secondary | ICD-10-CM | POA: Diagnosis not present

## 2022-01-28 DIAGNOSIS — M4726 Other spondylosis with radiculopathy, lumbar region: Secondary | ICD-10-CM

## 2022-01-28 DIAGNOSIS — F411 Generalized anxiety disorder: Secondary | ICD-10-CM | POA: Diagnosis not present

## 2022-01-28 DIAGNOSIS — R69 Illness, unspecified: Secondary | ICD-10-CM | POA: Diagnosis not present

## 2022-01-28 MED ORDER — METHOCARBAMOL 500 MG PO TABS: 500.0000 mg | ORAL_TABLET | Freq: Four times a day (QID) | ORAL | 3 refills | Status: DC

## 2022-01-28 MED ORDER — DULOXETINE HCL 20 MG PO CPEP: ORAL_CAPSULE | ORAL | 3 refills | Status: DC

## 2022-01-28 MED ORDER — HYDROCODONE-ACETAMINOPHEN 5-325 MG PO TABS: 1.0000 | ORAL_TABLET | Freq: Two times a day (BID) | ORAL | 0 refills | Status: AC | PRN

## 2022-01-28 MED ORDER — MELOXICAM 15 MG PO TABS: 15.0000 mg | ORAL_TABLET | Freq: Every day | ORAL | 1 refills | Status: DC

## 2022-01-28 MED ORDER — POLYETHYLENE GLYCOL 3350 17 GM/SCOOP PO POWD: 17.0000 g | Freq: Two times a day (BID) | ORAL | 1 refills | Status: DC | PRN

## 2022-01-28 NOTE — Telephone Encounter (Signed)
Left  message on v/m 01/28/22.
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
## 2022-01-29 DIAGNOSIS — M4726 Other spondylosis with radiculopathy, lumbar region: Secondary | ICD-10-CM | POA: Insufficient documentation

## 2022-01-29 NOTE — Assessment & Plan Note (Signed)
Will get her started on cymbalta and recheck 1 month. Call with any concerns.

## 2022-01-29 NOTE — Assessment & Plan Note (Addendum)
Will get her into PT and per her request in for 2nd opinion from neurosurgery. Restart meloxicam and methocarbamol. Will start cymbalta and recheck 1 month to see how she's doing. Continue to monitor.

## 2022-02-04 ENCOUNTER — Encounter: Payer: Self-pay | Admitting: Neurosurgery

## 2022-02-04 ENCOUNTER — Ambulatory Visit: Payer: 59 | Admitting: Neurosurgery

## 2022-02-04 VITALS — BP 145/92 | HR 97 | Ht 62.0 in | Wt 163.8 lb

## 2022-02-04 DIAGNOSIS — M5416 Radiculopathy, lumbar region: Secondary | ICD-10-CM

## 2022-02-04 NOTE — Progress Notes (Addendum)
   DOS: 07/15/21 left L4-5 microdiscectomy   HISTORY OF PRESENT ILLNESS:  02/04/2022 Lindsey Drake is status post microdiscectomy.  She is not doing well.  She has continued symptoms of chronic radiculopathy.  Her severe sciatica is gone, but she now has burning discomfort throughout her left leg.  Her discomfort is now constant PHYSICAL EXAMINATION:   Vitals:   02/04/22 1500  BP: (!) 145/92  Pulse: 97   General: Patient is well developed, well nourished, calm, collected, and in no apparent distress.  NEUROLOGICAL:  General: In no acute distress.  Awake, alert, oriented to person, place, and time. Pupils equal round and reactive to light.   Strength:  Side Iliopsoas Quads Hamstring PF DF EHL  R 5 5 5 5 5 5   L 5 5 5 5 5 5    Incision c/d/i   ROS (Neurologic): Negative except as noted above  IMAGING: Repeat MRI reviewed-no residual nerve compression  EMG from July 2023 shows chronic left L5 radiculopathy  ASSESSMENT/PLAN:  Lindsey Drake is doing poorly after microdiscectomy.  She has chronic radiculopathy on the left side.  I suspect that most if not all of her symptoms are from this.  She has no residual compression on MRI scan.  I recommended that she consider medical management versus spinal cord stimulation.  I think spinal cord stimulation is most appropriate.  I expressed my apologies for the length of time that it took for August 2023 to get her results of her nerve conduction study.  We had some difficulty in communication after the results were received.  I am hopeful that she will give some consideration to a spinal cord stimulator evaluation.  I also offered a second opinion.  I would be happy to arrange that if she would like.  I have reviewed her thoracic MRI scan from 2018.  It shows no significant compression in the area necessary for spinal cord stimulator placement.  She would not need reevaluation before stimulator evaluation.  I spent a total of 30 minutes in  face-to-face and non-face-to-face activities related to this patient's care today.   [place] MD, [facility] Department of Neurosurgery

## 2022-02-25 ENCOUNTER — Encounter: Payer: Self-pay | Admitting: Family Medicine

## 2022-02-25 ENCOUNTER — Ambulatory Visit (INDEPENDENT_AMBULATORY_CARE_PROVIDER_SITE_OTHER): Payer: 59 | Admitting: Family Medicine

## 2022-02-25 VITALS — BP 128/89 | HR 84 | Temp 98.1°F | Wt 166.0 lb

## 2022-02-25 DIAGNOSIS — M4726 Other spondylosis with radiculopathy, lumbar region: Secondary | ICD-10-CM

## 2022-02-25 DIAGNOSIS — Z23 Encounter for immunization: Secondary | ICD-10-CM

## 2022-02-25 DIAGNOSIS — M47816 Spondylosis without myelopathy or radiculopathy, lumbar region: Secondary | ICD-10-CM | POA: Diagnosis not present

## 2022-02-25 DIAGNOSIS — F411 Generalized anxiety disorder: Secondary | ICD-10-CM | POA: Diagnosis not present

## 2022-02-25 DIAGNOSIS — R69 Illness, unspecified: Secondary | ICD-10-CM | POA: Diagnosis not present

## 2022-02-25 MED ORDER — PREGABALIN 75 MG PO CAPS: ORAL_CAPSULE | ORAL | 2 refills | Status: DC

## 2022-02-25 NOTE — Assessment & Plan Note (Signed)
Awaiting 2nd opinion with neurosurgery. Dr. Cari Caraway recommends spinal cord stimulator which she would like to hold off on if possible. Could not tolerate cymbalta. Will change to lyrica and recheck 4-6 weeks. Call with any concerns.

## 2022-02-25 NOTE — Assessment & Plan Note (Signed)
Did not tolerate cymbalta. Will stop it. Call with any concerns.

## 2022-02-25 NOTE — Progress Notes (Signed)
BP 128/89   Pulse 84   Temp 98.1 F (36.7 C)   Wt 166 lb (75.3 kg)   SpO2 98%   BMI 30.36 kg/m    Subjective:    Patient ID: Lindsey Drake, female    DOB: Oct 10, 1965, 56 y.o.   MRN: [id number]  HPI: Lindsey Drake is a 56 y.o. female  Chief Complaint  Patient presents with   Anxiety   Osteoarthritis of spine with radiculopathy, lumbar region     Patient states she has not got in with PT yet due to being on vacation. Has not heard from neurosurgery at all.    ANXIETY/STRESS Duration: chronic Status:exacerbated Anxious mood: yes  Excessive worrying: yes Irritability: yes  Sweating: no Nausea: no Palpitations:no Hyperventilation: no Panic attacks: no Agoraphobia: no  Obscessions/compulsions: no Depressed mood: yes    02/25/2022    3:38 PM 01/28/2022    3:36 PM 09/14/2021    3:21 PM 08/18/2021    2:58 PM 05/28/2021    3:08 PM  Depression screen PHQ 2/9  Decreased Interest 3 3 3 3 3   Down, Depressed, Hopeless 3 3 1 2 3   PHQ - 2 Score 6 6 4 5 6   Altered sleeping 3 3 3 3 3   Tired, decreased energy 3 3 2 3 3   Change in appetite 3 3 3 3 3   Feeling bad or failure about yourself  0 0 0 0 0  Trouble concentrating 0 2 0 0 0  Moving slowly or fidgety/restless 0 2 0 0 0  Suicidal thoughts 0 0 0 0 0  PHQ-9 Score 15 19 12 14 15   Difficult doing work/chores Very difficult Very difficult         02/25/2022    3:38 PM 01/28/2022    3:36 PM 09/14/2021    3:22 PM 08/18/2021    2:58 PM  GAD 7 : Generalized Anxiety Score  Nervous, Anxious, on Edge 1 3 3 3   Control/stop worrying 2 3 3 3   Worry too much - different things 2 3 3 3   Trouble relaxing 2 3 3 1   Restless 3 2 2  0  Easily annoyed or irritable 3 3 3 3   Afraid - awful might happen 0 0 0 0  Total GAD 7 Score 13 17 17 13   Anxiety Difficulty Very difficult Very difficult Very difficult Not difficult at all     Anhedonia: no Weight changes: no Insomnia: yes hard to fall asleep  Hypersomnia: no Fatigue/loss of energy:  yes Feelings of worthlessness: yes Feelings of guilt: yes Impaired concentration/indecisiveness: yes Suicidal ideations: no  Crying spells: yes Recent Stressors/Life Changes: yes   Relationship problems: no   Family stress: no     Financial stress: no    Job stress: no    Recent death/loss: no  BACK PAIN Duration: months Mechanism of injury: unknown Location: bilarteral low back Onset: gradual Severity: moderate Quality: aching and shooting Frequency: constant Radiation: down her leg Aggravating factors: movement, staying still too long Alleviating factors: nothing Status: worse Treatments attempted:  gabapentin, cymbalta, rest, ice, heat, APAP, ibuprofen, aleve, physical therapy, and HEP  Relief with NSAIDs?: mild Nighttime pain:  yes Paresthesias / decreased sensation:  yes Bowel / bladder incontinence:  no Fevers:  no Dysuria / urinary frequency:  no  Relevant past medical, surgical, family and social history reviewed and updated as indicated. Interim medical history since our last visit reviewed. Allergies and medications reviewed and updated.  Review of Systems  Constitutional: Negative.   Respiratory: Negative.    Cardiovascular: Negative.   Gastrointestinal: Negative.   Musculoskeletal:  Positive for back pain and myalgias. Negative for arthralgias, gait problem, joint swelling, neck pain and neck stiffness.  Skin: Negative.   Neurological:  Positive for weakness and numbness. Negative for dizziness, tremors, seizures, syncope, facial asymmetry, speech difficulty, light-headedness and headaches.  Psychiatric/Behavioral: Negative.      Per HPI unless specifically indicated above     Objective:    BP 128/89   Pulse 84   Temp 98.1 F (36.7 C)   Wt 166 lb (75.3 kg)   SpO2 98%   BMI 30.36 kg/m   Wt Readings from Last 3 Encounters:  02/25/22 166 lb (75.3 kg)  02/04/22 163 lb 12.8 oz (74.3 kg)  01/28/22 166 lb 14.4 oz (75.7 kg)    Physical Exam Vitals  and nursing note reviewed.  Constitutional:      General: She is not in acute distress.    Appearance: Normal appearance. She is normal weight. She is not ill-appearing, toxic-appearing or diaphoretic.  HENT:     Head: Normocephalic and atraumatic.     Right Ear: External ear normal.     Left Ear: External ear normal.     Nose: Nose normal.     Mouth/Throat:     Mouth: Mucous membranes are moist.     Pharynx: Oropharynx is clear.  Eyes:     General: No scleral icterus.       Right eye: No discharge.        Left eye: No discharge.     Extraocular Movements: Extraocular movements intact.     Conjunctiva/sclera: Conjunctivae normal.     Pupils: Pupils are equal, round, and reactive to light.  Cardiovascular:     Rate and Rhythm: Normal rate and regular rhythm.     Pulses: Normal pulses.     Heart sounds: Normal heart sounds. No murmur heard.    No friction rub. No gallop.  Pulmonary:     Effort: Pulmonary effort is normal. No respiratory distress.     Breath sounds: Normal breath sounds. No stridor. No wheezing, rhonchi or rales.  Chest:     Chest wall: No tenderness.  Musculoskeletal:        General: Normal range of motion.     Cervical back: Normal range of motion and neck supple.  Skin:    General: Skin is warm and dry.     Capillary Refill: Capillary refill takes less than 2 seconds.     Coloration: Skin is not jaundiced or pale.     Findings: No bruising, erythema, lesion or rash.  Neurological:     General: No focal deficit present.     Mental Status: She is alert and oriented to person, place, and time. Mental status is at baseline.  Psychiatric:        Mood and Affect: Mood normal.        Behavior: Behavior normal.        Thought Content: Thought content normal.        Judgment: Judgment normal.     Results for orders placed or performed in visit on 11/02/21  CULTURE, URINE COMPREHENSIVE   Specimen: Urine   UR  Result Value Ref Range   Urine Culture,  Comprehensive Final report    Organism ID, Bacteria Comment   Microscopic Examination   Urine  Result Value Ref Range   WBC, UA 0-5 0 - 5 /hpf  RBC, Urine 3-10 (A) 0 - 2 /hpf   Epithelial Cells (non renal) 0-10 0 - 10 /hpf   Bacteria, UA None seen None seen/Few  Urinalysis, Complete  Result Value Ref Range   Specific Gravity, UA 1.010 1.005 - 1.030   pH, UA 6.0 5.0 - 7.5   Color, UA Straw Yellow   Appearance Ur Clear Clear   Leukocytes,UA Negative Negative   Protein,UA Negative Negative/Trace   Glucose, UA Negative Negative   Ketones, UA Negative Negative   RBC, UA 1+ (A) Negative   Bilirubin, UA Negative Negative   Urobilinogen, Ur 0.2 0.2 - 1.0 mg/dL   Nitrite, UA Negative Negative   Microscopic Examination See below:       Assessment & Plan:   Problem List Items Addressed This Visit       Nervous and Auditory   Osteoarthritis of spine with radiculopathy, lumbar region - Primary   Relevant Medications   pregabalin (LYRICA) 75 MG capsule     Musculoskeletal and Integument   Osteoarthritis of lumbar spine (Chronic)    Awaiting 2nd opinion with neurosurgery. Dr. Marcell Barlow recommends spinal cord stimulator which she would like to hold off on if possible. Could not tolerate cymbalta. Will change to lyrica and recheck 4-6 weeks. Call with any concerns.         Other   Anxiety disorder    Did not tolerate cymbalta. Will stop it. Call with any concerns.       Other Visit Diagnoses     Need for influenza vaccination       Relevant Orders   Flu Vaccine QUAD 6+ mos PF IM (Fluarix Quad PF)        Follow up plan: Return 4-6 weeks.

## 2022-02-26 ENCOUNTER — Telehealth: Payer: Self-pay | Admitting: Family Medicine

## 2022-02-26 NOTE — Telephone Encounter (Signed)
[name] from [facility] is calling returning Lindsey Drake's message. CB- [phone]

## 2022-03-01 NOTE — Telephone Encounter (Signed)
Attempted to contact Kidron at [facility], NA LVM

## 2022-03-02 ENCOUNTER — Telehealth: Payer: Self-pay

## 2022-03-02 NOTE — Telephone Encounter (Signed)
PA initiated via CoverMyMeds for Pregabalin 75MG  capsules  Key: [id number] Waiting on determination.

## 2022-03-03 NOTE — Telephone Encounter (Signed)
PA for Pregablin 75MG  capsule denied by CoverMyMeds Possible alternate drug: gabapentin immediate-release   Will await determination fax and place in provider folder when received.

## 2022-03-04 NOTE — Telephone Encounter (Signed)
I think she had a reaction to gabapentin- can we check?

## 2022-03-05 NOTE — Telephone Encounter (Signed)
Patient states she took Gabapentin and was able to tolerate it, had no reaction but felt like it didn't help her pain.

## 2022-03-12 NOTE — Telephone Encounter (Signed)
Can we try again with the PA and say the gabapentin was not effective?

## 2022-03-18 NOTE — Telephone Encounter (Signed)
PA retried for Pregabalin 75MG   Key: [id number]

## 2022-03-22 MED ORDER — GABAPENTIN 100 MG PO CAPS: 100.0000 mg | ORAL_CAPSULE | Freq: Three times a day (TID) | ORAL | 3 refills | Status: DC

## 2022-03-22 NOTE — Telephone Encounter (Signed)
Please let her know that her insurance requires her to try gabapentin first. Please have her let me know if she's having any side effects from it so we can retry the PA if needed.

## 2022-03-22 NOTE — Telephone Encounter (Signed)
PA retried and denied.

## 2022-03-23 DIAGNOSIS — M5432 Sciatica, left side: Secondary | ICD-10-CM | POA: Diagnosis not present

## 2022-03-23 DIAGNOSIS — M79605 Pain in left leg: Secondary | ICD-10-CM | POA: Diagnosis not present

## 2022-03-23 NOTE — Telephone Encounter (Signed)
Patient is aware, will pick up gabapentin RX

## 2022-04-08 ENCOUNTER — Ambulatory Visit (INDEPENDENT_AMBULATORY_CARE_PROVIDER_SITE_OTHER): Payer: 59 | Admitting: Family Medicine

## 2022-04-08 ENCOUNTER — Encounter: Payer: Self-pay | Admitting: Family Medicine

## 2022-04-08 VITALS — BP 129/84 | HR 84 | Temp 97.9°F | Wt 165.2 lb

## 2022-04-08 DIAGNOSIS — F339 Major depressive disorder, recurrent, unspecified: Secondary | ICD-10-CM | POA: Diagnosis not present

## 2022-04-08 DIAGNOSIS — M4726 Other spondylosis with radiculopathy, lumbar region: Secondary | ICD-10-CM | POA: Diagnosis not present

## 2022-04-08 DIAGNOSIS — R69 Illness, unspecified: Secondary | ICD-10-CM | POA: Diagnosis not present

## 2022-04-08 MED ORDER — PREGABALIN 75 MG PO CAPS: ORAL_CAPSULE | ORAL | 2 refills | Status: DC

## 2022-04-08 MED ORDER — DULOXETINE HCL 20 MG PO CPEP: 40.0000 mg | ORAL_CAPSULE | Freq: Every day | ORAL | 3 refills | Status: DC

## 2022-04-08 MED ORDER — HYDROCODONE-ACETAMINOPHEN 10-325 MG PO TABS: 1.0000 | ORAL_TABLET | Freq: Four times a day (QID) | ORAL | 0 refills | Status: AC | PRN

## 2022-04-08 NOTE — Progress Notes (Signed)
BP 129/84   Pulse 84   Temp 97.9 F (36.6 C)   Wt 165 lb 3.2 oz (74.9 kg)   SpO2 97%   BMI 30.22 kg/m    Subjective:    Patient ID: Lindsey Drake, female    DOB: 20-Dec-1965, 56 y.o.   MRN: [id number]  HPI: Lindsey Drake is a 56 y.o. female  Chief Complaint  Patient presents with   Osteoarthritis of spine with radiculopathy, lumbar region    Patient here to follow up on back pain, states gabapentin and cymbalta are not helping.    BACK PAIN- neurosurgery recommends stimulator, which she doesn't want to do- has a 2nd opinion in 2 weeks.  Duration:  chronic Mechanism of injury: no trauma Location: Left and low back Onset: gradual Severity: moderate Quality: aching and shooting Frequency: constant Radiation: down her leg Aggravating factors: movement and staying still too long Alleviating factors: nothing Status: worse Treatments attempted:  gabapentin, cymbalta, rest, ice, heat, APAP, ibuprofen, aleve, physical therapy, and HEP   Relief with NSAIDs?: no Nighttime pain:  yes Paresthesias / decreased sensation:  yes Bowel / bladder incontinence:  no Fevers:  no Dysuria / urinary frequency:  no  DEPRESSION Mood status: uncontrolled Satisfied with current treatment?: no Symptom severity: moderate  Duration of current treatment : months Side effects: no Medication compliance: excellent compliance Psychotherapy/counseling: no  Previous psychiatric medications: cymbalta Depressed mood: yes Anxious mood: yes Anhedonia: yes Significant weight loss or gain: no Insomnia: no  Fatigue: yes Feelings of worthlessness or guilt: yes Impaired concentration/indecisiveness: yes Suicidal ideations: no Hopelessness: yes Crying spells: yes    04/08/2022    3:21 PM 02/25/2022    3:38 PM 01/28/2022    3:36 PM 09/14/2021    3:21 PM 08/18/2021    2:58 PM  Depression screen PHQ 2/9  Decreased Interest 3 3 3 3 3   Down, Depressed, Hopeless 3 3 3 1 2   PHQ - 2 Score 6 6 6 4 5    Altered sleeping 3 3 3 3 3   Tired, decreased energy 3 3 3 2 3   Change in appetite 3 3 3 3 3   Feeling bad or failure about yourself  0 0 0 0 0  Trouble concentrating 1 0 2 0 0  Moving slowly or fidgety/restless 3 0 2 0 0  Suicidal thoughts 0 0 0 0 0  PHQ-9 Score 19 15 19 12 14   Difficult doing work/chores Extremely dIfficult Very difficult Very difficult        04/08/2022    3:21 PM 02/25/2022    3:38 PM 01/28/2022    3:36 PM 09/14/2021    3:22 PM  GAD 7 : Generalized Anxiety Score  Nervous, Anxious, on Edge 3 1 3 3   Control/stop worrying 3 2 3 3   Worry too much - different things 3 2 3 3   Trouble relaxing 3 2 3 3   Restless 3 3 2 2   Easily annoyed or irritable 3 3 3 3   Afraid - awful might happen 0 0 0 0  Total GAD 7 Score 18 13 17 17   Anxiety Difficulty Extremely difficult Very difficult Very difficult Very difficult     Relevant past medical, surgical, family and social history reviewed and updated as indicated. Interim medical history since our last visit reviewed. Allergies and medications reviewed and updated.  Review of Systems  Constitutional: Negative.   Respiratory: Negative.    Cardiovascular: Negative.   Gastrointestinal: Negative.   Musculoskeletal:  Positive  for back pain and myalgias. Negative for arthralgias, gait problem, joint swelling, neck pain and neck stiffness.  Neurological:  Positive for numbness. Negative for dizziness, tremors, seizures, syncope, facial asymmetry, speech difficulty, weakness, light-headedness and headaches.  Hematological: Negative.   Psychiatric/Behavioral: Negative.      Per HPI unless specifically indicated above     Objective:    BP 129/84   Pulse 84   Temp 97.9 F (36.6 C)   Wt 165 lb 3.2 oz (74.9 kg)   SpO2 97%   BMI 30.22 kg/m   Wt Readings from Last 3 Encounters:  04/08/22 165 lb 3.2 oz (74.9 kg)  02/25/22 166 lb (75.3 kg)  02/04/22 163 lb 12.8 oz (74.3 kg)    Physical Exam Vitals and nursing note reviewed.   Constitutional:      General: She is not in acute distress.    Appearance: Normal appearance. She is not ill-appearing, toxic-appearing or diaphoretic.  HENT:     Head: Normocephalic and atraumatic.     Right Ear: External ear normal.     Left Ear: External ear normal.     Nose: Nose normal.     Mouth/Throat:     Mouth: Mucous membranes are moist.     Pharynx: Oropharynx is clear.  Eyes:     General: No scleral icterus.       Right eye: No discharge.        Left eye: No discharge.     Extraocular Movements: Extraocular movements intact.     Conjunctiva/sclera: Conjunctivae normal.     Pupils: Pupils are equal, round, and reactive to light.  Cardiovascular:     Rate and Rhythm: Normal rate and regular rhythm.     Pulses: Normal pulses.     Heart sounds: Normal heart sounds. No murmur heard.    No friction rub. No gallop.  Pulmonary:     Effort: Pulmonary effort is normal. No respiratory distress.     Breath sounds: Normal breath sounds. No stridor. No wheezing, rhonchi or rales.  Chest:     Chest wall: No tenderness.  Musculoskeletal:        General: Normal range of motion.     Cervical back: Normal range of motion and neck supple.  Skin:    General: Skin is warm and dry.     Capillary Refill: Capillary refill takes less than 2 seconds.     Coloration: Skin is not jaundiced or pale.     Findings: No bruising, erythema, lesion or rash.  Neurological:     General: No focal deficit present.     Mental Status: She is alert and oriented to person, place, and time. Mental status is at baseline.  Psychiatric:        Mood and Affect: Mood normal.        Behavior: Behavior normal.        Thought Content: Thought content normal.        Judgment: Judgment normal.     Results for orders placed or performed in visit on 11/02/21  CULTURE, URINE COMPREHENSIVE   Specimen: Urine   UR  Result Value Ref Range   Urine Culture, Comprehensive Final report    Organism ID, Bacteria  Comment   Microscopic Examination   Urine  Result Value Ref Range   WBC, UA 0-5 0 - 5 /hpf   RBC, Urine 3-10 (A) 0 - 2 /hpf   Epithelial Cells (non renal) 0-10 0 - 10 /hpf  Bacteria, UA None seen None seen/Few  Urinalysis, Complete  Result Value Ref Range   Specific Gravity, UA 1.010 1.005 - 1.030   pH, UA 6.0 5.0 - 7.5   Color, UA Straw Yellow   Appearance Ur Clear Clear   Leukocytes,UA Negative Negative   Protein,UA Negative Negative/Trace   Glucose, UA Negative Negative   Ketones, UA Negative Negative   RBC, UA 1+ (A) Negative   Bilirubin, UA Negative Negative   Urobilinogen, Ur 0.2 0.2 - 1.0 mg/dL   Nitrite, UA Negative Negative   Microscopic Examination See below:       Assessment & Plan:   Problem List Items Addressed This Visit       Nervous and Auditory   Osteoarthritis of spine with radiculopathy, lumbar region - Primary    Did not tolerate the gabapentin. It made her very dizzy. Will see if we can get lyrica covered. Continue cymbalta. Call with any concerns. Has 2nd opinion appt with neurosurgery in 2 weeks. Follow up 1 month.       Relevant Medications   DULoxetine (CYMBALTA) 20 MG capsule   pregabalin (LYRICA) 75 MG capsule   HYDROcodone-acetaminophen (NORCO) 10-325 MG tablet     Other   Depression, recurrent (HCC)    Will increase her cymbalta to 40mg  and recheck 1 month. Call with any concerns. Continue to monitor closely.      Relevant Medications   DULoxetine (CYMBALTA) 20 MG capsule     Follow up plan: Return in about 4 weeks (around 05/06/2022).

## 2022-04-08 NOTE — Assessment & Plan Note (Signed)
Will increase her cymbalta to 40mg  and recheck 1 month. Call with any concerns. Continue to monitor closely.

## 2022-04-08 NOTE — Assessment & Plan Note (Signed)
Did not tolerate the gabapentin. It made her very dizzy. Will see if we can get lyrica covered. Continue cymbalta. Call with any concerns. Has 2nd opinion appt with neurosurgery in 2 weeks. Follow up 1 month.

## 2022-04-19 DIAGNOSIS — M5416 Radiculopathy, lumbar region: Secondary | ICD-10-CM | POA: Diagnosis not present

## 2022-05-06 ENCOUNTER — Other Ambulatory Visit: Payer: Self-pay | Admitting: Family Medicine

## 2022-05-06 NOTE — Telephone Encounter (Signed)
Requested medication (s) are due for refill today - no  Requested medication (s) are on the active medication list -no  Future visit scheduled -yes  Last refill: 04/08/22- no longer on current medication list  Notes to clinic: non delegated Rx  Requested Prescriptions  Pending Prescriptions Disp Refills   HYDROcodone-acetaminophen (NORCO) 10-325 MG tablet 20 tablet 0    Sig: Take 1 tablet by mouth every 6 (six) hours as needed for up to 5 days.     Not Delegated - Analgesics:  Opioid Agonist Combinations Failed - 05/06/2022 11:13 AM      Failed - This refill cannot be delegated      Failed - Urine Drug Screen completed in last 360 days      Passed - Valid encounter within last 3 months    Recent Outpatient Visits           4 weeks ago Osteoarthritis of spine with radiculopathy, lumbar region   [facility], Megan P, DO   2 months ago Osteoarthritis of spine with radiculopathy, lumbar region   [facility], Megan P, DO   3 months ago Osteoarthritis of spine with radiculopathy, lumbar region   [facility], [place], DO   7 months ago Dysuria   [facility] [place], Megan P, DO   8 months ago Acute cystitis with hematuria   [facility] [place], Megan P, DO       Future Appointments             In 4 days Laural Benes, Oralia Rud, DO [facility]               Requested Prescriptions  Pending Prescriptions Disp Refills   HYDROcodone-acetaminophen (NORCO) 10-325 MG tablet 20 tablet 0    Sig: Take 1 tablet by mouth every 6 (six) hours as needed for up to 5 days.     Not Delegated - Analgesics:  Opioid Agonist Combinations Failed - 05/06/2022 11:13 AM      Failed - This refill cannot be delegated      Failed - Urine Drug Screen completed in last 360 days      Passed - Valid encounter within last 3 months    Recent Outpatient Visits           4 weeks ago  Osteoarthritis of spine with radiculopathy, lumbar region   [facility], Megan P, DO   2 months ago Osteoarthritis of spine with radiculopathy, lumbar region   [facility], Megan P, DO   3 months ago Osteoarthritis of spine with radiculopathy, lumbar region   [facility] [place], DO   7 months ago Dysuria   [facility], Megan P, DO   8 months ago Acute cystitis with hematuria   [facility] Dorcas Carrow, DO       Future Appointments             In 4 days Laural Benes, Oralia Rud, DO [facility]

## 2022-05-06 NOTE — Telephone Encounter (Signed)
Medication Refill - Medication: HYDROcodone-acetaminophen (NORCO) 10-325 MG tablet   Has the patient contacted their pharmacy? No.  Preferred Pharmacy (with phone number or street name):  [id number] - [place], [place] - [address] Phone: [phone]  Fax: [phone]     Has the patient been seen for an appointment in the last year OR does the patient have an upcoming appointment? Yes.    Agent: Please be advised that RX refills may take up to 3 business days. We ask that you follow-up with your pharmacy.

## 2022-05-10 ENCOUNTER — Ambulatory Visit: Payer: 59 | Admitting: Family Medicine

## 2022-05-11 DIAGNOSIS — M5416 Radiculopathy, lumbar region: Secondary | ICD-10-CM | POA: Diagnosis not present

## 2022-05-28 ENCOUNTER — Ambulatory Visit (INDEPENDENT_AMBULATORY_CARE_PROVIDER_SITE_OTHER): Payer: 59 | Admitting: Physician Assistant

## 2022-05-28 ENCOUNTER — Encounter: Payer: Self-pay | Admitting: Physician Assistant

## 2022-05-28 VITALS — BP 124/84 | HR 88 | Temp 98.1°F | Ht 62.0 in | Wt 160.8 lb

## 2022-05-28 DIAGNOSIS — R112 Nausea with vomiting, unspecified: Secondary | ICD-10-CM | POA: Diagnosis not present

## 2022-05-28 DIAGNOSIS — M47816 Spondylosis without myelopathy or radiculopathy, lumbar region: Secondary | ICD-10-CM

## 2022-05-28 DIAGNOSIS — G8929 Other chronic pain: Secondary | ICD-10-CM

## 2022-05-28 MED ORDER — ONDANSETRON 4 MG PO TBDP: 4.0000 mg | ORAL_TABLET | Freq: Three times a day (TID) | ORAL | 0 refills | Status: DC | PRN

## 2022-05-28 MED ORDER — HYDROCODONE-ACETAMINOPHEN 10-325 MG PO TABS: 1.0000 | ORAL_TABLET | Freq: Three times a day (TID) | ORAL | 0 refills | Status: DC | PRN

## 2022-05-28 NOTE — Progress Notes (Unsigned)
Acute Office Visit   Patient: Lindsey Drake   DOB: [birth year]   57 y.o. Female  MRN: [id number] Visit Date: 05/28/2022  Today's healthcare provider: Oswaldo Conroy Daden Mahany, PA-C  Introduced myself to the patient as a Secondary school teacher and provided education on APPs in clinical practice.    Chief Complaint  Patient presents with   Abdominal Pain    Patient says she starting having pain on Wednesday morning when she woke up. Patient says she has been having diarrhea and vomiting. Patient says the diarrhea has stopped, but says she has no appetite for the past 3 days.    Flank Pain   Emesis   Nausea   Subjective    Abdominal Pain Associated symptoms include diarrhea, nausea and vomiting. Pertinent negatives include no dysuria or fever.  Flank Pain Associated symptoms include abdominal pain. Pertinent negatives include no dysuria or fever.  Emesis  Associated symptoms include abdominal pain and diarrhea. Pertinent negatives include no chills or fever.   HPI     Abdominal Pain    Additional comments: Patient says she starting having pain on Wednesday morning when she woke up. Patient says she has been having diarrhea and vomiting. Patient says the diarrhea has stopped, but says she has no appetite for the past 3 days.       Last edited by Malen Gauze, CMA on 05/28/2022 10:05 AM.        Abdominal Pain, Nausea and vomiting  She reports nausea and vomiting as well as diarrhea since Wed AM Onset: sudden  Duration: started feeling fatigued Tuesday night then Wed AM she felt weak and started vomiting  Associated symptoms: vomiting, diarrhea, poor PO intake, nausea has reduced her appetite  Fever: no  She denies any new or unusual foods prior to feeling this way Interventions: Dramamine to help with nausea  PO intake: poor but able to tolerate liquids  Sick contacts: no  She reports rolling to right side makes her feel more nauseous and causes burping   Chronic back Pain She reports  persistent back pain  Reviewed notes from previous encounter in 04/08/22 She reports she is going to have a spinal cord stimulator placed next week and is struggling with pain at this time  PDMP reviewed today and no evidence of suspicious rx use or misuse at this time.  Medications: Outpatient Medications Prior to Visit  Medication Sig   acetaminophen (TYLENOL) 500 MG tablet Take 500 mg by mouth every 6 (six) hours as needed for moderate pain.   meloxicam (MOBIC) 15 MG tablet Take 1 tablet (15 mg total) by mouth daily.   methocarbamol (ROBAXIN) 500 MG tablet Take 1 tablet (500 mg total) by mouth 4 (four) times daily.   polyethylene glycol powder (GLYCOLAX/MIRALAX) 17 GM/SCOOP powder Take 17 g by mouth 2 (two) times daily as needed.   pregabalin (LYRICA) 75 MG capsule Take 1 pill at bedtime for 1 week, then 1 pill 2x a day for 1 week, then 1 pill in the AM and 2 pills in the PM for 1 week, then 2 pills BID until you see me   DULoxetine (CYMBALTA) 20 MG capsule Take 2 capsules (40 mg total) by mouth daily. (Patient not taking: Reported on 05/28/2022)   No facility-administered medications prior to visit.    Review of Systems  Constitutional:  Positive for fatigue. Negative for chills and fever.  Gastrointestinal:  Positive for abdominal distention, abdominal pain, diarrhea, nausea  and vomiting. Negative for blood in stool.  Genitourinary:  Positive for flank pain. Negative for dysuria.       Objective    BP 124/84   Pulse 88   Temp 98.1 F (36.7 C) (Oral)   Ht 5\' 2"  (1.575 m)   Wt 160 lb 12.8 oz (72.9 kg)   SpO2 98%   BMI 29.41 kg/m    Physical Exam Vitals reviewed.  Constitutional:      General: She is awake. She is not in acute distress.    Appearance: Normal appearance. She is well-developed and well-groomed. She is not ill-appearing.  HENT:     Head: Normocephalic and atraumatic.     Mouth/Throat:     Lips: Pink.     Mouth: Mucous membranes are moist.     Pharynx:  Oropharynx is clear. Uvula midline. No oropharyngeal exudate or posterior oropharyngeal erythema.  Eyes:     General: Lids are normal. Gaze aligned appropriately.     Extraocular Movements: Extraocular movements intact.     Conjunctiva/sclera: Conjunctivae normal.  Cardiovascular:     Rate and Rhythm: Normal rate and regular rhythm.     Pulses: Normal pulses.     Heart sounds: Normal heart sounds. No murmur heard.    No friction rub. No gallop.  Pulmonary:     Effort: Pulmonary effort is normal.     Breath sounds: Normal breath sounds. No decreased air movement. No decreased breath sounds, wheezing, rhonchi or rales.  Abdominal:     General: Abdomen is flat. Bowel sounds are normal.     Palpations: Abdomen is soft.     Tenderness: There is generalized abdominal tenderness.  Musculoskeletal:     Right lower leg: No edema.     Left lower leg: No edema.  Neurological:     General: No focal deficit present.     Mental Status: She is alert and oriented to person, place, and time.  Psychiatric:        Mood and Affect: Mood normal.        Behavior: Behavior normal. Behavior is cooperative.        Thought Content: Thought content normal.        Judgment: Judgment normal.       No results found for any visits on 05/28/22.  Assessment & Plan      No follow-ups on file.       Problem List Items Addressed This Visit       Musculoskeletal and Integument   Osteoarthritis of lumbar spine (Chronic)    Chronic, ongoing concern She reports persistent back pain and states she is scheduled for spinal cord stimulator placement next week She is requesting pain medication to assist with pain while waiting for surgery  PDMP reviewed, no evidence of worrisome controlled substance use patterns at this time  Will provide 5 day supply of Norco 10-325 mg PO TID PRN to assist with this Recommend continued follow up with neurosurgery for further management and PCP for medication monitoring        Relevant Medications   HYDROcodone-acetaminophen (NORCO) 10-325 MG tablet   Other Visit Diagnoses     Nausea and vomiting in adult    -  Primary Acute, new concern  Reports ongoing Nausea and vomiting with diarrhea since Wed AM that is not improving with OTC Dramamine Suspect likely gastroenteritis secondary to viral illness at this time Will provide Zofran 4 mg PO TID PRN for nausea and vomiting to assist  with PO intake Reviewed staying well hydrated and eating bland diet with gradual reintroduction of normal diet as tolerated Can use small amount of Imodium PRN to assist with diarrhea but discussed with her that it is typically better for illness like this to run course. Follow up as needed- reviewed ED and return precautions    Relevant Medications   ondansetron (ZOFRAN-ODT) 4 MG disintegrating tablet        No follow-ups on file.   I, Khrystian Schauf E Evvie Behrmann, PA-C, have reviewed all documentation for this visit. The documentation on 05/31/22 for the exam, diagnosis, procedures, and orders are all accurate and complete.   Talitha Givens, MHS, PA-C [facility]

## 2022-05-31 ENCOUNTER — Other Ambulatory Visit: Payer: Self-pay | Admitting: Family Medicine

## 2022-05-31 NOTE — Assessment & Plan Note (Signed)
Chronic, ongoing concern She reports persistent back pain and states she is scheduled for spinal cord stimulator placement next week She is requesting pain medication to assist with pain while waiting for surgery  PDMP reviewed, no evidence of worrisome controlled substance use patterns at this time  Will provide 5 day supply of Norco 10-325 mg PO TID PRN to assist with this Recommend continued follow up with neurosurgery for further management and PCP for medication monitoring

## 2022-05-31 NOTE — Telephone Encounter (Signed)
Requested medication (s) are due for refill today: yes  Requested medication (s) are on the active medication list: yes  Last refill:  01/28/22 #120 3 refills  Future visit scheduled: no  Notes to clinic:  not delegated per protocol. Do you want to refill Rx?     Requested Prescriptions  Pending Prescriptions Disp Refills   methocarbamol (ROBAXIN) 500 MG tablet [Pharmacy Med Name: METHOCARBAMOL 500 MG TABLET] 120 tablet 3    Sig: TAKE 1 TABLET BY MOUTH 4 TIMES DAILY.     Not Delegated - Analgesics:  Muscle Relaxants Failed - 05/31/2022  1:23 AM      Failed - This refill cannot be delegated      Passed - Valid encounter within last 6 months    Recent Outpatient Visits           3 days ago Nausea and vomiting in adult   [place], Erin E, PA-C   1 month ago Osteoarthritis of spine with radiculopathy, lumbar region   [facility], [place] P, DO   3 months ago Osteoarthritis of spine with radiculopathy, lumbar region   [facility] [place], [place] P, DO   4 months ago Osteoarthritis of spine with radiculopathy, lumbar region   [facility], [place], DO   8 months ago [place], [place], DO

## 2022-06-03 DIAGNOSIS — M5416 Radiculopathy, lumbar region: Secondary | ICD-10-CM | POA: Diagnosis not present

## 2022-06-03 DIAGNOSIS — M961 Postlaminectomy syndrome, not elsewhere classified: Secondary | ICD-10-CM | POA: Diagnosis not present

## 2022-06-15 ENCOUNTER — Encounter: Payer: Self-pay | Admitting: Family Medicine

## 2022-06-15 DIAGNOSIS — M47816 Spondylosis without myelopathy or radiculopathy, lumbar region: Secondary | ICD-10-CM

## 2022-06-20 MED ORDER — HYDROCODONE-ACETAMINOPHEN 10-325 MG PO TABS: 1.0000 | ORAL_TABLET | Freq: Three times a day (TID) | ORAL | 0 refills | Status: AC | PRN

## 2022-06-24 ENCOUNTER — Other Ambulatory Visit: Payer: Self-pay | Admitting: Family Medicine

## 2022-06-24 DIAGNOSIS — M47816 Spondylosis without myelopathy or radiculopathy, lumbar region: Secondary | ICD-10-CM

## 2022-07-23 DIAGNOSIS — M5386 Other specified dorsopathies, lumbar region: Secondary | ICD-10-CM | POA: Diagnosis not present

## 2022-07-23 DIAGNOSIS — M961 Postlaminectomy syndrome, not elsewhere classified: Secondary | ICD-10-CM | POA: Diagnosis not present

## 2022-08-09 ENCOUNTER — Other Ambulatory Visit: Payer: Self-pay | Admitting: Family Medicine

## 2022-08-10 NOTE — Telephone Encounter (Signed)
Requested medication (s) are due for refill today: yes  Requested medication (s) are on the active medication list: yes  Last refill:  01/28/22  Future visit scheduled: no  Notes to clinic:  Unable to refill per protocol due to failed labs, no updated results.      Requested Prescriptions  Pending Prescriptions Disp Refills   meloxicam (MOBIC) 15 MG tablet [Pharmacy Med Name: MELOXICAM 15 MG TABLET] 30 tablet 5    Sig: Take 1 tablet (15 mg total) by mouth daily.     Analgesics:  COX2 Inhibitors Failed - 08/09/2022  1:49 AM      Failed - Manual Review: Labs are only required if the patient has taken medication for more than 8 weeks.      Failed - HGB in normal range and within 360 days    Hemoglobin  Date Value Ref Range Status  07/06/2021 11.5 (L) 12.0 - 15.0 g/dL Final  05/28/2021 11.5 11.1 - 15.9 g/dL Final         Failed - Cr in normal range and within 360 days    Creatinine, Ser  Date Value Ref Range Status  05/28/2021 0.68 0.57 - 1.00 mg/dL Final         Failed - HCT in normal range and within 360 days    HCT  Date Value Ref Range Status  07/06/2021 36.8 36.0 - 46.0 % Final   Hematocrit  Date Value Ref Range Status  05/28/2021 34.6 34.0 - 46.6 % Final         Failed - AST in normal range and within 360 days    AST  Date Value Ref Range Status  05/28/2021 17 0 - 40 IU/L Final         Failed - ALT in normal range and within 360 days    ALT  Date Value Ref Range Status  05/28/2021 9 0 - 32 IU/L Final         Failed - eGFR is 30 or above and within 360 days    GFR calc Af Amer  Date Value Ref Range Status  01/24/2020 97 >59 mL/min/1.73 Final    Comment:    **[facility] currently reports eGFR in compliance with the current**   recommendations of the [employer]. [facility] will   update reporting as new guidelines are published from the NKF-ASN   Task force.    GFR calc non Af Amer  Date Value Ref Range Status  01/24/2020 84 >59 mL/min/1.73  Final   eGFR  Date Value Ref Range Status  05/28/2021 102 >59 mL/min/1.73 Final         Passed - Patient is not pregnant      Passed - Valid encounter within last 12 months    Recent Outpatient Visits           2 months ago Nausea and vomiting in adult   [place], Erin E, PA-C   4 months ago Osteoarthritis of spine with radiculopathy, lumbar region   [place] P, DO   5 months ago Osteoarthritis of spine with radiculopathy, lumbar region   [place] P, DO   6 months ago Osteoarthritis of spine with radiculopathy, lumbar region   [place], Megan P, DO   11 months ago [place], [place], DO

## 2022-09-15 DIAGNOSIS — Z9689 Presence of other specified functional implants: Secondary | ICD-10-CM | POA: Diagnosis not present

## 2022-09-15 DIAGNOSIS — M5432 Sciatica, left side: Secondary | ICD-10-CM | POA: Diagnosis not present

## 2022-09-15 DIAGNOSIS — M533 Sacrococcygeal disorders, not elsewhere classified: Secondary | ICD-10-CM | POA: Diagnosis not present

## 2022-10-04 DIAGNOSIS — M533 Sacrococcygeal disorders, not elsewhere classified: Secondary | ICD-10-CM | POA: Diagnosis not present

## 2022-10-25 DIAGNOSIS — M47816 Spondylosis without myelopathy or radiculopathy, lumbar region: Secondary | ICD-10-CM | POA: Diagnosis not present

## 2022-10-25 DIAGNOSIS — M5416 Radiculopathy, lumbar region: Secondary | ICD-10-CM | POA: Diagnosis not present

## 2022-10-25 DIAGNOSIS — Z9689 Presence of other specified functional implants: Secondary | ICD-10-CM | POA: Diagnosis not present

## 2022-11-09 DIAGNOSIS — M47816 Spondylosis without myelopathy or radiculopathy, lumbar region: Secondary | ICD-10-CM | POA: Diagnosis not present

## 2022-11-12 ENCOUNTER — Other Ambulatory Visit: Payer: Self-pay | Admitting: Family Medicine

## 2022-11-12 NOTE — Telephone Encounter (Signed)
Requested medications are due for refill today.  unsure  Requested medications are on the active medications list.  yes  Last refill. 04/08/2022 #60 3 rf  Future visit scheduled.   no  Notes to clinic.  Labs are expired. On med list it states that pt is no longer taking  as of 05/28/2022.    Requested Prescriptions  Pending Prescriptions Disp Refills   DULoxetine (CYMBALTA) 20 MG capsule [Pharmacy Med Name: DULOXETINE HCL DR 20 MG CAP] 60 capsule 3    Sig: TAKE 2 CAPSULES BY MOUTH EVERY DAY     Psychiatry: Antidepressants - SNRI - duloxetine Failed - 11/12/2022  2:07 PM      Failed - Cr in normal range and within 360 days    Creatinine, Ser  Date Value Ref Range Status  05/28/2021 0.68 0.57 - 1.00 mg/dL Final         Failed - eGFR is 30 or above and within 360 days    GFR calc Af Amer  Date Value Ref Range Status  01/24/2020 97 >59 mL/min/1.73 Final    Comment:    **[facility] currently reports eGFR in compliance with the current**   recommendations of the [employer]. [facility] will   update reporting as new guidelines are published from the NKF-ASN   Task force.    GFR calc non Af Amer  Date Value Ref Range Status  01/24/2020 84 >59 mL/min/1.73 Final   eGFR  Date Value Ref Range Status  05/28/2021 102 >59 mL/min/1.73 Final         Passed - Completed PHQ-2 or PHQ-9 in the last 360 days      Passed - Last BP in normal range    BP Readings from Last 1 Encounters:  05/28/22 124/84         Passed - Valid encounter within last 6 months    Recent Outpatient Visits           5 months ago Nausea and vomiting in adult   [place] [facility] [place], Oswaldo Conroy, PA-C   7 months ago Osteoarthritis of spine with radiculopathy, lumbar region   [facility] [place], Megan P, DO   8 months ago Osteoarthritis of spine with radiculopathy, lumbar region   [facility] [place], Megan P, DO   9 months ago  Osteoarthritis of spine with radiculopathy, lumbar region   [facility] [place], Megan P, DO   1 year ago Dysuria   [place] [place], [place], DO

## 2022-11-21 DIAGNOSIS — Z6830 Body mass index (BMI) 30.0-30.9, adult: Secondary | ICD-10-CM | POA: Diagnosis not present

## 2022-11-21 DIAGNOSIS — R509 Fever, unspecified: Secondary | ICD-10-CM | POA: Diagnosis not present

## 2022-11-21 DIAGNOSIS — Z20822 Contact with and (suspected) exposure to covid-19: Secondary | ICD-10-CM | POA: Diagnosis not present

## 2022-11-21 DIAGNOSIS — J029 Acute pharyngitis, unspecified: Secondary | ICD-10-CM | POA: Diagnosis not present

## 2022-11-21 DIAGNOSIS — J069 Acute upper respiratory infection, unspecified: Secondary | ICD-10-CM | POA: Diagnosis not present

## 2022-11-21 DIAGNOSIS — Z1152 Encounter for screening for COVID-19: Secondary | ICD-10-CM | POA: Diagnosis not present

## 2022-11-30 DIAGNOSIS — M47816 Spondylosis without myelopathy or radiculopathy, lumbar region: Secondary | ICD-10-CM | POA: Diagnosis not present

## 2022-12-27 ENCOUNTER — Other Ambulatory Visit: Payer: Self-pay | Admitting: Family Medicine

## 2022-12-28 NOTE — Telephone Encounter (Signed)
Requested medication (s) are due for refill today: yes  Requested medication (s) are on the active medication list: yes    Last refill: 06/04/22   #120  3 refills  Future visit scheduled yes  01/17/23  Notes to clinic:Not delegated, please review. Thank you.  Requested Prescriptions  Pending Prescriptions Disp Refills   methocarbamol (ROBAXIN) 500 MG tablet [Pharmacy Med Name: METHOCARBAMOL 500 MG TABLET] 120 tablet 3    Sig: TAKE 1 TABLET BY MOUTH FOUR TIMES A DAY     Not Delegated - Analgesics:  Muscle Relaxants Failed - 12/27/2022  2:35 AM      Failed - This refill cannot be delegated      Failed - Valid encounter within last 6 months    Recent Outpatient Visits           7 months ago Nausea and vomiting in adult   [place] [facility] [place]ec[place], Erin E, PA-C   8 months ago Osteoarthritis of spine with radiculopathy, lumbar region   [place] [facility] [place], Megan P, DO   10 months ago Osteoarthritis of spine with radiculopathy, lumbar region   [facility] [place], Megan P, DO   11 months ago Osteoarthritis of spine with radiculopathy, lumbar region   [facility] [place], [place], DO   1 year ago Dysuria   [place] [facility] Dorcas Carrow, DO       Future Appointments             In 2 weeks Laural Benes, Oralia Rud, DO [place] [facility]

## 2023-01-03 DIAGNOSIS — Z6830 Body mass index (BMI) 30.0-30.9, adult: Secondary | ICD-10-CM | POA: Diagnosis not present

## 2023-01-03 DIAGNOSIS — M47816 Spondylosis without myelopathy or radiculopathy, lumbar region: Secondary | ICD-10-CM | POA: Diagnosis not present

## 2023-01-13 ENCOUNTER — Telehealth: Payer: Self-pay | Admitting: Obstetrics and Gynecology

## 2023-01-13 NOTE — Telephone Encounter (Signed)
This patient is requesting to be seen by Dr. Logan Bores. The patient report he has done surgery in the past  for her prolapse. They patient feels like it is back. The last visit on file is for 07/23/2020. I am unable to offer an appointment with Dr. Logan Bores at this time do to his September scheduled not being released. The patient would like to speak with a nurse. Please advise?

## 2023-01-14 ENCOUNTER — Telehealth: Payer: Self-pay | Admitting: Family Medicine

## 2023-01-14 NOTE — Telephone Encounter (Signed)
-----   Message from [facility] [place] R sent at 01/13/2023  2:24 PM EDT -----  ----- Message ----- From: Dorcas Carrow, DO Sent: 12/27/2022   3:54 PM EDT To: Pablo Ledger, CMA  Overdue for mammo- can we see if she wants one and get it scheduled before October if possible?

## 2023-01-14 NOTE — Telephone Encounter (Signed)
Called and left message on machine for pt to call the office and get assistance with scheduling her mammogram.

## 2023-01-17 ENCOUNTER — Ambulatory Visit: Payer: 59 | Admitting: Family Medicine

## 2023-01-17 NOTE — Telephone Encounter (Signed)
Spoke with patient, she is scheduled for 9/10.

## 2023-02-01 ENCOUNTER — Ambulatory Visit (INDEPENDENT_AMBULATORY_CARE_PROVIDER_SITE_OTHER): Payer: 59 | Admitting: Obstetrics and Gynecology

## 2023-02-01 ENCOUNTER — Other Ambulatory Visit: Payer: Self-pay | Admitting: Family Medicine

## 2023-02-01 ENCOUNTER — Encounter: Payer: Self-pay | Admitting: Obstetrics and Gynecology

## 2023-02-01 VITALS — BP 134/88 | HR 96 | Ht 62.0 in | Wt 166.7 lb

## 2023-02-01 DIAGNOSIS — N8189 Other female genital prolapse: Secondary | ICD-10-CM

## 2023-02-01 NOTE — Progress Notes (Signed)
Patient presents today to discuss a possible prolapse. She states over the last year noticing a bulge that has been getting larger. She has a history of an A&P repair in 2021.
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
## 2023-02-02 NOTE — Telephone Encounter (Signed)
Requested medication (s) are due for refill today: routing for review  Requested medication (s) are on the active medication list: yes  Last refill:  12/29/22  Future visit scheduled: no  Notes to clinic:  Unable to refill per protocol, cannot delegate.      Requested Prescriptions  Pending Prescriptions Disp Refills   methocarbamol (ROBAXIN) 500 MG tablet [Pharmacy Med Name: METHOCARBAMOL 500 MG TABLET] 120 tablet 0    Sig: TAKE 1 TABLET BY MOUTH FOUR TIMES A DAY     Not Delegated - Analgesics:  Muscle Relaxants Failed - 02/01/2023  1:35 AM      Failed - This refill cannot be delegated      Failed - Valid encounter within last 6 months    Recent Outpatient Visits           8 months ago Nausea and vomiting in adult   [facility] [place], Erin E, PA-C   10 months ago Osteoarthritis of spine with radiculopathy, lumbar region   [facility], Megan P, DO   11 months ago Osteoarthritis of spine with radiculopathy, lumbar region   [facility] [place], Megan P, DO   1 year ago Osteoarthritis of spine with radiculopathy, lumbar region   [facility] [place], Megan P, DO   1 year ago Dysuria   [place] [facility] [place], [place], DO

## 2023-02-22 DIAGNOSIS — Z9689 Presence of other specified functional implants: Secondary | ICD-10-CM | POA: Diagnosis not present

## 2023-02-22 DIAGNOSIS — M961 Postlaminectomy syndrome, not elsewhere classified: Secondary | ICD-10-CM | POA: Diagnosis not present

## 2023-02-22 DIAGNOSIS — Z6830 Body mass index (BMI) 30.0-30.9, adult: Secondary | ICD-10-CM | POA: Diagnosis not present

## 2023-02-22 DIAGNOSIS — M47816 Spondylosis without myelopathy or radiculopathy, lumbar region: Secondary | ICD-10-CM | POA: Diagnosis not present

## 2023-02-22 DIAGNOSIS — M5416 Radiculopathy, lumbar region: Secondary | ICD-10-CM | POA: Diagnosis not present

## 2023-02-24 ENCOUNTER — Other Ambulatory Visit: Payer: Self-pay | Admitting: Neurosurgery

## 2023-02-24 DIAGNOSIS — M5416 Radiculopathy, lumbar region: Secondary | ICD-10-CM

## 2023-03-01 ENCOUNTER — Encounter: Payer: Self-pay | Admitting: Neurosurgery

## 2023-03-21 NOTE — Discharge Instructions (Signed)

## 2023-03-22 ENCOUNTER — Encounter: Payer: 59 | Admitting: Family Medicine

## 2023-03-22 ENCOUNTER — Ambulatory Visit
Admission: RE | Admit: 2023-03-22 | Discharge: 2023-03-22 | Disposition: A | Discharge status: Home / Self Care | Payer: 59 | Source: Ambulatory Visit | Attending: Neurosurgery | Admitting: Neurosurgery

## 2023-03-22 DIAGNOSIS — M4727 Other spondylosis with radiculopathy, lumbosacral region: Secondary | ICD-10-CM | POA: Diagnosis not present

## 2023-03-22 DIAGNOSIS — M5416 Radiculopathy, lumbar region: Secondary | ICD-10-CM

## 2023-03-22 DIAGNOSIS — M5117 Intervertebral disc disorders with radiculopathy, lumbosacral region: Secondary | ICD-10-CM | POA: Diagnosis not present

## 2023-03-22 DIAGNOSIS — M4726 Other spondylosis with radiculopathy, lumbar region: Secondary | ICD-10-CM | POA: Diagnosis not present

## 2023-03-22 DIAGNOSIS — M5134 Other intervertebral disc degeneration, thoracic region: Secondary | ICD-10-CM | POA: Diagnosis not present

## 2023-03-22 DIAGNOSIS — M5124 Other intervertebral disc displacement, thoracic region: Secondary | ICD-10-CM | POA: Diagnosis not present

## 2023-03-22 DIAGNOSIS — M4728 Other spondylosis with radiculopathy, sacral and sacrococcygeal region: Secondary | ICD-10-CM | POA: Diagnosis not present

## 2023-03-22 DIAGNOSIS — M5116 Intervertebral disc disorders with radiculopathy, lumbar region: Secondary | ICD-10-CM | POA: Diagnosis not present

## 2023-03-22 DIAGNOSIS — M4317 Spondylolisthesis, lumbosacral region: Secondary | ICD-10-CM | POA: Diagnosis not present

## 2023-03-22 MED ORDER — MEPERIDINE HCL 50 MG/ML IJ SOLN
50.0000 mg | Freq: Once | INTRAMUSCULAR | Status: DC | PRN
Start: 2023-03-22 — End: 2023-03-23

## 2023-03-22 MED ORDER — DIAZEPAM 5 MG PO TABS
10.0000 mg | ORAL_TABLET | Freq: Once | ORAL | Status: AC
Start: 2023-03-22 — End: 2023-03-22
  Administered 2023-03-22: 10 mg via ORAL

## 2023-03-22 MED ORDER — IOPAMIDOL (ISOVUE-M 300) INJECTION 61%
10.0000 mL | Freq: Once | INTRAMUSCULAR | Status: AC
  Administered 2023-03-22: 10 mL via INTRATHECAL

## 2023-03-22 MED ORDER — ONDANSETRON HCL 4 MG/2ML IJ SOLN: 4.0000 mg | Freq: Once | INTRAMUSCULAR | Status: DC | PRN

## 2023-05-20 DIAGNOSIS — M961 Postlaminectomy syndrome, not elsewhere classified: Secondary | ICD-10-CM | POA: Diagnosis not present

## 2023-06-16 ENCOUNTER — Encounter: Payer: Self-pay | Admitting: Obstetrics and Gynecology

## 2023-06-17 NOTE — Telephone Encounter (Signed)
I contacted the patient via phone. She is scheduled for 2/25 with Dr Logan Bores

## 2023-07-19 ENCOUNTER — Encounter: Payer: Self-pay | Admitting: Obstetrics and Gynecology

## 2023-07-19 ENCOUNTER — Ambulatory Visit (INDEPENDENT_AMBULATORY_CARE_PROVIDER_SITE_OTHER): Payer: Medicaid Other | Admitting: Obstetrics and Gynecology

## 2023-07-19 VITALS — BP 138/106 | HR 116 | Ht 62.0 in | Wt 167.3 lb

## 2023-07-19 DIAGNOSIS — Z90710 Acquired absence of both cervix and uterus: Secondary | ICD-10-CM

## 2023-07-19 DIAGNOSIS — M47816 Spondylosis without myelopathy or radiculopathy, lumbar region: Secondary | ICD-10-CM | POA: Diagnosis not present

## 2023-07-19 DIAGNOSIS — N816 Rectocele: Secondary | ICD-10-CM

## 2023-07-19 DIAGNOSIS — N8189 Other female genital prolapse: Secondary | ICD-10-CM
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
## 2023-07-19 NOTE — Progress Notes (Signed)
 Patient presents today to discuss surgical options for rectocele. She states still experiencing discomfort with the bulge at time, denies any additional concern with urination.

## 2023-07-27 ENCOUNTER — Encounter: Payer: Self-pay | Admitting: Obstetrics and Gynecology

## 2023-08-04 ENCOUNTER — Encounter: Payer: 59 | Admitting: Obstetrics and Gynecology

## 2023-08-04 DIAGNOSIS — Z01818 Encounter for other preprocedural examination: Secondary | ICD-10-CM

## 2023-08-04 DIAGNOSIS — N816 Rectocele: Secondary | ICD-10-CM

## 2023-08-22 DIAGNOSIS — M47816 Spondylosis without myelopathy or radiculopathy, lumbar region: Secondary | ICD-10-CM | POA: Diagnosis not present

## 2023-08-22 DIAGNOSIS — M961 Postlaminectomy syndrome, not elsewhere classified: Secondary | ICD-10-CM | POA: Diagnosis not present

## 2023-08-22 DIAGNOSIS — F1190 Opioid use, unspecified, uncomplicated: Secondary | ICD-10-CM | POA: Diagnosis not present

## 2023-08-22 DIAGNOSIS — Z9689 Presence of other specified functional implants: Secondary | ICD-10-CM | POA: Diagnosis not present

## 2023-08-24 ENCOUNTER — Telehealth: Payer: Self-pay | Admitting: Obstetrics and Gynecology

## 2023-08-24 ENCOUNTER — Ambulatory Visit (INDEPENDENT_AMBULATORY_CARE_PROVIDER_SITE_OTHER): Admitting: Obstetrics and Gynecology

## 2023-08-24 ENCOUNTER — Encounter: Payer: Self-pay | Admitting: Obstetrics and Gynecology

## 2023-08-24 VITALS — BP 132/93 | HR 90 | Ht 62.0 in | Wt 167.7 lb

## 2023-08-24 DIAGNOSIS — N816 Rectocele: Secondary | ICD-10-CM | POA: Diagnosis not present

## 2023-08-24 DIAGNOSIS — N8189 Other female genital prolapse: Secondary | ICD-10-CM

## 2023-08-24 DIAGNOSIS — Z01818 Encounter for other preprocedural examination: Secondary | ICD-10-CM | POA: Diagnosis not present

## 2023-08-24 MED ORDER — ESTRADIOL 0.1 MG/GM VA CREA: 0.2500 | TOPICAL_CREAM | Freq: Every day | VAGINAL | 3 refills | Status: DC

## 2023-08-24 NOTE — Telephone Encounter (Signed)
 Reached out to pt to schedule post op appt with Dr. Logan Bores.  Left message for pt to call back to schedule.
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
## 2023-08-24 NOTE — Progress Notes (Signed)
 Patient presents today for a pre-op exam prior to posterior repair for rectocele. She states no additional concerns today.
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
## 2023-08-25 NOTE — Telephone Encounter (Signed)
 Pt has been scheduled with Dr. Logan Bores on 09/21/2023 at 2:35 for post op.

## 2023-09-05 ENCOUNTER — Other Ambulatory Visit: Payer: Self-pay

## 2023-09-05 ENCOUNTER — Encounter
Admission: RE | Admit: 2023-09-05 | Discharge: 2023-09-05 | Disposition: A | Discharge status: Home / Self Care | Source: Ambulatory Visit | Attending: Obstetrics and Gynecology | Admitting: Obstetrics and Gynecology

## 2023-09-05 DIAGNOSIS — I10 Essential (primary) hypertension: Secondary | ICD-10-CM

## 2023-09-05 DIAGNOSIS — D649 Anemia, unspecified: Secondary | ICD-10-CM

## 2023-09-05 DIAGNOSIS — Z01812 Encounter for preprocedural laboratory examination: Secondary | ICD-10-CM

## 2023-09-05 DIAGNOSIS — Z01810 Encounter for preprocedural cardiovascular examination: Secondary | ICD-10-CM

## 2023-09-05 HISTORY — DX: Spondylosis without myelopathy or radiculopathy, thoracic region: M47.814

## 2023-09-05 HISTORY — DX: Spondylosis without myelopathy or radiculopathy, lumbar region: M47.816

## 2023-09-05 HISTORY — DX: Mixed hyperlipidemia: E78.2

## 2023-09-05 HISTORY — DX: Rectocele: N81.6

## 2023-09-05 HISTORY — DX: Chronic pain syndrome: G89.4

## 2023-09-05 HISTORY — DX: Anemia, unspecified: D64.9

## 2023-09-05 HISTORY — DX: Other intervertebral disc degeneration, thoracic region: M51.34

## 2023-09-05 HISTORY — DX: Cystocele, unspecified: N81.10

## 2023-09-05 NOTE — Patient Instructions (Addendum)
 Your procedure is scheduled on:09-12-23 Monday Report to the Registration Desk on the 1st floor of the Medical Mall.Then proceed to the 2nd floor Surgery Desk To find out your arrival time, please call [phone] between 1PM - 3PM on:09-09-23 Friday If your arrival time is 6:00 am, do not arrive before that time as the Medical Mall entrance doors do not open until 6:00 am.  REMEMBER: Instructions that are not followed completely may result in serious medical risk, up to and including death; or upon the discretion of your surgeon and anesthesiologist your surgery may need to be rescheduled.  Do not eat food OR drink any liquids after midnight the night before surgery.  No gum chewing or hard candies.  One week prior to surgery:Stop NOW (09-05-23) Stop Anti-inflammatories (NSAIDS) such as Advil, Aleve, Ibuprofen, Motrin, Naproxen, Naprosyn and Aspirin based products such as Excedrin, Goody's Powder, BC Powder. Stop ANY OVER THE COUNTER supplements until after surgery.  You may however, continue to take Tylenol/Hydrocodone if needed for pain up until the day of surgery.  Continue taking all of your other prescription medications up until the day of surgery.  ON THE DAY OF SURGERY ONLY TAKE THESE MEDICATIONS WITH SIPS OF WATER: -HYDROcodone-acetaminophen (NORCO/VICODIN)  No Alcohol for 24 hours before or after surgery.  No Smoking including e-cigarettes for 24 hours before surgery.  No chewable tobacco products for at least 6 hours before surgery.  No nicotine patches on the day of surgery.  Do not use any "recreational" drugs for at least a week (preferably 2 weeks) before your surgery.  Please be advised that the combination of cocaine and anesthesia may have negative outcomes, up to and including death. If you test positive for cocaine, your surgery will be cancelled.  On the morning of surgery brush your teeth with toothpaste and water, you may rinse your mouth with mouthwash if you  wish. Do not swallow any toothpaste or mouthwash.  Do not wear jewelry, make-up, hairpins, clips or nail polish.  For welded (permanent) jewelry: bracelets, anklets, waist bands, etc.  Please have this removed prior to surgery.  If it is not removed, there is a chance that hospital personnel will need to cut it off on the day of surgery.  Do not wear lotions, powders, or perfumes.   Do not shave body hair from the neck down 48 hours before surgery.  Contact lenses, hearing aids and dentures may not be worn into surgery.  Do not bring valuables to the hospital. [facility] is not responsible for any missing/lost belongings or valuables.   Notify your doctor if there is any change in your medical condition (cold, fever, infection).  Wear comfortable clothing (specific to your surgery type) to the hospital.  After surgery, you can help prevent lung complications by doing breathing exercises.  Take deep breaths and cough every 1-2 hours. Your doctor may order a device called an Incentive Spirometer to help you take deep breaths. When coughing or sneezing, hold a pillow firmly against your incision with both hands. This is called "splinting." Doing this helps protect your incision. It also decreases belly discomfort.  If you are being admitted to the hospital overnight, leave your suitcase in the car. After surgery it may be brought to your room.  In case of increased patient census, it may be necessary for you, the patient, to continue your postoperative care in the Same Day Surgery department.  If you are being discharged the day of surgery, you will  not be allowed to drive home. You will need a responsible individual to drive you home and stay with you for 24 hours after surgery.   If you are taking public transportation, you will need to have a responsible individual with you.  Please call the Pre-admissions Testing Dept. at [phone] if you have any questions about these  instructions.  Surgery Visitation Policy:  Patients having surgery or a procedure may have two visitors.  Children under the age of 76 must have an adult with them who is not the patient.

## 2023-09-06 ENCOUNTER — Telehealth: Payer: Self-pay | Admitting: Urgent Care

## 2023-09-06 ENCOUNTER — Encounter
Admission: RE | Admit: 2023-09-06 | Discharge: 2023-09-06 | Disposition: A | Discharge status: Home / Self Care | Source: Ambulatory Visit | Attending: Obstetrics and Gynecology | Admitting: Obstetrics and Gynecology

## 2023-09-06 DIAGNOSIS — D649 Anemia, unspecified: Secondary | ICD-10-CM | POA: Diagnosis not present

## 2023-09-06 DIAGNOSIS — Z01818 Encounter for other preprocedural examination: Secondary | ICD-10-CM | POA: Diagnosis present

## 2023-09-06 DIAGNOSIS — I10 Essential (primary) hypertension: Secondary | ICD-10-CM | POA: Diagnosis not present

## 2023-09-06 DIAGNOSIS — Z01812 Encounter for preprocedural laboratory examination: Secondary | ICD-10-CM

## 2023-09-06 DIAGNOSIS — Z01810 Encounter for preprocedural cardiovascular examination: Secondary | ICD-10-CM

## 2023-09-06 DIAGNOSIS — E876 Hypokalemia: Secondary | ICD-10-CM | POA: Diagnosis not present

## 2023-09-06 LAB — BASIC METABOLIC PANEL WITH GFR
Anion gap: 9 (ref 5–15)
BUN: 12 mg/dL (ref 6–20)
CO2: 25 mmol/L (ref 22–32)
Calcium: 9.2 mg/dL (ref 8.9–10.3)
Chloride: 105 mmol/L (ref 98–111)
Creatinine, Ser: 0.76 mg/dL (ref 0.44–1.00)
GFR, Estimated: >60 mL/min (ref >60)
Glucose, Bld: 100 mg/dL — ABNORMAL HIGH (ref 70–99)
Potassium: 3 mmol/L — ABNORMAL LOW (ref 3.5–5.1)
Sodium: 139 mmol/L (ref 135–145)

## 2023-09-06 LAB — CBC
HCT: 38.1 % (ref 36.0–46.0)
Hemoglobin: 13.2 g/dL (ref 12.0–15.0)
MCH: 30.4 pg (ref 26.0–34.0)
MCHC: 34.6 g/dL (ref 30.0–36.0)
MCV: 87.8 fL (ref 80.0–100.0)
Platelets: 293 10*3/uL (ref 150–400)
RBC: 4.34 MIL/uL (ref 3.87–5.11)
RDW: 12.9 % (ref 11.5–15.5)
WBC: 8.9 10*3/uL (ref 4.0–10.5)
nRBC: 0 % (ref 0.0–0.2)

## 2023-09-06 LAB — TYPE AND SCREEN
ABO/RH(D): A POS
Antibody Screen: NEGATIVE

## 2023-09-06 LAB — MAGNESIUM: Magnesium: 2.2 mg/dL (ref 1.7–2.4)

## 2023-09-06 MED ORDER — POTASSIUM CHLORIDE CRYS ER 20 MEQ PO TBCR: 20.0000 meq | EXTENDED_RELEASE_TABLET | Freq: Every day | ORAL | 0 refills | Status: DC

## 2023-09-06 NOTE — Progress Notes (Signed)
 [facility] Perioperative Services: Pre-Admission/Anesthesia Testing  Abnormal Lab Notification and Treatment Plan of Care   Date: 09/06/23  Name: Lindsey Drake MRN:   [id number]  Re: Abnormal labs noted during PAT appointment   Notified:  Provider Name Provider Role Notification Mode  Billy Bue, MD OB/GYN (Surgeon) Routed and/or faxed via [place] [facility], Jerilee Montane, DO Primary Care Provider Routed and/or faxed via [facility]   Clinical Information and Notes:  ABNORMAL LAB VALUE(S): Lab Results  Component Value Date   K 3.0 (L) 09/06/2023   Lindsey Drake is scheduled for an elective COLPORRHAPHY, POSTERIOR, FOR RECTOCELE REPAIR; POSSIBLE ANTERIOR REPAIR; PERINEOPLASTY on 09/12/2023. In review of her medication reconciliation, it is noted that the patient is not taking any prescribed diuretic medications. Patient does have a history of hypokalemia in the past.   Please note, in efforts to promote a safe and effective anesthetic course, per current guidelines/standards set by the [facility] anesthesia team, the minimal acceptable K+ level for the patient to proceed with general anesthesia is 3.0 mmol/L. With that being said, if the patient drops any lower, her elective procedure will need to be postponed until K+ is better optimized. In efforts to prevent case cancellation, and ultimately to promote the safety of this patient undergoing sedation/anesthesia, will make efforts to optimize pre-surgical K+ level allowing the surgical intervention to proceed as planned.    Impression and Plan:  Lindsey Drake found to be HYPOkalemic at 3.0 mmol/L on preoperative labs.   Mg level was added and found to be normal at: 2.2 mg/dL  She is not on diuretic therapy.  Renal function normal (BUN 12 mg/dL and creatinine 9.56 mg/dL).  Patient denies regular use of laxative medications. In the setting of normal Mg level and in the absence of GI related symptoms (no diarrhea), dicussed that noted  derangement likely nutritional in nature. Reviewed other potentials for insensible losses. Reviewed plans for preoperative optimization as follows:   Meds ordered this encounter  Medications   potassium chloride SA (KLOR-CON M) 20 MEQ tablet    Sig: Take 1 tablet (20 mEq total) by mouth daily for 7 days. Take dose on morning of surgery.    Dispense:  7 tablet    Refill:  0    Please contact the patient as soon as it is available for pickup. Rx is for preoperative K+ optimization and needs to be started ASAP.   Encouraged patient to follow up with PCP about 2-3 weeks postoperatively to have labs rechecked to ensure that levels are remaining within normal range. Discussed nutritional intake of K+ rich foods as an adjunctive way to keep her K+ levels normal; list of K+ rich foods provided. Also mentioned ORS, however advised her not to rely solely on these drinks, as they are high in Na+, and she has a HTN diagnosis.   Will send copy of this note to surgeon and PCP to make them aware of K+ level and plans for correction. Discussed that PCP may elect to add a daily K+ supplement if levels remain low on recheck. Order entered to recheck K+ on the day of her surgery to ensure optimization. Wished patient the best of luck with her upcoming surgery and subsequent recovery. She was encouraged to return call to the PAT clinic, or to her surgeon's office, should any questions or concerns arise between now and the time of her surgery.  Encounter Diagnoses  Name Primary?   Pre-operative laboratory examination Yes  Hypokalemia    Lindsey Caroline, MSN, APRN, FNP-C, CEN [facility]  Perioperative Services Nurse Practitioner Phone: [phone] 09/06/23 4:44 PM  NOTE: This note has been prepared using Dragon dictation software. Despite my best ability to proofread, there is always the potential that unintentional transcriptional errors may still occur from this process.

## 2023-09-12 ENCOUNTER — Encounter: Payer: Self-pay | Admitting: Obstetrics and Gynecology

## 2023-09-12 ENCOUNTER — Other Ambulatory Visit: Payer: Self-pay

## 2023-09-12 ENCOUNTER — Ambulatory Visit: Payer: Self-pay | Admitting: Urgent Care

## 2023-09-12 ENCOUNTER — Ambulatory Visit
Admission: RE | Admit: 2023-09-12 | Discharge: 2023-09-12 | Disposition: A | Discharge status: Home / Self Care | Attending: Obstetrics and Gynecology | Admitting: Obstetrics and Gynecology

## 2023-09-12 ENCOUNTER — Encounter
Admission: RE | Disposition: A | Discharge status: Home / Self Care | Payer: Self-pay | Source: Home / Self Care | Attending: Obstetrics and Gynecology

## 2023-09-12 ENCOUNTER — Ambulatory Visit: Admitting: Registered Nurse

## 2023-09-12 DIAGNOSIS — N814 Uterovaginal prolapse, unspecified: Secondary | ICD-10-CM

## 2023-09-12 DIAGNOSIS — E876 Hypokalemia: Secondary | ICD-10-CM

## 2023-09-12 DIAGNOSIS — K219 Gastro-esophageal reflux disease without esophagitis: Secondary | ICD-10-CM | POA: Insufficient documentation

## 2023-09-12 DIAGNOSIS — I10 Essential (primary) hypertension: Secondary | ICD-10-CM | POA: Diagnosis not present

## 2023-09-12 DIAGNOSIS — N8189 Other female genital prolapse: Secondary | ICD-10-CM | POA: Insufficient documentation

## 2023-09-12 DIAGNOSIS — Z01812 Encounter for preprocedural laboratory examination: Secondary | ICD-10-CM

## 2023-09-12 DIAGNOSIS — Z90710 Acquired absence of both cervix and uterus: Secondary | ICD-10-CM | POA: Insufficient documentation

## 2023-09-12 DIAGNOSIS — F419 Anxiety disorder, unspecified: Secondary | ICD-10-CM | POA: Diagnosis not present

## 2023-09-12 DIAGNOSIS — N816 Rectocele: Secondary | ICD-10-CM | POA: Diagnosis present

## 2023-09-12 DIAGNOSIS — Z79899 Other long term (current) drug therapy: Secondary | ICD-10-CM | POA: Insufficient documentation

## 2023-09-12 DIAGNOSIS — F32A Depression, unspecified: Secondary | ICD-10-CM | POA: Insufficient documentation

## 2023-09-12 HISTORY — PX: PERINEOPLASTY: SHX2218

## 2023-09-12 HISTORY — PX: RECTOCELE REPAIR: SHX761

## 2023-09-12 LAB — POCT I-STAT, CHEM 8
BUN: 9 mg/dL (ref 6–20)
Calcium, Ion: 1.18 mmol/L (ref 1.15–1.40)
Chloride: 107 mmol/L (ref 98–111)
Creatinine, Ser: 0.8 mg/dL (ref 0.44–1.00)
Glucose, Bld: 102 mg/dL — ABNORMAL HIGH (ref 70–99)
HCT: 42 % (ref 36.0–46.0)
Hemoglobin: 14.3 g/dL (ref 12.0–15.0)
Potassium: 3.8 mmol/L (ref 3.5–5.1)
Sodium: 142 mmol/L (ref 135–145)
TCO2: 23 mmol/L (ref 22–32)

## 2023-09-12 SURGERY — COLPORRHAPHY, POSTERIOR, FOR RECTOCELE REPAIR
Anesthesia: General | Site: Vagina

## 2023-09-12 MED ORDER — 0.9 % SODIUM CHLORIDE (POUR BTL) OPTIME
TOPICAL | Status: DC | PRN
  Administered 2023-09-12: 500 mL

## 2023-09-12 MED ORDER — PROPOFOL 500 MG/50ML IV EMUL
INTRAVENOUS | Status: DC | PRN
  Administered 2023-09-12: 175 ug/kg/min via INTRAVENOUS

## 2023-09-12 MED ORDER — VASOPRESSIN 20 UNIT/ML IV SOLN
INTRAVENOUS | Status: DC | PRN
  Administered 2023-09-12: 8 mL via INTRAMUSCULAR

## 2023-09-12 MED ORDER — SODIUM CHLORIDE (PF) 0.9 % IJ SOLN
INTRAMUSCULAR | Status: AC
  Filled 2023-09-12: qty 50

## 2023-09-12 MED ORDER — FENTANYL CITRATE (PF) 100 MCG/2ML IJ SOLN
25.0000 ug | INTRAMUSCULAR | Status: DC | PRN
  Administered 2023-09-12 (×3): 50 ug via INTRAVENOUS

## 2023-09-12 MED ORDER — DEXAMETHASONE SODIUM PHOSPHATE 10 MG/ML IJ SOLN
INTRAMUSCULAR | Status: AC
  Filled 2023-09-12: qty 1

## 2023-09-12 MED ORDER — LIDOCAINE HCL (CARDIAC) PF 100 MG/5ML IV SOSY
PREFILLED_SYRINGE | INTRAVENOUS | Status: DC | PRN
  Administered 2023-09-12: 100 mg via INTRAVENOUS

## 2023-09-12 MED ORDER — FENTANYL CITRATE (PF) 100 MCG/2ML IJ SOLN
INTRAMUSCULAR | Status: AC
  Filled 2023-09-12: qty 2

## 2023-09-12 MED ORDER — GABAPENTIN 300 MG PO CAPS
300.0000 mg | ORAL_CAPSULE | ORAL | Status: AC
Start: 2023-09-12 — End: 2023-09-12
  Administered 2023-09-12: 300 mg via ORAL

## 2023-09-12 MED ORDER — DEXMEDETOMIDINE HCL IN NACL 80 MCG/20ML IV SOLN
INTRAVENOUS | Status: DC | PRN
  Administered 2023-09-12: 8 ug via INTRAVENOUS
  Administered 2023-09-12: 4 ug via INTRAVENOUS

## 2023-09-12 MED ORDER — CELECOXIB 200 MG PO CAPS
400.0000 mg | ORAL_CAPSULE | ORAL | Status: AC
  Administered 2023-09-12: 400 mg via ORAL

## 2023-09-12 MED ORDER — ACETAMINOPHEN 10 MG/ML IV SOLN
INTRAVENOUS | Status: DC | PRN
  Administered 2023-09-12: 1000 mg via INTRAVENOUS

## 2023-09-12 MED ORDER — CHLORHEXIDINE GLUCONATE 0.12 % MT SOLN
OROMUCOSAL | Status: AC
  Filled 2023-09-12: qty 15

## 2023-09-12 MED ORDER — LACTATED RINGERS IV SOLN: INTRAVENOUS | Status: DC

## 2023-09-12 MED ORDER — GLYCOPYRROLATE 0.2 MG/ML IJ SOLN
INTRAMUSCULAR | Status: DC | PRN
  Administered 2023-09-12: .2 mg via INTRAVENOUS

## 2023-09-12 MED ORDER — CHLORHEXIDINE GLUCONATE 0.12 % MT SOLN
15.0000 mL | Freq: Once | OROMUCOSAL | Status: AC
  Administered 2023-09-12: 15 mL via OROMUCOSAL

## 2023-09-12 MED ORDER — PROPOFOL 10 MG/ML IV BOLUS
INTRAVENOUS | Status: DC | PRN
Start: 2023-09-12 — End: 2023-09-12
  Administered 2023-09-12: 150 mg via INTRAVENOUS

## 2023-09-12 MED ORDER — ESTROGENS CONJUGATED 0.625 MG/GM VA CREA
TOPICAL_CREAM | VAGINAL | Status: AC
  Filled 2023-09-12: qty 30

## 2023-09-12 MED ORDER — ONDANSETRON HCL 4 MG/2ML IJ SOLN
INTRAMUSCULAR | Status: DC | PRN
  Administered 2023-09-12: 4 mg via INTRAVENOUS

## 2023-09-12 MED ORDER — GABAPENTIN 300 MG PO CAPS
ORAL_CAPSULE | ORAL | Status: AC
  Filled 2023-09-12: qty 1

## 2023-09-12 MED ORDER — ACETAMINOPHEN 10 MG/ML IV SOLN
INTRAVENOUS | Status: AC
  Filled 2023-09-12: qty 100

## 2023-09-12 MED ORDER — OXYCODONE HCL 5 MG PO TABS
ORAL_TABLET | ORAL | Status: AC
  Filled 2023-09-12: qty 1

## 2023-09-12 MED ORDER — CELECOXIB 200 MG PO CAPS
ORAL_CAPSULE | ORAL | Status: AC
  Filled 2023-09-12: qty 2

## 2023-09-12 MED ORDER — OXYCODONE HCL 5 MG PO TABS
5.0000 mg | ORAL_TABLET | Freq: Once | ORAL | Status: AC
  Administered 2023-09-12: 5 mg via ORAL

## 2023-09-12 MED ORDER — HYDROCODONE-ACETAMINOPHEN 5-325 MG PO TABS: 1.0000 | ORAL_TABLET | Freq: Four times a day (QID) | ORAL | 0 refills | Status: DC | PRN

## 2023-09-12 MED ORDER — PHENYLEPHRINE 80 MCG/ML (10ML) SYRINGE FOR IV PUSH (FOR BLOOD PRESSURE SUPPORT)
PREFILLED_SYRINGE | INTRAVENOUS | Status: DC | PRN
  Administered 2023-09-12 (×5): 160 ug via INTRAVENOUS

## 2023-09-12 MED ORDER — PROPOFOL 1000 MG/100ML IV EMUL
INTRAVENOUS | Status: AC
  Filled 2023-09-12: qty 200

## 2023-09-12 MED ORDER — GLYCOPYRROLATE 0.2 MG/ML IJ SOLN
INTRAMUSCULAR | Status: AC
  Filled 2023-09-12: qty 1

## 2023-09-12 MED ORDER — MIDAZOLAM HCL 2 MG/2ML IJ SOLN
INTRAMUSCULAR | Status: DC | PRN
  Administered 2023-09-12: 2 mg via INTRAVENOUS

## 2023-09-12 MED ORDER — LIDOCAINE HCL (PF) 2 % IJ SOLN
INTRAMUSCULAR | Status: AC
  Filled 2023-09-12: qty 5

## 2023-09-12 MED ORDER — ONDANSETRON HCL 4 MG/2ML IJ SOLN
INTRAMUSCULAR | Status: AC
  Filled 2023-09-12: qty 2

## 2023-09-12 MED ORDER — FENTANYL CITRATE (PF) 100 MCG/2ML IJ SOLN
INTRAMUSCULAR | Status: DC | PRN
  Administered 2023-09-12 (×4): 25 ug via INTRAVENOUS

## 2023-09-12 MED ORDER — DROPERIDOL 2.5 MG/ML IJ SOLN: 0.6250 mg | Freq: Once | INTRAMUSCULAR | Status: DC | PRN

## 2023-09-12 MED ORDER — ORAL CARE MOUTH RINSE: 15.0000 mL | Freq: Once | OROMUCOSAL | Status: AC

## 2023-09-12 MED ORDER — POVIDONE-IODINE 10 % EX SWAB
2.0000 | Freq: Once | CUTANEOUS | Status: AC
  Administered 2023-09-12: 2 via TOPICAL

## 2023-09-12 MED ORDER — MIDAZOLAM HCL 2 MG/2ML IJ SOLN
INTRAMUSCULAR | Status: AC
  Filled 2023-09-12: qty 2

## 2023-09-12 MED ORDER — VASOPRESSIN 20 UNIT/ML IV SOLN
INTRAVENOUS | Status: AC
  Filled 2023-09-12: qty 1

## 2023-09-12 MED ORDER — DEXAMETHASONE SODIUM PHOSPHATE 10 MG/ML IJ SOLN
INTRAMUSCULAR | Status: DC | PRN
  Administered 2023-09-12: 10 mg via INTRAVENOUS

## 2023-09-12 SURGICAL SUPPLY — 35 items
ADHESIVE MASTISOL STRL (MISCELLANEOUS) IMPLANT
BAG DECANTER FOR FLEXI CONT (MISCELLANEOUS) ×1 IMPLANT
BAG URINE DRAIN UROCATCH STRL (MISCELLANEOUS) ×1 IMPLANT
BLADE SURG SZ10 CARB STEEL (BLADE) ×1 IMPLANT
CATH FOLEY 2WAY SIL 16X30 (CATHETERS) ×1 IMPLANT
CLEANER CAUTERY TIP PAD (MISCELLANEOUS) IMPLANT
DRAPE PERI LITHO V/GYN (MISCELLANEOUS) ×1 IMPLANT
DRAPE UNDER BUTTOCK W/FLU (DRAPES) IMPLANT
DRAPE UTILITY 15X26 TOWEL STRL (DRAPES) ×1 IMPLANT
ELECTRODE REM PT RTRN 9FT ADLT (ELECTROSURGICAL) ×1 IMPLANT
GAUZE 4X4 16PLY ~~LOC~~+RFID DBL (SPONGE) ×4 IMPLANT
GLOVE PI ORTHO PRO STRL 7.5 (GLOVE) ×2 IMPLANT
GOWN STRL REUS W/ TWL LRG LVL3 (GOWN DISPOSABLE) ×2 IMPLANT
GOWN STRL REUS W/ TWL XL LVL3 (GOWN DISPOSABLE) ×1 IMPLANT
MANIFOLD NEPTUNE II (INSTRUMENTS) ×1 IMPLANT
NDL HYPO 22X1.5 SAFETY MO (MISCELLANEOUS) ×1 IMPLANT
NDL SPNL 22GX3.5 QUINCKE BK (NEEDLE) IMPLANT
NEEDLE HYPO 22X1.5 SAFETY MO (MISCELLANEOUS) ×1 IMPLANT
NEEDLE SPNL 22GX3.5 QUINCKE BK (NEEDLE) ×1 IMPLANT
NS IRRIG 500ML POUR BTL (IV SOLUTION) IMPLANT
PACK BASIN MINOR ARMC (MISCELLANEOUS) ×1 IMPLANT
PAD OB MATERNITY 11 LF (PERSONAL CARE ITEMS) ×1 IMPLANT
RETRACTOR PHONTONGUIDE ADAPT (ADAPTER) IMPLANT
SCRUB CHG 4% DYNA-HEX 4OZ (MISCELLANEOUS) ×1 IMPLANT
STRIP CLOSURE SKIN 1/2X4 (GAUZE/BANDAGES/DRESSINGS) ×1 IMPLANT
SURGILUBE 2OZ TUBE FLIPTOP (MISCELLANEOUS) ×1 IMPLANT
SUT VIC AB 0 CT1 27XCR 8 STRN (SUTURE) ×2 IMPLANT
SUT VIC AB 0 CT1 36 (SUTURE) ×1 IMPLANT
SUT VICRYL 0 UR6 27IN ABS (SUTURE) ×2 IMPLANT
SYR 10ML LL (SYRINGE) ×2 IMPLANT
SYR CONTROL 10ML LL (SYRINGE) IMPLANT
TAPE TRANSPORE STRL 2 31045 (GAUZE/BANDAGES/DRESSINGS) IMPLANT
TOWEL OR 17X26 4PK STRL BLUE (TOWEL DISPOSABLE) ×1 IMPLANT
TRAP FLUID SMOKE EVACUATOR (MISCELLANEOUS) ×1 IMPLANT
WATER STERILE IRR 500ML POUR (IV SOLUTION) ×1 IMPLANT
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
## 2023-09-12 NOTE — Op Note (Signed)
      OPERATIVE NOTE 09/12/2023 12:16 PM  PRE-OPERATIVE DIAGNOSIS:  1) Rectocele, cystocele  POST-OPERATIVE DIAGNOSIS:  Post-Op Diagnosis Codes:    * Rectocele [N81.6]    * Other female genital prolapse [N81.89]  OPERATION: Procedure(s) (LRB): COLPORRHAPHY, POSTERIOR, FOR RECTOCELE REPAIR AND ANTERIOR REPAIR (N/A) PERINEOPLASTY (N/A)  SURGEON(S): Surgeons and Role:    Zenobia Hila, MD - Primary   ANESTHESIA: General  ESTIMATED BLOOD LOSS: 25 mL  SPECIMEN: * No specimens in log *  COMPLICATIONS: None  DRAINS: Foley to gravity  DISPOSITION: Stable to recovery room  PROCEDURE: The vaginal mucosa beginning at the vaginal cuff and overlying the bladder, was grasped with Allis clamps and injected with a dilute Pitressin solution in the midline. A midline incision was made to the level of the urethra. The vaginal mucosa was dissected laterally from the underlying attenuated fascia. A Foley catheter was placed within the bladder and the bladder was emptied. Clear urine was noted.The bladder was plicated several sutures of 3-0 Vicryl.  A shelf of fascia was then approximated in the midline placing the bladder back in its more anatomic position.The excess vaginal mucosa was trimmed. Vaginal mucosa was then closed in the midline with interrupted sutures to the level of the vaginal cuff. The vaginal cuff was closed with Vicryl suture. Hemostasis was noted. The posterior fourchette at approximately the hymenal ring was grasped using Allis clamps. The posterior vaginal mucosa was injected in the midline with a dilute Pitressin solution. A midline incision was made through the vaginal mucosa to the level of the vaginal cuff, and the vaginal mucosa was dissected laterally exposing the underlying attenuated fascia. Beginning at the vaginal cuff the attenuated fascia was grasped laterally and approximated in the midline thickening and tightening the fascia. These sutures were carried down to  the level of the perineum. The excess vaginal mucosa was trimmed. A perineoplasty was performed by reapproximating the underlying bulbocavernosus muscles. The vagina was closed with interrupted sutures beginning at the vaginal cuff and carried down toward the perineal body. The perineal body was reinforced with multiple sutures of Vicryl. The mucosa was then closed over the perineal body in a subcuticular manner. Hemostasis was noted.    Delice Felt, M.D. 09/12/2023 12:16 PM

## 2023-09-12 NOTE — Interval H&P Note (Signed)
 History and Physical Interval Note:  09/12/2023 10:37 AM  [name]  has presented today for surgery, with the diagnosis of Rectocele, cystocele.  The various methods of treatment have been discussed with the patient and family. After consideration of risks, benefits and other options for treatment, the patient has consented to  Procedure(s) with comments: COLPORRHAPHY, POSTERIOR, FOR RECTOCELE REPAIR (N/A) - Possible anterior repair PERINEOPLASTY (N/A) as a surgical intervention.  The patient's history has been reviewed, patient examined, no change in status, stable for surgery.  I have reviewed the patient's chart and labs.  Questions were answered to the patient's satisfaction.     Lindsey Drake

## 2023-09-12 NOTE — Anesthesia Procedure Notes (Signed)
 Procedure Name: LMA Insertion Date/Time: 09/12/2023 10:59 AM  Performed by: Racheal Buddle, CRNAPre-anesthesia Checklist: Patient identified, Patient being monitored, Timeout performed, Emergency Drugs available and Suction available Patient Re-evaluated:Patient Re-evaluated prior to induction Oxygen Delivery Method: Circle system utilized Preoxygenation: Pre-oxygenation with 100% oxygen Induction Type: IV induction Ventilation: Mask ventilation without difficulty LMA: LMA inserted LMA Size: 4.0 Tube type: Oral Number of attempts: 1 Placement Confirmation: positive ETCO2 and breath sounds checked- equal and bilateral Tube secured with: Tape Dental Injury: Teeth and Oropharynx as per pre-operative assessment

## 2023-09-12 NOTE — Transfer of Care (Signed)
 Immediate Anesthesia Transfer of Care Note  Patient: Lindsey Drake  Procedure(s) Performed: COLPORRHAPHY, POSTERIOR, FOR RECTOCELE REPAIR AND ANTERIOR REPAIR (Vagina ) PERINEOPLASTY (Vagina )  Patient Location: PACU  Anesthesia Type:General  Level of Consciousness: awake and alert   Airway & Oxygen Therapy: Patient Spontanous Breathing and Patient connected to face mask oxygen  Post-op Assessment: Report given to RN and Post -op Vital signs reviewed and stable  Post vital signs: stable  Last Vitals:  Vitals Value Taken Time  BP 123/89 09/12/23 1219  Temp 36.5 C 09/12/23 1219  Pulse 93 09/12/23 1223  Resp 9 09/12/23 1223  SpO2 100 % 09/12/23 1223  Vitals shown include unfiled device data.  Last Pain:  Vitals:   09/12/23 1219  TempSrc:   PainSc: 0-No pain         Complications: No notable events documented.

## 2023-09-13 ENCOUNTER — Encounter: Payer: Self-pay | Admitting: Obstetrics and Gynecology

## 2023-09-15 ENCOUNTER — Encounter: Payer: Self-pay | Admitting: Obstetrics and Gynecology

## 2023-09-16 ENCOUNTER — Other Ambulatory Visit: Payer: Self-pay | Admitting: Obstetrics and Gynecology

## 2023-09-16 NOTE — Telephone Encounter (Signed)
 Spoke with Dr. Luster Salters and then patient. Recommended Doculax for the next two days and if no relief or BM by Sunday patient has been instructed to try and at home enema. Advised if pain becomes worse and she does not feel comfortable with enema, she needs to go to ED.

## 2023-09-21 ENCOUNTER — Ambulatory Visit (INDEPENDENT_AMBULATORY_CARE_PROVIDER_SITE_OTHER): Admitting: Obstetrics and Gynecology

## 2023-09-21 ENCOUNTER — Encounter: Payer: Self-pay | Admitting: Obstetrics and Gynecology

## 2023-09-21 VITALS — BP 136/97 | HR 111 | Ht 61.0 in | Wt 165.9 lb

## 2023-09-21 DIAGNOSIS — Z9889 Other specified postprocedural states: Secondary | ICD-10-CM

## 2023-09-21 NOTE — Progress Notes (Signed)
 Patient presents for 1 week postop follow-up following A&P repair with perineoplasty. She states having rectal pain, she had previously taken multiple stool softeners and had diarrhea but that has resolved.
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
## 2023-10-01 NOTE — Anesthesia Postprocedure Evaluation (Signed)
 Anesthesia Post Note  Patient: Lindsey Drake  Procedure(s) Performed: COLPORRHAPHY, POSTERIOR, FOR RECTOCELE REPAIR AND ANTERIOR REPAIR (Vagina ) PERINEOPLASTY (Vagina )  Patient location during evaluation: PACU Anesthesia Type: General Level of consciousness: awake and alert Pain management: pain level controlled Vital Signs Assessment: post-procedure vital signs reviewed and stable Respiratory status: spontaneous breathing, nonlabored ventilation, respiratory function stable and patient connected to nasal cannula oxygen Cardiovascular status: blood pressure returned to baseline and stable Postop Assessment: no apparent nausea or vomiting Anesthetic complications: no   No notable events documented.   Last Vitals:  Vitals:   09/12/23 1329 09/12/23 1342  BP: 139/78 (!) 149/99  Pulse: 79 88  Resp: 12 16  Temp:  36.5 C  SpO2: 98% 97%    Last Pain:  Vitals:   09/12/23 1342  TempSrc: Temporal  PainSc: 8                  Vanice Genre

## 2023-10-26 ENCOUNTER — Encounter: Admitting: Obstetrics and Gynecology

## 2023-11-01 ENCOUNTER — Ambulatory Visit (INDEPENDENT_AMBULATORY_CARE_PROVIDER_SITE_OTHER): Admitting: Obstetrics and Gynecology

## 2023-11-01 ENCOUNTER — Encounter: Payer: Self-pay | Admitting: Obstetrics and Gynecology

## 2023-11-01 VITALS — BP 137/87 | HR 106 | Ht 61.0 in | Wt 166.0 lb

## 2023-11-01 DIAGNOSIS — Z9889 Other specified postprocedural states: Secondary | ICD-10-CM

## 2023-11-01 DIAGNOSIS — N898 Other specified noninflammatory disorders of vagina: Secondary | ICD-10-CM

## 2023-11-01 DIAGNOSIS — Z4889 Encounter for other specified surgical aftercare: Secondary | ICD-10-CM

## 2023-11-01 MED ORDER — ESTRADIOL 10 MCG VA TABS: 1.0000 | ORAL_TABLET | VAGINAL | 3 refills | Status: DC
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
## 2023-11-01 NOTE — Progress Notes (Signed)
 Patient presents for 6 week postop follow-up following A&P repair. She states doing much better, denies any pain or bleeding. She has been using estrogen vaginal cream nightly.

## 2023-11-08 DIAGNOSIS — F1190 Opioid use, unspecified, uncomplicated: Secondary | ICD-10-CM | POA: Diagnosis not present

## 2023-11-08 DIAGNOSIS — Z9689 Presence of other specified functional implants: Secondary | ICD-10-CM | POA: Diagnosis not present

## 2023-11-08 DIAGNOSIS — M961 Postlaminectomy syndrome, not elsewhere classified: Secondary | ICD-10-CM | POA: Diagnosis not present

## 2024-03-01 ENCOUNTER — Telehealth: Payer: Self-pay

## 2024-03-01 NOTE — Telephone Encounter (Signed)
 Ok for E2C2 to review.  Called to see if patient would be open to the mobile mammogram bus coming to our office on 03/16/2024 or 04/23/2024. If patient is open to either of these please, ask a preferred time and we will do our best to accommodate and let patient know when she is scheduled.   If she is not able to make either of these days but does want to proceed with scheduling her overdue mammogram please let patient know she will be contacted for scheduling and if she has a mychart directions will be sent as well for how to self schedule.

## 2024-03-29 ENCOUNTER — Ambulatory Visit (INDEPENDENT_AMBULATORY_CARE_PROVIDER_SITE_OTHER)

## 2024-03-29 DIAGNOSIS — Z23 Encounter for immunization: Secondary | ICD-10-CM

## 2024-03-29 NOTE — Progress Notes (Signed)
 Patient is in office today for a nurse visit for flu Immunization. Patient Injection was given in the  Left deltoid. Patient tolerated injection well.

## 2024-04-09 ENCOUNTER — Ambulatory Visit (INDEPENDENT_AMBULATORY_CARE_PROVIDER_SITE_OTHER): Admitting: Family Medicine

## 2024-04-09 ENCOUNTER — Encounter: Payer: Self-pay | Admitting: Family Medicine

## 2024-04-09 VITALS — BP 147/87 | HR 101 | Temp 98.0°F | Ht 61.0 in | Wt 165.6 lb

## 2024-04-09 DIAGNOSIS — Z1231 Encounter for screening mammogram for malignant neoplasm of breast: Secondary | ICD-10-CM | POA: Diagnosis not present

## 2024-04-09 DIAGNOSIS — F411 Generalized anxiety disorder: Secondary | ICD-10-CM

## 2024-04-09 DIAGNOSIS — Z789 Other specified health status: Secondary | ICD-10-CM | POA: Diagnosis not present

## 2024-04-09 DIAGNOSIS — F339 Major depressive disorder, recurrent, unspecified: Secondary | ICD-10-CM | POA: Diagnosis not present

## 2024-04-09 DIAGNOSIS — E782 Mixed hyperlipidemia: Secondary | ICD-10-CM

## 2024-04-09 DIAGNOSIS — I10 Essential (primary) hypertension: Secondary | ICD-10-CM | POA: Diagnosis not present

## 2024-04-09 LAB — MICROALBUMIN, URINE WAIVED
Creatinine, Urine Waived: 300 mg/dL (ref 10–300)
Microalb, Ur Waived: 80 mg/L — ABNORMAL HIGH (ref 0–19)

## 2024-04-09 MED ORDER — DULOXETINE HCL 20 MG PO CPEP: ORAL_CAPSULE | ORAL | 3 refills | Status: DC

## 2024-04-09 MED ORDER — LOSARTAN POTASSIUM 25 MG PO TABS: 25.0000 mg | ORAL_TABLET | Freq: Every day | ORAL | 2 refills | Status: DC

## 2024-04-09 NOTE — Progress Notes (Signed)
 BP (!) 147/87   Pulse (!) 101   Temp 98 F (36.7 C) (Oral)   Ht 5' 1 (1.549 m)   Wt 165 lb 9.6 oz (75.1 kg)   SpO2 94%   BMI 31.29 kg/m    Subjective:    Patient ID: Lindsey Drake, female    DOB: 05/17/1966, 58 y.o.   MRN: [id number]  HPI: Lindsey Drake is a 58 y.o. female who presents today after being lost to follow up for 2 years  Chief Complaint  Patient presents with   Anxiety   Depression   ANXIETY/DEPRESSION Duration: chronic, worse in the past few months Status:exacerbated Anxious mood: yes  Excessive worrying: yes Irritability: yes  Sweating: no Nausea: no Palpitations:no Hyperventilation: no Panic attacks: no Agoraphobia: no  Obscessions/compulsions: yes Depressed mood: yes Previous meds: lexapro , buspar , wellbutrin , sertraline     04/09/2024    2:38 PM 05/28/2022   10:07 AM 04/08/2022    3:21 PM 02/25/2022    3:38 PM 01/28/2022    3:36 PM  Depression screen PHQ 2/9  Decreased Interest 3 3 3 3 3   Down, Depressed, Hopeless 2 3 3 3 3   PHQ - 2 Score 5 6 6 6 6   Altered sleeping 3 3 3 3 3   Tired, decreased energy 3 3 3 3 3   Change in appetite 2 3 3 3 3   Feeling bad or failure about yourself  0 0 0 0 0  Trouble concentrating 3 2 1  0 2  Moving slowly or fidgety/restless 0 0 3 0 2  Suicidal thoughts 0 0 0 0 0  PHQ-9 Score 16 17  19  15  19    Difficult doing work/chores Somewhat difficult Extremely dIfficult Extremely dIfficult Very difficult Very difficult     Data saved with a previous flowsheet row definition      04/09/2024    2:42 PM 05/28/2022   10:07 AM 04/08/2022    3:21 PM 02/25/2022    3:38 PM  GAD 7 : Generalized Anxiety Score  Nervous, Anxious, on Edge 3 3 3 1   Control/stop worrying 3 3 3 2   Worry too much - different things 3 3 3 2   Trouble relaxing 3 3 3 2   Restless 0 3 3 3   Easily annoyed or irritable 3 3 3 3   Afraid - awful might happen 0 0 0 0  Total GAD 7 Score 15 18 18 13   Anxiety Difficulty Somewhat difficult Extremely  difficult Extremely difficult Very difficult   Anhedonia: no Weight changes: no Insomnia: no   Hypersomnia: no Fatigue/loss of energy: yes Feelings of worthlessness: yes Feelings of guilt: yes Impaired concentration/indecisiveness: no Suicidal ideations: no  Crying spells: yes Recent Stressors/Life Changes: yes   Relationship problems: no   Family stress: yes     Financial stress: no    Job stress: no    Recent death/loss: no  HYPERTENSION / HYPERLIPIDEMIA Satisfied with current treatment? no Duration of hypertension: chronic BP monitoring frequency: not checking BP medication side effects: no Past BP meds: metoprolol , lisinopril ,  Duration of hyperlipidemia: chronic Cholesterol medication side effects: no Cholesterol supplements: none Past cholesterol medications: atorvastatin  Medication compliance: poor compliance Aspirin : no Recent stressors: yes Recurrent headaches: no Visual changes: no Palpitations: no Dyspnea: no Chest pain: no Lower extremity edema: no Dizzy/lightheaded: no   Relevant past medical, surgical, family and social history reviewed and updated as indicated. Interim medical history since our last visit reviewed. Allergies and medications reviewed and updated.  Review of Systems  Constitutional: Negative.   Respiratory: Negative.    Cardiovascular: Negative.   Musculoskeletal: Negative.   Neurological: Negative.   Psychiatric/Behavioral:  Positive for dysphoric mood. Negative for agitation, behavioral problems, confusion, decreased concentration, hallucinations, self-injury, sleep disturbance and suicidal ideas. The patient is nervous/anxious. The patient is not hyperactive.     Per HPI unless specifically indicated above     Objective:    BP (!) 147/87   Pulse (!) 101   Temp 98 F (36.7 C) (Oral)   Ht 5' 1 (1.549 m)   Wt 165 lb 9.6 oz (75.1 kg)   SpO2 94%   BMI 31.29 kg/m   Wt Readings from Last 3 Encounters:  04/09/24 165 lb 9.6  oz (75.1 kg)  11/01/23 166 lb (75.3 kg)  09/21/23 165 lb 14.4 oz (75.3 kg)    Physical Exam Vitals and nursing note reviewed.  Constitutional:      General: She is not in acute distress.    Appearance: Normal appearance. She is not ill-appearing, toxic-appearing or diaphoretic.  HENT:     Head: Normocephalic and atraumatic.     Right Ear: External ear normal.     Left Ear: External ear normal.     Nose: Nose normal.     Mouth/Throat:     Mouth: Mucous membranes are moist.     Pharynx: Oropharynx is clear.  Eyes:     General: No scleral icterus.       Right eye: No discharge.        Left eye: No discharge.     Extraocular Movements: Extraocular movements intact.     Conjunctiva/sclera: Conjunctivae normal.     Pupils: Pupils are equal, round, and reactive to light.  Cardiovascular:     Rate and Rhythm: Normal rate and regular rhythm.     Pulses: Normal pulses.     Heart sounds: Normal heart sounds. No murmur heard.    No friction rub. No gallop.  Pulmonary:     Effort: Pulmonary effort is normal. No respiratory distress.     Breath sounds: Normal breath sounds. No stridor. No wheezing, rhonchi or rales.  Chest:     Chest wall: No tenderness.  Musculoskeletal:        General: Normal range of motion.     Cervical back: Normal range of motion and neck supple.  Skin:    General: Skin is warm and dry.     Capillary Refill: Capillary refill takes less than 2 seconds.     Coloration: Skin is not jaundiced or pale.     Findings: No bruising, erythema, lesion or rash.  Neurological:     General: No focal deficit present.     Mental Status: She is alert and oriented to person, place, and time. Mental status is at baseline.  Psychiatric:        Mood and Affect: Mood normal.        Behavior: Behavior normal.        Thought Content: Thought content normal.        Judgment: Judgment normal.     Results for orders placed or performed in visit on 04/09/24  Microalbumin, Urine  Waived   Collection Time: 04/09/24  3:06 PM  Result Value Ref Range   Microalb, Ur Waived 80 (H) 0 - 19 mg/L   Creatinine, Urine Waived 300 10 - 300 mg/dL   Microalb/Creat Ratio 30-300 (H) <30 mg/g      Assessment & Plan:  Problem List Items Addressed This Visit       Cardiovascular and Mediastinum   Essential hypertension, benign - Primary   Will restart her losartan  and recheck in about a month. Call with any concerns. Continue to monitor.       Relevant Medications   losartan  (COZAAR ) 25 MG tablet   Other Relevant Orders   Microalbumin, Urine Waived (Completed)   TSH (Completed)   Comprehensive metabolic panel with GFR (Completed)     Other   Anxiety disorder   Not doing well. Will restart her medicine and recheck in about a month. Call with any concerns.       Relevant Medications   DULoxetine  (CYMBALTA ) 20 MG capsule   Mixed hyperlipidemia   Rechecking labs today. Await results. Treat as needed.       Relevant Medications   losartan  (COZAAR ) 25 MG tablet   Other Relevant Orders   Lipid Panel w/o Chol/HDL Ratio (Completed)   Depression, recurrent   Not doing well. Will restart her medicine and recheck in about a month. Call with any concerns.       Relevant Medications   DULoxetine  (CYMBALTA ) 20 MG capsule   Other Relevant Orders   TSH (Completed)   CBC with Differential/Platelet (Completed)   Other Visit Diagnoses       Hepatitis B vaccination status unknown       Labs drawn today. Await results.   Relevant Orders   Hepatitis B surface antibody,quantitative (Completed)     Encounter for screening mammogram for malignant neoplasm of breast       Mammogram ordered today.   Relevant Orders   MM 3D SCREENING MAMMOGRAM BILATERAL BREAST        Follow up plan: Return in about 4 weeks (around 05/07/2024) for OK to use same day; please get lab visit for tomorrow.

## 2024-04-11 ENCOUNTER — Other Ambulatory Visit

## 2024-04-11 DIAGNOSIS — I10 Essential (primary) hypertension: Secondary | ICD-10-CM | POA: Diagnosis not present

## 2024-04-11 DIAGNOSIS — E782 Mixed hyperlipidemia: Secondary | ICD-10-CM

## 2024-04-11 DIAGNOSIS — Z789 Other specified health status: Secondary | ICD-10-CM | POA: Diagnosis not present

## 2024-04-11 DIAGNOSIS — F339 Major depressive disorder, recurrent, unspecified: Secondary | ICD-10-CM

## 2024-04-12 LAB — CBC WITH DIFFERENTIAL/PLATELET
Basophils Absolute: 0.1 x10E3/uL (ref 0.0–0.2)
Basos: 1 %
EOS (ABSOLUTE): 0.2 x10E3/uL (ref 0.0–0.4)
Eos: 3 %
Hematocrit: 43.9 % (ref 34.0–46.6)
Hemoglobin: 14.4 g/dL (ref 11.1–15.9)
Immature Grans (Abs): 0 x10E3/uL (ref 0.0–0.1)
Immature Granulocytes: 0 %
Lymphocytes Absolute: 3.8 x10E3/uL — ABNORMAL HIGH (ref 0.7–3.1)
Lymphs: 47 %
MCH: 29.7 pg (ref 26.6–33.0)
MCHC: 32.8 g/dL (ref 31.5–35.7)
MCV: 91 fL (ref 79–97)
Monocytes Absolute: 0.5 x10E3/uL (ref 0.1–0.9)
Monocytes: 6 %
Neutrophils Absolute: 3.4 x10E3/uL (ref 1.4–7.0)
Neutrophils: 43 %
Platelets: 318 x10E3/uL (ref 150–450)
RBC: 4.85 x10E6/uL (ref 3.77–5.28)
RDW: 13.3 % (ref 11.7–15.4)
WBC: 8 x10E3/uL (ref 3.4–10.8)

## 2024-04-12 LAB — COMPREHENSIVE METABOLIC PANEL WITH GFR
ALT: 19 IU/L (ref 0–32)
AST: 20 IU/L (ref 0–40)
Albumin: 4.5 g/dL (ref 3.8–4.9)
Alkaline Phosphatase: 72 IU/L (ref 49–135)
BUN/Creatinine Ratio: 21 (ref 9–23)
BUN: 18 mg/dL (ref 6–24)
Bilirubin Total: 0.2 mg/dL (ref 0.0–1.2)
CO2: 23 mmol/L (ref 20–29)
Calcium: 9.6 mg/dL (ref 8.7–10.2)
Chloride: 102 mmol/L (ref 96–106)
Creatinine, Ser: 0.84 mg/dL (ref 0.57–1.00)
Globulin, Total: 2.2 g/dL (ref 1.5–4.5)
Glucose: 92 mg/dL (ref 70–99)
Potassium: 4.1 mmol/L (ref 3.5–5.2)
Sodium: 144 mmol/L (ref 134–144)
Total Protein: 6.7 g/dL (ref 6.0–8.5)
eGFR: 80 mL/min/1.73 (ref >59)

## 2024-04-12 LAB — LIPID PANEL W/O CHOL/HDL RATIO
Cholesterol, Total: 357 mg/dL — ABNORMAL HIGH (ref 100–199)
HDL: 69 mg/dL (ref >39)
LDL Chol Calc (NIH): 250 mg/dL — ABNORMAL HIGH (ref 0–99)
Triglycerides: 192 mg/dL — ABNORMAL HIGH (ref 0–149)
VLDL Cholesterol Cal: 38 mg/dL (ref 5–40)

## 2024-04-12 LAB — HEPATITIS B SURFACE ANTIBODY, QUANTITATIVE: Hepatitis B Surf Ab Quant: <3.5 m[IU]/mL — ABNORMAL LOW

## 2024-04-12 LAB — TSH: TSH: 1.5 u[IU]/mL (ref 0.450–4.500)

## 2024-04-13 NOTE — Assessment & Plan Note (Signed)
 Not doing well. Will restart her medicine and recheck in about a month. Call with any concerns.

## 2024-04-13 NOTE — Assessment & Plan Note (Signed)
 Will restart her losartan  and recheck in about a month. Call with any concerns. Continue to monitor.

## 2024-04-13 NOTE — Assessment & Plan Note (Signed)
 Rechecking labs today. Await results. Treat as needed.

## 2024-04-17 ENCOUNTER — Ambulatory Visit: Payer: Self-pay | Admitting: Family Medicine

## 2024-04-17 MED ORDER — ROSUVASTATIN CALCIUM 10 MG PO TABS: 10.0000 mg | ORAL_TABLET | Freq: Every day | ORAL | 1 refills | Status: DC

## 2024-04-23 ENCOUNTER — Other Ambulatory Visit: Payer: Self-pay | Admitting: Family Medicine

## 2024-04-23 ENCOUNTER — Ambulatory Visit: Admission: RE | Admit: 2024-04-23 | Discharge: 2024-04-23 | Disposition: A | Source: Ambulatory Visit

## 2024-04-23 DIAGNOSIS — Z1231 Encounter for screening mammogram for malignant neoplasm of breast: Secondary | ICD-10-CM

## 2024-04-23 DIAGNOSIS — E782 Mixed hyperlipidemia: Secondary | ICD-10-CM

## 2024-04-23 DIAGNOSIS — F411 Generalized anxiety disorder: Secondary | ICD-10-CM

## 2024-04-23 DIAGNOSIS — I10 Essential (primary) hypertension: Secondary | ICD-10-CM

## 2024-04-23 DIAGNOSIS — F339 Major depressive disorder, recurrent, unspecified: Secondary | ICD-10-CM

## 2024-04-23 DIAGNOSIS — Z789 Other specified health status: Secondary | ICD-10-CM

## 2024-04-30 ENCOUNTER — Ambulatory Visit: Payer: Self-pay | Admitting: Family Medicine

## 2024-05-07 ENCOUNTER — Encounter: Payer: Self-pay | Admitting: Family Medicine

## 2024-05-07 ENCOUNTER — Ambulatory Visit: Admitting: Family Medicine

## 2024-05-07 VITALS — BP 142/100 | HR 93 | Temp 98.0°F | Ht 61.0 in

## 2024-05-07 DIAGNOSIS — F339 Major depressive disorder, recurrent, unspecified: Secondary | ICD-10-CM | POA: Diagnosis not present

## 2024-05-07 DIAGNOSIS — E782 Mixed hyperlipidemia: Secondary | ICD-10-CM

## 2024-05-07 DIAGNOSIS — I10 Essential (primary) hypertension: Secondary | ICD-10-CM

## 2024-05-07 DIAGNOSIS — F411 Generalized anxiety disorder: Secondary | ICD-10-CM | POA: Diagnosis not present

## 2024-05-07 DIAGNOSIS — Z789 Other specified health status: Secondary | ICD-10-CM

## 2024-05-07 MED ORDER — DULOXETINE HCL 30 MG PO CPEP: 30.0000 mg | ORAL_CAPSULE | Freq: Every day | ORAL | 2 refills | Status: AC

## 2024-05-07 MED ORDER — LOSARTAN POTASSIUM 50 MG PO TABS: 50.0000 mg | ORAL_TABLET | Freq: Every day | ORAL | 2 refills | Status: DC

## 2024-05-07 NOTE — Assessment & Plan Note (Signed)
 Improved, but still high. Will increase her losartan  to 50mg  and recheck in about 4-6 weeks. Call with any concerns.

## 2024-05-07 NOTE — Assessment & Plan Note (Signed)
 Will increase her cymbalta  to 30mg  and recheck in 4-6 weeks. Call with any concerns. Continue to monitor.

## 2024-05-07 NOTE — Progress Notes (Signed)
 BP (!) 142/100   Pulse 93   Temp 98 F (36.7 C) (Oral)   Ht 5' 1 (1.549 m)   SpO2 97%   BMI 31.29 kg/m    Subjective:    Patient ID: Lindsey Drake, female    DOB: October 22, 1965, 58 y.o.   MRN: [id number]  HPI: Lindsey Drake is a 58 y.o. female  Chief Complaint  Patient presents with   Hypertension   Depression   Anxiety   HYPERTENSION  Hypertension status: uncontrolled  Satisfied with current treatment? no Duration of hypertension: chronic BP monitoring frequency:  a few times a week BP range: 119/105 BP medication side effects:  no Medication compliance: excellent compliance Previous BP meds: losartan  Aspirin : no Recurrent headaches: no Visual changes: no Palpitations: no Dyspnea: no Chest pain: no Lower extremity edema: no Dizzy/lightheaded: no  HYPERLIPIDEMIA Hyperlipidemia status: excellent compliance Satisfied with current treatment?  yes Side effects:  no Medication compliance: excellent compliance Past cholesterol meds: atorvastatin  Supplements: none Aspirin :  no The ASCVD Risk score (Arnett DK, et al., 2019) failed to calculate for the following reasons:   According to ACC/AHA guidelines, patients with an LDL-C level greater than 190 mg/dL (5.08 mmol/L) should be considered for high-intensity statin therapy. This patient's most recent LDL-C level is 250 mg/dL. Chest pain:  no Coronary artery disease:  no  ANXIETY/DEPRESSION Duration: chronic Status:uncontrolled Anxious mood: yes  Excessive worrying: yes Irritability: yes  Sweating: no Nausea: no Palpitations:no Hyperventilation: no Panic attacks: no Agoraphobia: no  Obscessions/compulsions: no Depressed mood: yes    05/07/2024    2:55 PM 04/09/2024    2:38 PM 05/28/2022   10:07 AM 04/08/2022    3:21 PM 02/25/2022    3:38 PM  Depression screen PHQ 2/9  Decreased Interest 3 3 3 3 3   Down, Depressed, Hopeless 3 2 3 3 3   PHQ - 2 Score 6 5 6 6 6   Altered sleeping 3 3 3 3 3   Tired,  decreased energy 3 3 3 3 3   Change in appetite 0 2 3 3 3   Feeling bad or failure about yourself  0 0 0 0 0  Trouble concentrating 3 3 2 1  0  Moving slowly or fidgety/restless 0 0 0 3 0  Suicidal thoughts 0 0 0 0 0  PHQ-9 Score 15 16 17  19  15    Difficult doing work/chores Not difficult at all Somewhat difficult Extremely dIfficult Extremely dIfficult Very difficult     Data saved with a previous flowsheet row definition      05/07/2024    2:55 PM 04/09/2024    2:42 PM 05/28/2022   10:07 AM 04/08/2022    3:21 PM  GAD 7 : Generalized Anxiety Score  Nervous, Anxious, on Edge 3 3 3 3   Control/stop worrying 3 3 3 3   Worry too much - different things 3 3 3 3   Trouble relaxing 3 3 3 3   Restless 0 0 3 3  Easily annoyed or irritable 3 3 3 3   Afraid - awful might happen 0 0 0 0  Total GAD 7 Score 15 15 18 18   Anxiety Difficulty Not difficult at all Somewhat difficult Extremely difficult Extremely difficult   Anhedonia: no Weight changes: no Insomnia: no   Hypersomnia: no Fatigue/loss of energy: yes Feelings of worthlessness: no Feelings of guilt: no Impaired concentration/indecisiveness: no Suicidal ideations: no  Crying spells: no Recent Stressors/Life Changes: no   Relationship problems: no   Family stress: no  Financial stress: no    Job stress: no    Recent death/loss: no  Relevant past medical, surgical, family and social history reviewed and updated as indicated. Interim medical history since our last visit reviewed. Allergies and medications reviewed and updated.  Review of Systems  Constitutional: Negative.   Respiratory: Negative.    Cardiovascular: Negative.   Psychiatric/Behavioral:  Positive for dysphoric mood. Negative for agitation, behavioral problems, confusion, decreased concentration, hallucinations, self-injury, sleep disturbance and suicidal ideas. The patient is nervous/anxious. The patient is not hyperactive.     Per HPI unless specifically  indicated above     Objective:    BP (!) 142/100   Pulse 93   Temp 98 F (36.7 C) (Oral)   Ht 5' 1 (1.549 m)   SpO2 97%   BMI 31.29 kg/m   Wt Readings from Last 3 Encounters:  04/09/24 165 lb 9.6 oz (75.1 kg)  11/01/23 166 lb (75.3 kg)  09/21/23 165 lb 14.4 oz (75.3 kg)    Physical Exam Vitals and nursing note reviewed.  Constitutional:      General: She is not in acute distress.    Appearance: Normal appearance. She is not ill-appearing, toxic-appearing or diaphoretic.  HENT:     Head: Normocephalic and atraumatic.     Right Ear: External ear normal.     Left Ear: External ear normal.     Nose: Nose normal.     Mouth/Throat:     Mouth: Mucous membranes are moist.     Pharynx: Oropharynx is clear.  Eyes:     General: No scleral icterus.       Right eye: No discharge.        Left eye: No discharge.     Extraocular Movements: Extraocular movements intact.     Conjunctiva/sclera: Conjunctivae normal.     Pupils: Pupils are equal, round, and reactive to light.  Cardiovascular:     Rate and Rhythm: Normal rate and regular rhythm.     Pulses: Normal pulses.     Heart sounds: Normal heart sounds. No murmur heard.    No friction rub. No gallop.  Pulmonary:     Effort: Pulmonary effort is normal. No respiratory distress.     Breath sounds: Normal breath sounds. No stridor. No wheezing, rhonchi or rales.  Chest:     Chest wall: No tenderness.  Musculoskeletal:        General: Normal range of motion.     Cervical back: Normal range of motion and neck supple.  Skin:    General: Skin is warm and dry.     Capillary Refill: Capillary refill takes less than 2 seconds.     Coloration: Skin is not jaundiced or pale.     Findings: No bruising, erythema, lesion or rash.  Neurological:     General: No focal deficit present.     Mental Status: She is alert and oriented to person, place, and time. Mental status is at baseline.  Psychiatric:        Mood and Affect: Mood normal.         Behavior: Behavior normal.        Thought Content: Thought content normal.        Judgment: Judgment normal.     Results for orders placed or performed in visit on 04/11/24  CBC with Differential/Platelet   Collection Time: 04/11/24  8:24 AM  Result Value Ref Range   WBC 8.0 3.4 - 10.8 x10E3/uL   RBC  4.85 3.77 - 5.28 x10E6/uL   Hemoglobin 14.4 11.1 - 15.9 g/dL   Hematocrit 56.0 65.9 - 46.6 %   MCV 91 79 - 97 fL   MCH 29.7 26.6 - 33.0 pg   MCHC 32.8 31.5 - 35.7 g/dL   RDW 86.6 88.2 - 84.5 %   Platelets 318 150 - 450 x10E3/uL   Neutrophils 43 Not Estab. %   Lymphs 47 Not Estab. %   Monocytes 6 Not Estab. %   Eos 3 Not Estab. %   Basos 1 Not Estab. %   Neutrophils Absolute 3.4 1.4 - 7.0 x10E3/uL   Lymphocytes Absolute 3.8 (H) 0.7 - 3.1 x10E3/uL   Monocytes Absolute 0.5 0.1 - 0.9 x10E3/uL   EOS (ABSOLUTE) 0.2 0.0 - 0.4 x10E3/uL   Basophils Absolute 0.1 0.0 - 0.2 x10E3/uL   Immature Granulocytes 0 Not Estab. %   Immature Grans (Abs) 0.0 0.0 - 0.1 x10E3/uL  Comprehensive metabolic panel with GFR   Collection Time: 04/11/24  8:24 AM  Result Value Ref Range   Glucose 92 70 - 99 mg/dL   BUN 18 6 - 24 mg/dL   Creatinine, Ser 9.15 0.57 - 1.00 mg/dL   eGFR 80 >40 fO/fpw/8.26   BUN/Creatinine Ratio 21 9 - 23   Sodium 144 134 - 144 mmol/L   Potassium 4.1 3.5 - 5.2 mmol/L   Chloride 102 96 - 106 mmol/L   CO2 23 20 - 29 mmol/L   Calcium  9.6 8.7 - 10.2 mg/dL   Total Protein 6.7 6.0 - 8.5 g/dL   Albumin 4.5 3.8 - 4.9 g/dL   Globulin, Total 2.2 1.5 - 4.5 g/dL   Bilirubin Total 0.2 0.0 - 1.2 mg/dL   Alkaline Phosphatase 72 49 - 135 IU/L   AST 20 0 - 40 IU/L   ALT 19 0 - 32 IU/L  Hepatitis B surface antibody,quantitative   Collection Time: 04/11/24  8:24 AM  Result Value Ref Range   Hepatitis B Surf Ab Quant <3.5 (L) Immunity>10 mIU/mL  Lipid Panel w/o Chol/HDL Ratio   Collection Time: 04/11/24  8:24 AM  Result Value Ref Range   Cholesterol, Total 357 (H) 100 - 199 mg/dL    Triglycerides 807 (H) 0 - 149 mg/dL   HDL 69 >60 mg/dL   VLDL Cholesterol Cal 38 5 - 40 mg/dL   LDL Chol Calc (NIH) 749 (H) 0 - 99 mg/dL   LDL CALC COMMENT: Comment   TSH   Collection Time: 04/11/24  8:24 AM  Result Value Ref Range   TSH 1.500 0.450 - 4.500 uIU/mL      Assessment & Plan:   Problem List Items Addressed This Visit       Cardiovascular and Mediastinum   Essential hypertension, benign - Primary   Improved, but still high. Will increase her losartan  to 50mg  and recheck in about 4-6 weeks. Call with any concerns.       Relevant Medications   losartan  (COZAAR ) 50 MG tablet   Other Relevant Orders   Comprehensive metabolic panel with GFR     Other   Anxiety disorder   Will increase her cymbalta  to 30mg  and recheck in 4-6 weeks. Call with any concerns. Continue to monitor.       Relevant Medications   DULoxetine  (CYMBALTA ) 30 MG capsule   Mixed hyperlipidemia   Tolerating her atorvastatin . Feeling well. Rechecking her labs today. Call with any concerns.       Relevant Medications   losartan  (COZAAR )  50 MG tablet   Other Relevant Orders   Lipid Panel w/o Chol/HDL Ratio   Comprehensive metabolic panel with GFR   Depression, recurrent   Will increase her cymbalta  to 30mg  and recheck in 4-6 weeks. Call with any concerns. Continue to monitor.       Relevant Medications   DULoxetine  (CYMBALTA ) 30 MG capsule   Other Visit Diagnoses       Not immune to hepatitis B virus       Hep B #1 given today. Will give #2 next visit.   Relevant Orders   Heplisav-B  (HepB-CPG) Vaccine        Follow up plan: Return 4-6 weeks.

## 2024-05-07 NOTE — Assessment & Plan Note (Signed)
 Tolerating her atorvastatin . Feeling well. Rechecking her labs today. Call with any concerns.

## 2024-05-08 LAB — COMPREHENSIVE METABOLIC PANEL WITH GFR
ALT: 17 IU/L (ref 0–32)
AST: 22 IU/L (ref 0–40)
Albumin: 4.4 g/dL (ref 3.8–4.9)
Alkaline Phosphatase: 62 IU/L (ref 49–135)
BUN/Creatinine Ratio: 18 (ref 9–23)
BUN: 15 mg/dL (ref 6–24)
Bilirubin Total: 0.3 mg/dL (ref 0.0–1.2)
CO2: 23 mmol/L (ref 20–29)
Calcium: 10.4 mg/dL — ABNORMAL HIGH (ref 8.7–10.2)
Chloride: 97 mmol/L (ref 96–106)
Creatinine, Ser: 0.85 mg/dL (ref 0.57–1.00)
Globulin, Total: 2.2 g/dL (ref 1.5–4.5)
Glucose: 81 mg/dL (ref 70–99)
Potassium: 3.7 mmol/L (ref 3.5–5.2)
Sodium: 139 mmol/L (ref 134–144)
Total Protein: 6.6 g/dL (ref 6.0–8.5)
eGFR: 79 mL/min/1.73 (ref >59)

## 2024-05-08 LAB — LIPID PANEL W/O CHOL/HDL RATIO
Cholesterol, Total: 309 mg/dL — ABNORMAL HIGH (ref 100–199)
HDL: 64 mg/dL (ref >39)
LDL Chol Calc (NIH): 189 mg/dL — ABNORMAL HIGH (ref 0–99)
Triglycerides: 287 mg/dL — ABNORMAL HIGH (ref 0–149)
VLDL Cholesterol Cal: 56 mg/dL — ABNORMAL HIGH (ref 5–40)

## 2024-05-10 MED ORDER — ROSUVASTATIN CALCIUM 20 MG PO TABS: 20.0000 mg | ORAL_TABLET | Freq: Every day | ORAL | 1 refills | Status: AC

## 2024-06-28 ENCOUNTER — Ambulatory Visit: Admitting: Family Medicine

## 2024-06-28 ENCOUNTER — Encounter: Payer: Self-pay | Admitting: Family Medicine

## 2024-06-28 VITALS — BP 146/101 | HR 94 | Temp 97.9°F | Resp 16 | Ht 61.0 in | Wt 174.4 lb

## 2024-06-28 DIAGNOSIS — I10 Essential (primary) hypertension: Secondary | ICD-10-CM

## 2024-06-28 DIAGNOSIS — F339 Major depressive disorder, recurrent, unspecified: Secondary | ICD-10-CM

## 2024-06-28 DIAGNOSIS — E782 Mixed hyperlipidemia: Secondary | ICD-10-CM

## 2024-06-28 DIAGNOSIS — F411 Generalized anxiety disorder: Secondary | ICD-10-CM

## 2024-06-28 MED ORDER — CARIPRAZINE HCL 1.5 MG PO CAPS: 1.5000 mg | ORAL_CAPSULE | ORAL | 3 refills | Status: AC

## 2024-06-28 MED ORDER — LOSARTAN POTASSIUM 100 MG PO TABS: 100.0000 mg | ORAL_TABLET | Freq: Every day | ORAL | 2 refills | Status: AC

## 2024-06-28 NOTE — Progress Notes (Signed)
 "  BP (!) 146/101   Pulse 94   Temp 97.9 F (36.6 C) (Oral)   Resp 16   Ht 5' 1 (1.549 m)   Wt 174 lb 6.4 oz (79.1 kg)   SpO2 97%   BMI 32.95 kg/m    Subjective:    Patient ID: VALI CAPANO, female    DOB: 1965/06/01, 59 y.o.   MRN: [id number]  HPI: Lindsey Drake is a 59 y.o. female  Chief Complaint  Patient presents with   Hypertension   Depression   ANXIETY/STRESS Duration: chronic Status:uncontrolled Anxious mood: yes  Excessive worrying: yes Irritability: yes  Sweating: yes Nausea: yes Palpitations:yes Hyperventilation: no Panic attacks: no Agoraphobia: no  Obscessions/compulsions: yes Depressed mood: yes    06/28/2024    2:48 PM 05/07/2024    2:55 PM 04/09/2024    2:38 PM 05/28/2022   10:07 AM 04/08/2022    3:21 PM  Depression screen PHQ 2/9  Decreased Interest 3 3 3 3 3   Down, Depressed, Hopeless 3 3 2 3 3   PHQ - 2 Score 6 6 5 6 6   Altered sleeping 3 3 3 3 3   Tired, decreased energy 3 3 3 3 3   Change in appetite 3 0 2 3 3   Feeling bad or failure about yourself  0 0 0 0 0  Trouble concentrating 3 3 3 2 1   Moving slowly or fidgety/restless 0 0 0 0 3  Suicidal thoughts 0 0 0 0 0  PHQ-9 Score 18 15 16 17  19    Difficult doing work/chores Somewhat difficult Not difficult at all Somewhat difficult Extremely dIfficult Extremely dIfficult     Data saved with a previous flowsheet row definition   Anhedonia: no Weight changes: yes Insomnia: yes   Hypersomnia: yes Fatigue/loss of energy: yes Feelings of worthlessness: yes Feelings of guilt: yes Impaired concentration/indecisiveness: yes Suicidal ideations: no  Crying spells: no Recent Stressors/Life Changes: no   Relationship problems: no   Family stress: no     Financial stress: no    Job stress: no    Recent death/loss: no  HYPERTENSION / HYPERLIPIDEMIA Satisfied with current treatment? no Duration of hypertension: chronic BP monitoring frequency: not checking BP medication side effects:  no Past BP meds: losartan  Duration of hyperlipidemia: chronic Cholesterol medication side effects: no Cholesterol supplements: none Past cholesterol medications: crestor  Medication compliance: excellent compliance Aspirin : no Recent stressors: no Recurrent headaches: no Visual changes: no Palpitations: no Dyspnea: no Chest pain: no Lower extremity edema: no Dizzy/lightheaded: no  Relevant past medical, surgical, family and social history reviewed and updated as indicated. Interim medical history since our last visit reviewed. Allergies and medications reviewed and updated.  Review of Systems  Constitutional: Negative.   Respiratory: Negative.    Cardiovascular: Negative.   Genitourinary: Negative.   Psychiatric/Behavioral:  Positive for agitation, decreased concentration, dysphoric mood and sleep disturbance. Negative for behavioral problems, confusion, hallucinations, self-injury and suicidal ideas. The patient is nervous/anxious. The patient is not hyperactive.     Per HPI unless specifically indicated above     Objective:    BP (!) 146/101   Pulse 94   Temp 97.9 F (36.6 C) (Oral)   Resp 16   Ht 5' 1 (1.549 m)   Wt 174 lb 6.4 oz (79.1 kg)   SpO2 97%   BMI 32.95 kg/m   Wt Readings from Last 3 Encounters:  06/28/24 174 lb 6.4 oz (79.1 kg)  04/09/24 165 lb 9.6 oz (75.1  kg)  11/01/23 166 lb (75.3 kg)    Physical Exam Vitals and nursing note reviewed.  Constitutional:      General: She is not in acute distress.    Appearance: Normal appearance. She is not ill-appearing, toxic-appearing or diaphoretic.  HENT:     Head: Normocephalic and atraumatic.     Right Ear: External ear normal.     Left Ear: External ear normal.     Nose: Nose normal.     Mouth/Throat:     Mouth: Mucous membranes are moist.     Pharynx: Oropharynx is clear.  Eyes:     General: No scleral icterus.       Right eye: No discharge.        Left eye: No discharge.     Extraocular  Movements: Extraocular movements intact.     Conjunctiva/sclera: Conjunctivae normal.     Pupils: Pupils are equal, round, and reactive to light.  Cardiovascular:     Rate and Rhythm: Normal rate and regular rhythm.     Pulses: Normal pulses.     Heart sounds: Normal heart sounds. No murmur heard.    No friction rub. No gallop.  Pulmonary:     Effort: Pulmonary effort is normal. No respiratory distress.     Breath sounds: Normal breath sounds. No stridor. No wheezing, rhonchi or rales.  Chest:     Chest wall: No tenderness.  Musculoskeletal:        General: Normal range of motion.     Cervical back: Normal range of motion and neck supple.  Skin:    General: Skin is warm and dry.     Capillary Refill: Capillary refill takes less than 2 seconds.     Coloration: Skin is not jaundiced or pale.     Findings: No bruising, erythema, lesion or rash.  Neurological:     General: No focal deficit present.     Mental Status: She is alert and oriented to person, place, and time. Mental status is at baseline.  Psychiatric:        Mood and Affect: Mood is anxious.        Behavior: Behavior normal.        Thought Content: Thought content normal.        Judgment: Judgment normal.     Results for orders placed or performed in visit on 05/07/24  Lipid Panel w/o Chol/HDL Ratio   Collection Time: 05/07/24  3:20 PM  Result Value Ref Range   Cholesterol, Total 309 (H) 100 - 199 mg/dL   Triglycerides 712 (H) 0 - 149 mg/dL   HDL 64 >60 mg/dL   VLDL Cholesterol Cal 56 (H) 5 - 40 mg/dL   LDL Chol Calc (NIH) 810 (H) 0 - 99 mg/dL   LDL CALC COMMENT: Comment   Comprehensive metabolic panel with GFR   Collection Time: 05/07/24  3:20 PM  Result Value Ref Range   Glucose 81 70 - 99 mg/dL   BUN 15 6 - 24 mg/dL   Creatinine, Ser 9.14 0.57 - 1.00 mg/dL   eGFR 79 >40 fO/fpw/8.26   BUN/Creatinine Ratio 18 9 - 23   Sodium 139 134 - 144 mmol/L   Potassium 3.7 3.5 - 5.2 mmol/L   Chloride 97 96 - 106  mmol/L   CO2 23 20 - 29 mmol/L   Calcium  10.4 (H) 8.7 - 10.2 mg/dL   Total Protein 6.6 6.0 - 8.5 g/dL   Albumin 4.4 3.8 - 4.9 g/dL  Globulin, Total 2.2 1.5 - 4.5 g/dL   Bilirubin Total 0.3 0.0 - 1.2 mg/dL   Alkaline Phosphatase 62 49 - 135 IU/L   AST 22 0 - 40 IU/L   ALT 17 0 - 32 IU/L      Assessment & Plan:   Problem List Items Addressed This Visit       Cardiovascular and Mediastinum   Essential hypertension, benign - Primary   Not under good control. Will increase her losartan  to 100mg  and recheck in 1 month. Call with any concerns.       Relevant Medications   losartan  (COZAAR ) 100 MG tablet   Other Relevant Orders   Comprehensive metabolic panel with GFR     Other   Anxiety disorder   Not doing well. If anything worse on cymbalta . Concern for hypomania. Will start on vraylar  and recheck in 2 weeks. Call with any concerns.       Mixed hyperlipidemia   Tolerating medicine well. Rechecking labs today. Await results.       Relevant Medications   losartan  (COZAAR ) 100 MG tablet   Other Relevant Orders   Comprehensive metabolic panel with GFR   Lipid Panel w/o Chol/HDL Ratio   Depression, recurrent   Not doing well. If anything worse on cymbalta . Concern for hypomania. Will start on vraylar  and recheck in 2 weeks. Call with any concerns.         Follow up plan: Return 2-3 weeks.      "

## 2024-06-28 NOTE — Assessment & Plan Note (Signed)
 Not doing well. If anything worse on cymbalta . Concern for hypomania. Will start on vraylar  and recheck in 2 weeks. Call with any concerns.

## 2024-06-28 NOTE — Assessment & Plan Note (Signed)
 Tolerating medicine well. Rechecking labs today. Await results.

## 2024-06-28 NOTE — Assessment & Plan Note (Signed)
 Not under good control. Will increase her losartan  to 100mg  and recheck in 1 month. Call with any concerns.

## 2024-06-29 LAB — LIPID PANEL W/O CHOL/HDL RATIO
Cholesterol, Total: 289 mg/dL — ABNORMAL HIGH (ref 100–199)
HDL: 65 mg/dL
LDL Chol Calc (NIH): 163 mg/dL — ABNORMAL HIGH (ref 0–99)
Triglycerides: 328 mg/dL — ABNORMAL HIGH (ref 0–149)
VLDL Cholesterol Cal: 61 mg/dL — ABNORMAL HIGH (ref 5–40)

## 2024-06-29 LAB — COMPREHENSIVE METABOLIC PANEL WITH GFR
ALT: 22 [IU]/L (ref 0–32)
AST: 22 [IU]/L (ref 0–40)
Albumin: 4.3 g/dL (ref 3.8–4.9)
Alkaline Phosphatase: 71 [IU]/L (ref 49–135)
BUN/Creatinine Ratio: 25 — ABNORMAL HIGH (ref 9–23)
BUN: 19 mg/dL (ref 6–24)
Bilirubin Total: 0.2 mg/dL (ref 0.0–1.2)
CO2: 24 mmol/L (ref 20–29)
Calcium: 10.6 mg/dL — ABNORMAL HIGH (ref 8.7–10.2)
Chloride: 100 mmol/L (ref 96–106)
Creatinine, Ser: 0.75 mg/dL (ref 0.57–1.00)
Globulin, Total: 2.2 g/dL (ref 1.5–4.5)
Glucose: 92 mg/dL (ref 70–99)
Potassium: 4.3 mmol/L (ref 3.5–5.2)
Sodium: 139 mmol/L (ref 134–144)
Total Protein: 6.5 g/dL (ref 6.0–8.5)
eGFR: 92 mL/min/{1.73_m2}

## 2024-07-12 ENCOUNTER — Ambulatory Visit: Admitting: Family Medicine
